# Patient Record
Sex: Female | Born: 1955 | Race: White | Hispanic: No | Smoking: Never smoker
Health system: Southern US, Community
[De-identification: ages and names within clinical notes are randomized; demographics above are authoritative.]

## PROBLEM LIST (undated history)

## (undated) ENCOUNTER — Emergency Department (HOSPITAL_COMMUNITY): Admission: EM | Source: Home / Self Care

## (undated) DIAGNOSIS — F32A Depression, unspecified: Secondary | ICD-10-CM

## (undated) DIAGNOSIS — K5790 Diverticulosis of intestine, part unspecified, without perforation or abscess without bleeding: Secondary | ICD-10-CM

## (undated) DIAGNOSIS — R32 Unspecified urinary incontinence: Secondary | ICD-10-CM

## (undated) DIAGNOSIS — D509 Iron deficiency anemia, unspecified: Secondary | ICD-10-CM

## (undated) DIAGNOSIS — F329 Major depressive disorder, single episode, unspecified: Secondary | ICD-10-CM

## (undated) DIAGNOSIS — R011 Cardiac murmur, unspecified: Secondary | ICD-10-CM

## (undated) DIAGNOSIS — K219 Gastro-esophageal reflux disease without esophagitis: Secondary | ICD-10-CM

## (undated) DIAGNOSIS — M1990 Unspecified osteoarthritis, unspecified site: Secondary | ICD-10-CM

## (undated) DIAGNOSIS — L4050 Arthropathic psoriasis, unspecified: Secondary | ICD-10-CM

## (undated) DIAGNOSIS — Z8489 Family history of other specified conditions: Secondary | ICD-10-CM

## (undated) DIAGNOSIS — R51 Headache: Secondary | ICD-10-CM

## (undated) DIAGNOSIS — F988 Other specified behavioral and emotional disorders with onset usually occurring in childhood and adolescence: Secondary | ICD-10-CM

## (undated) DIAGNOSIS — G4700 Insomnia, unspecified: Secondary | ICD-10-CM

## (undated) DIAGNOSIS — R519 Headache, unspecified: Secondary | ICD-10-CM

## (undated) DIAGNOSIS — F1011 Alcohol abuse, in remission: Secondary | ICD-10-CM

## (undated) DIAGNOSIS — F419 Anxiety disorder, unspecified: Secondary | ICD-10-CM

## (undated) HISTORY — DX: Iron deficiency anemia, unspecified: D50.9

## (undated) HISTORY — PX: TONSILLECTOMY: SUR1361

## (undated) HISTORY — DX: Arthropathic psoriasis, unspecified: L40.50

## (undated) HISTORY — DX: Diverticulosis of intestine, part unspecified, without perforation or abscess without bleeding: K57.90

## (undated) HISTORY — DX: Other specified behavioral and emotional disorders with onset usually occurring in childhood and adolescence: F98.8

## (undated) HISTORY — DX: Insomnia, unspecified: G47.00

## (undated) HISTORY — DX: Unspecified osteoarthritis, unspecified site: M19.90

---
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
## 1982-11-27 HISTORY — PX: CHOLECYSTECTOMY: SHX55

## 1999-10-25 ENCOUNTER — Other Ambulatory Visit: Admission: RE | Admit: 1999-10-25 | Discharge: 1999-10-25 | Payer: Self-pay | Admitting: Psychiatry

## 2000-01-04 ENCOUNTER — Other Ambulatory Visit: Admission: RE | Admit: 2000-01-04 | Discharge: 2000-01-04 | Payer: Self-pay | Admitting: *Deleted

## 2000-03-31 ENCOUNTER — Emergency Department (HOSPITAL_COMMUNITY): Admission: EM | Admit: 2000-03-31 | Discharge: 2000-03-31 | Payer: Self-pay | Admitting: Emergency Medicine

## 2000-11-27 HISTORY — PX: GASTRIC BYPASS: SHX52

## 2001-04-18 ENCOUNTER — Ambulatory Visit: Admission: RE | Admit: 2001-04-18 | Discharge: 2001-04-18 | Payer: Self-pay | Admitting: *Deleted

## 2001-04-18 ENCOUNTER — Other Ambulatory Visit: Admission: RE | Admit: 2001-04-18 | Discharge: 2001-04-18 | Payer: Self-pay | Admitting: Obstetrics and Gynecology

## 2002-05-07 ENCOUNTER — Other Ambulatory Visit: Admission: RE | Admit: 2002-05-07 | Discharge: 2002-05-07 | Payer: Self-pay | Admitting: Obstetrics and Gynecology

## 2003-05-29 ENCOUNTER — Other Ambulatory Visit: Admission: RE | Admit: 2003-05-29 | Discharge: 2003-05-29 | Payer: Self-pay | Admitting: Obstetrics and Gynecology

## 2003-11-28 HISTORY — PX: COLONOSCOPY: SHX174

## 2004-07-26 ENCOUNTER — Other Ambulatory Visit: Admission: RE | Admit: 2004-07-26 | Discharge: 2004-07-26 | Payer: Self-pay | Admitting: Obstetrics and Gynecology

## 2005-09-27 ENCOUNTER — Other Ambulatory Visit: Admission: RE | Admit: 2005-09-27 | Discharge: 2005-09-27 | Payer: Self-pay | Admitting: Obstetrics and Gynecology

## 2005-10-06 ENCOUNTER — Ambulatory Visit: Payer: Self-pay | Admitting: Internal Medicine

## 2005-11-27 HISTORY — PX: HYSTEROSCOPY W/D&C: SHX1775

## 2005-12-09 ENCOUNTER — Emergency Department (HOSPITAL_COMMUNITY): Admission: EM | Admit: 2005-12-09 | Discharge: 2005-12-10 | Payer: Self-pay | Admitting: Emergency Medicine

## 2005-12-12 ENCOUNTER — Encounter (HOSPITAL_COMMUNITY): Admission: RE | Admit: 2005-12-12 | Discharge: 2006-03-12 | Payer: Self-pay | Admitting: Emergency Medicine

## 2005-12-16 ENCOUNTER — Emergency Department (HOSPITAL_COMMUNITY): Admission: EM | Admit: 2005-12-16 | Discharge: 2005-12-16 | Payer: Self-pay | Admitting: *Deleted

## 2005-12-29 ENCOUNTER — Ambulatory Visit (HOSPITAL_COMMUNITY): Admission: RE | Admit: 2005-12-29 | Discharge: 2005-12-29 | Payer: Self-pay | Admitting: Obstetrics and Gynecology

## 2005-12-29 ENCOUNTER — Encounter (INDEPENDENT_AMBULATORY_CARE_PROVIDER_SITE_OTHER): Payer: Self-pay | Admitting: *Deleted

## 2006-03-09 ENCOUNTER — Ambulatory Visit: Payer: Self-pay | Admitting: Internal Medicine

## 2006-03-23 ENCOUNTER — Ambulatory Visit: Payer: Self-pay | Admitting: Internal Medicine

## 2006-03-23 ENCOUNTER — Encounter (INDEPENDENT_AMBULATORY_CARE_PROVIDER_SITE_OTHER): Payer: Self-pay | Admitting: *Deleted

## 2006-12-05 ENCOUNTER — Other Ambulatory Visit: Admission: RE | Admit: 2006-12-05 | Discharge: 2006-12-05 | Payer: Self-pay | Admitting: Obstetrics & Gynecology

## 2007-12-18 ENCOUNTER — Other Ambulatory Visit: Admission: RE | Admit: 2007-12-18 | Discharge: 2007-12-18 | Payer: Self-pay | Admitting: Family Medicine

## 2011-04-14 NOTE — H&P (Signed)
Jasmine Richard, Jasmine Richard              ACCOUNT NO.:  [id number]   MEDICAL RECORD NO.:  [id number]           PATIENT TYPE:   LOCATION:                               FACILITY:  [facility]   PHYSICIAN:  Guy Sandifer. Henderson Cloud, [name] DATE OF BIRTH:  October 09, 1956   DATE OF ADMISSION:  12/29/2005  DATE OF DISCHARGE:                                HISTORY & PHYSICAL   CHIEF COMPLAINT:  Irregular menses.   HISTORY OF PRESENT ILLNESS:  This patient is a 55 year old married, white  female, G3, P3, who has had irregular menses. Ultrasound on October 25, 2005 revealed uterus measuring 11.1 x 5.6 x 6.2 cm. Right ovary had a 2.1 x  2 cm simple cyst. Sonohysterogram revealed an 8 mm polypoid type mass in the  superior aspect of the endometrial cavity. After discussing the options she  is being admitted for hysteroscopy, D&C. Potential risks and complications  have been reviewed preoperatively.   PAST MEDICAL HISTORY:  1.  Menstrual migraines.  2.  Osteopenia.   PAST SURGICAL HISTORY:  1.  Bariatric surgery.  2.  Cholecystectomy in 1984.  3.  Tonsillectomy in 1960.   OBSTETRIC HISTORY:  Vaginal delivery x1. Cesarean section x2.   FAMILY HISTORY:  Crohn's disease in mother. Cervical cancer in mother.  Tongue cancer in father. Chronic hypertension in father.   MEDICATIONS:  Mobick, Glucosamine, Prilosec, vitamin B12 injections.   ALLERGIES:  No known drug allergies.   SOCIAL HISTORY:  Denies tobacco, alcohol or drug abuse.   REVIEW OF SYSTEMS:  CARDIAC:  Denies chest pain. NEURO:  Denies headache  except for migraine as above. PULMONARY:  Denies chest pain. GI:  Denies  recent change in bowel habits.   PHYSICAL EXAMINATION:  VITAL SIGNS:  Height 5 feet, 5 inches, weight 195  pounds. Blood pressure 100/60.  HEENT:  Without thyromegaly.  LUNGS:  Clear to auscultation.  HEART:  Regular rate and rhythm.  BACK:  Without CVA tenderness.  BREASTS:  Without mass, retraction, discharge.  ABDOMEN:  Soft,  nontender without masses.  PELVIC EXAM:  Vagina, cervix without lesions. Uterus anteverted, upper  normal size, mobile, nontender. Adnexa nontender without masses.  NEUROLOGIC EXAM:  Grossly within normal limits.  EXTREMITIES:  Grossly within normal limits.   ASSESSMENT:  Irregular menses.   PLAN:  Hysteroscopy, D&C.      Guy Sandifer Henderson Cloud, [name]  Electronically Signed     JET/MEDQ  D:  12/27/2005  T:  12/27/2005  Job:  [id number]

## 2011-04-14 NOTE — Op Note (Signed)
Jasmine Richard, Jasmine Richard              ACCOUNT NO.:  [id number]   MEDICAL RECORD NO.:  [id number]          PATIENT TYPE:  AMB   LOCATION:  SDC                           FACILITY:  [facility]   PHYSICIAN:  Guy Sandifer. Henderson Cloud, [name] DATE OF BIRTH:  1956-03-28   DATE OF PROCEDURE:  12/29/2005  DATE OF DISCHARGE:                                 OPERATIVE REPORT   PREOPERATIVE DIAGNOSIS:  Irregular menses.   POSTOPERATIVE DIAGNOSIS:  Irregular menses.  Same.   PROCEDURE:  1  Hysteroscopy with dilatation curettage.  2  1% Xylocaine paracervical block.   SURGEON:  Harold Hedge, MD   ANESTHESIA:  General with LMA.   SPECIMENS:  Endometrial curettings   ESTIMATED BLOOD LOSS:  Minimal.   INDICATIONS AND CONSENT:  The patient is a 55 year old married white female  G3, P3 with irregular menses.  Details dictated in the history and physical.  Hysteroscopy, dilatation curettage has been discussed preoperatively.  Potential risks and complications are reviewed preoperatively including but  limited to infection, uterine perforation and organ damage, bleeding  requiring transfusion of blood products and possible transfusion reaction,  HIV and  hepatitis acquisition, DVT, PE, pneumonia, hysterectomy, recurrent  abnormal bleeding.  All consents are signed and on the chart.   FINDINGS:  Uterine cavity is normal in contour.  No abnormal structures are  noted.  Fallopian tube ostia are identified bilaterally.   PROCEDURE:  The patient is taken to the operating room where she is  identified, placed in dorsosupine position, and general anesthesia via LMA  is induced.  She is then placed in the dorsal lithotomy position where she  is prepped, bladder straight catheterized, and draped in a sterile fashion.  Bivalve speculum is placed in the vagina.  The anterior cervical lip is  injected with 1% Xylocaine and grasped with single-tooth tenaculum.  Paracervical block is placed to in the 2, 4, 5, 7, 8 and 10  o'clock  positions with approximately 20 mL total of 1% Xylocaine.  The cervix is  then gently progressively dilated to 29 dilator.  Diagnostic hysteroscope is  placed in the endocervical canal and advanced under direct visualization  using sorbitol distending media.  The above findings are noted.  Hysteroscope is  withdrawn.  Sharp curettage was carried out.  Reinspection with the  hysteroscope again reveals the cavity to be clean and no abnormal  structures. I&O of sorbitol distending media are 160 mL deficit with part of  that being on the floor; all counts correct.  The patient is awakened, taken  to recovery room in stable condition.      Guy Sandifer Henderson Cloud, [name]  Electronically Signed     JET/MEDQ  D:  12/29/2005  T:  12/29/2005  Job:  [id number]

## 2012-11-25 ENCOUNTER — Encounter (HOSPITAL_BASED_OUTPATIENT_CLINIC_OR_DEPARTMENT_OTHER): Payer: Self-pay | Admitting: *Deleted

## 2012-11-25 NOTE — Progress Notes (Signed)
Pt was widowed 8/13 Daughter to come with her No cardiac or resp problems

## 2012-11-28 ENCOUNTER — Encounter (HOSPITAL_BASED_OUTPATIENT_CLINIC_OR_DEPARTMENT_OTHER): Payer: Self-pay

## 2012-11-28 ENCOUNTER — Ambulatory Visit (HOSPITAL_BASED_OUTPATIENT_CLINIC_OR_DEPARTMENT_OTHER): Admitting: Anesthesiology

## 2012-11-28 ENCOUNTER — Encounter (HOSPITAL_BASED_OUTPATIENT_CLINIC_OR_DEPARTMENT_OTHER)
Admission: RE | Disposition: A | Discharge status: Home / Self Care | Payer: Self-pay | Source: Ambulatory Visit | Attending: Orthopedic Surgery

## 2012-11-28 ENCOUNTER — Ambulatory Visit (HOSPITAL_BASED_OUTPATIENT_CLINIC_OR_DEPARTMENT_OTHER)
Admission: RE | Admit: 2012-11-28 | Discharge: 2012-11-28 | Disposition: A | Discharge status: Home / Self Care | Source: Ambulatory Visit | Attending: Orthopedic Surgery | Admitting: Orthopedic Surgery

## 2012-11-28 ENCOUNTER — Encounter (HOSPITAL_BASED_OUTPATIENT_CLINIC_OR_DEPARTMENT_OTHER): Payer: Self-pay | Admitting: Anesthesiology

## 2012-11-28 DIAGNOSIS — M19071 Primary osteoarthritis, right ankle and foot: Secondary | ICD-10-CM

## 2012-11-28 DIAGNOSIS — K219 Gastro-esophageal reflux disease without esophagitis: Secondary | ICD-10-CM | POA: Insufficient documentation

## 2012-11-28 DIAGNOSIS — M19079 Primary osteoarthritis, unspecified ankle and foot: Secondary | ICD-10-CM | POA: Insufficient documentation

## 2012-11-28 HISTORY — DX: Gastro-esophageal reflux disease without esophagitis: K21.9

## 2012-11-28 HISTORY — PX: TARSAL METATARSAL ARTHRODESIS: SHX2481

## 2012-11-28 LAB — POCT HEMOGLOBIN-HEMACUE: Hemoglobin: 15.1 g/dL — ABNORMAL HIGH (ref 12.0–15.0)

## 2012-11-28 SURGERY — FUSION, TARSOMETATARSAL JOINT
Anesthesia: Regional | Site: Foot | Laterality: Right | Wound class: Clean

## 2012-11-28 MED ORDER — FENTANYL CITRATE 0.05 MG/ML IJ SOLN
50.0000 ug | Freq: Once | INTRAMUSCULAR | Status: AC
  Administered 2012-11-28: 100 ug via INTRAVENOUS

## 2012-11-28 MED ORDER — METAXALONE 800 MG PO TABS: 800.0000 mg | ORAL_TABLET | Freq: Three times a day (TID) | ORAL | Status: DC

## 2012-11-28 MED ORDER — CEFAZOLIN SODIUM-DEXTROSE 2-3 GM-% IV SOLR
2.0000 g | INTRAVENOUS | Status: AC
  Administered 2012-11-28: 2 g via INTRAVENOUS

## 2012-11-28 MED ORDER — OXYCODONE-ACETAMINOPHEN 5-325 MG PO TABS: 1.0000 | ORAL_TABLET | ORAL | Status: DC | PRN

## 2012-11-28 MED ORDER — PROPOFOL 10 MG/ML IV BOLUS
INTRAVENOUS | Status: DC | PRN
  Administered 2012-11-28: 250 mg via INTRAVENOUS
  Administered 2012-11-28: 50 mg via INTRAVENOUS

## 2012-11-28 MED ORDER — VITAMIN C 500 MG PO TABS: 500.0000 mg | ORAL_TABLET | Freq: Every day | ORAL | Status: DC

## 2012-11-28 MED ORDER — MIDAZOLAM HCL 2 MG/2ML IJ SOLN: 1.0000 mg | INTRAMUSCULAR | Status: DC | PRN

## 2012-11-28 MED ORDER — OXYCODONE HCL 5 MG PO TABS: 5.0000 mg | ORAL_TABLET | Freq: Once | ORAL | Status: DC | PRN

## 2012-11-28 MED ORDER — ASPIRIN EC 325 MG PO TBEC: 325.0000 mg | DELAYED_RELEASE_TABLET | Freq: Two times a day (BID) | ORAL | Status: DC

## 2012-11-28 MED ORDER — MIDAZOLAM HCL 2 MG/2ML IJ SOLN
1.0000 mg | INTRAMUSCULAR | Status: DC | PRN
  Administered 2012-11-28: 2 mg via INTRAVENOUS

## 2012-11-28 MED ORDER — SODIUM CHLORIDE 0.9 % IV SOLN: INTRAVENOUS | Status: DC

## 2012-11-28 MED ORDER — BUPIVACAINE-EPINEPHRINE PF 0.5-1:200000 % IJ SOLN
INTRAMUSCULAR | Status: DC | PRN
  Administered 2012-11-28: 30 mL

## 2012-11-28 MED ORDER — FENTANYL CITRATE 0.05 MG/ML IJ SOLN
INTRAMUSCULAR | Status: DC | PRN
  Administered 2012-11-28: 50 ug via INTRAVENOUS
  Administered 2012-11-28 (×2): 25 ug via INTRAVENOUS

## 2012-11-28 MED ORDER — OXYCODONE HCL 5 MG/5ML PO SOLN: 5.0000 mg | Freq: Once | ORAL | Status: DC | PRN

## 2012-11-28 MED ORDER — PROMETHAZINE HCL 25 MG/ML IJ SOLN: 6.2500 mg | INTRAMUSCULAR | Status: DC | PRN

## 2012-11-28 MED ORDER — ONDANSETRON HCL 4 MG/2ML IJ SOLN
INTRAMUSCULAR | Status: DC | PRN
  Administered 2012-11-28: 4 mg via INTRAVENOUS

## 2012-11-28 MED ORDER — DEXAMETHASONE SODIUM PHOSPHATE 4 MG/ML IJ SOLN
INTRAMUSCULAR | Status: DC | PRN
  Administered 2012-11-28: 10 mg via INTRAVENOUS

## 2012-11-28 MED ORDER — LIDOCAINE HCL (CARDIAC) 20 MG/ML IV SOLN
INTRAVENOUS | Status: DC | PRN
  Administered 2012-11-28: 50 mg via INTRAVENOUS

## 2012-11-28 MED ORDER — HYDROMORPHONE HCL PF 1 MG/ML IJ SOLN
0.2500 mg | INTRAMUSCULAR | Status: DC | PRN
  Administered 2012-11-28 (×2): 0.5 mg via INTRAVENOUS

## 2012-11-28 MED ORDER — CHLORHEXIDINE GLUCONATE 4 % EX LIQD
60.0000 mL | Freq: Once | CUTANEOUS | Status: DC
Start: 2012-11-28 — End: 2012-11-28

## 2012-11-28 MED ORDER — ENOXAPARIN SODIUM 30 MG/0.3ML ~~LOC~~ SOLN: 30.0000 mg | Freq: Two times a day (BID) | SUBCUTANEOUS | Status: DC

## 2012-11-28 MED ORDER — FENTANYL CITRATE 0.05 MG/ML IJ SOLN: 50.0000 ug | INTRAMUSCULAR | Status: DC | PRN

## 2012-11-28 MED ORDER — LACTATED RINGERS IV SOLN
INTRAVENOUS | Status: DC
  Administered 2012-11-28 (×3): via INTRAVENOUS

## 2012-11-28 SURGICAL SUPPLY — 75 items
BANDAGE ELASTIC 4 VELCRO ST LF (GAUZE/BANDAGES/DRESSINGS) ×2 IMPLANT
BANDAGE ELASTIC 6 VELCRO ST LF (GAUZE/BANDAGES/DRESSINGS) ×2 IMPLANT
BIT DRILL CANN F/COMP 2.2 (BIT) ×2 IMPLANT
BLADE CCA MICRO SAG (BLADE) ×2 IMPLANT
BLADE OSC/SAG .038X5.5 CUT EDG (BLADE) IMPLANT
BLADE SURG 11 STRL SS (BLADE) ×2 IMPLANT
BLADE SURG 15 STRL LF DISP TIS (BLADE) ×2 IMPLANT
BLADE SURG 15 STRL SS (BLADE) ×2
BRUSH SCRUB EZ PLAIN DRY (MISCELLANEOUS) ×2 IMPLANT
BUR EGG 3PK/BX (BURR) IMPLANT
CLOTH BEACON ORANGE TIMEOUT ST (SAFETY) ×2 IMPLANT
COTTON STERILE ROLL (GAUZE/BANDAGES/DRESSINGS) ×2 IMPLANT
COVER TABLE BACK 60X90 (DRAPES) ×2 IMPLANT
CUFF TOURNIQUET SINGLE 34IN LL (TOURNIQUET CUFF) ×2 IMPLANT
DRAPE EXTREMITY T 121X128X90 (DRAPE) ×2 IMPLANT
DRAPE OEC MINIVIEW 54X84 (DRAPES) ×2 IMPLANT
DRAPE SURG 17X23 STRL (DRAPES) ×2 IMPLANT
DRSG PAD ABDOMINAL 8X10 ST (GAUZE/BANDAGES/DRESSINGS) ×2 IMPLANT
DURA STEPPER LG (CAST SUPPLIES) IMPLANT
DURA STEPPER MED (CAST SUPPLIES) IMPLANT
DURA STEPPER SML (CAST SUPPLIES) IMPLANT
ELECT REM PT RETURN 9FT ADLT (ELECTROSURGICAL) ×2
ELECTRODE REM PT RTRN 9FT ADLT (ELECTROSURGICAL) ×1 IMPLANT
GAUZE SPONGE 4X4 16PLY XRAY LF (GAUZE/BANDAGES/DRESSINGS) ×2 IMPLANT
GAUZE XEROFORM 1X8 LF (GAUZE/BANDAGES/DRESSINGS) ×2 IMPLANT
GAUZE XEROFORM 5X9 LF (GAUZE/BANDAGES/DRESSINGS) IMPLANT
GLOVE BIO SURGEON STRL SZ7.5 (GLOVE) ×2 IMPLANT
GLOVE BIO SURGEON STRL SZ8 (GLOVE) ×2 IMPLANT
GLOVE BIOGEL PI IND STRL 8 (GLOVE) ×2 IMPLANT
GLOVE BIOGEL PI INDICATOR 8 (GLOVE) ×2
GLOVE SKINSENSE NS SZ7.0 (GLOVE) ×1
GLOVE SKINSENSE STRL SZ7.0 (GLOVE) ×1 IMPLANT
GLOVE SURG SS PI 8.0 STRL IVOR (GLOVE) ×2 IMPLANT
GOWN BRE IMP PREV XXLGXLNG (GOWN DISPOSABLE) ×2 IMPLANT
GOWN PREVENTION PLUS XLARGE (GOWN DISPOSABLE) ×2 IMPLANT
GOWN STRL REIN XL XLG (GOWN DISPOSABLE) ×2 IMPLANT
IMPLANT OP-1 (Orthopedic Implant) ×2 IMPLANT
NDL SUT 6 .5 CRC .975X.05 MAYO (NEEDLE) IMPLANT
NEEDLE HYPO 22GX1.5 SAFETY (NEEDLE) IMPLANT
NEEDLE MAYO TAPER (NEEDLE)
NS IRRIG 1000ML POUR BTL (IV SOLUTION) ×2 IMPLANT
PACK BASIN DAY SURGERY FS (CUSTOM PROCEDURE TRAY) ×2 IMPLANT
PAD CAST 4YDX4 CTTN HI CHSV (CAST SUPPLIES) ×1 IMPLANT
PADDING CAST ABS 4INX4YD NS (CAST SUPPLIES) ×1
PADDING CAST ABS COTTON 4X4 ST (CAST SUPPLIES) ×1 IMPLANT
PADDING CAST COTTON 4X4 STRL (CAST SUPPLIES) ×1
PENCIL BUTTON HOLSTER BLD 10FT (ELECTRODE) ×2 IMPLANT
PLATE LP STR 3.0MM 2H (Plate) ×2 IMPLANT
PLATE LP STR 3.0MM 4H (Plate) ×2 IMPLANT
SCREW CORTICAL 3MMX16MM (Screw) ×2 IMPLANT
SCREW LOCKING 3MMX12MM (Screw) ×2 IMPLANT
SCREW LP CORT 3.0X20 (Screw) ×2 IMPLANT
SCREW LP CORT 3.0X28MM (Screw) ×2 IMPLANT
SCREW LP CORT 3.0X34MM (Screw) ×2 IMPLANT
SCREW LP CORT 3.0X36MM (Screw) ×2 IMPLANT
SCREW LP LOCK 3.0X36MM (Screw) ×2 IMPLANT
SHEET MEDIUM DRAPE 40X70 STRL (DRAPES) ×2 IMPLANT
SPLINT FAST PLASTER 5X30 (CAST SUPPLIES) ×1
SPLINT PLASTER CAST FAST 5X30 (CAST SUPPLIES) ×1 IMPLANT
SPONGE GAUZE 4X4 12PLY (GAUZE/BANDAGES/DRESSINGS) ×2 IMPLANT
STOCKINETTE 6  STRL (DRAPES) ×1
STOCKINETTE 6 STRL (DRAPES) ×1 IMPLANT
SUCTION FRAZIER TIP 10 FR DISP (SUCTIONS) ×2 IMPLANT
SUT ETHILON 4 0 PS 2 18 (SUTURE) ×2 IMPLANT
SUT VIC AB 2-0 PS2 27 (SUTURE) IMPLANT
SUT VIC AB 3-0 PS1 18 (SUTURE) ×1
SUT VIC AB 3-0 PS1 18XBRD (SUTURE) ×1 IMPLANT
SYR BULB 3OZ (MISCELLANEOUS) ×2 IMPLANT
SYR CONTROL 10ML LL (SYRINGE) IMPLANT
TOWEL OR 17X24 6PK STRL BLUE (TOWEL DISPOSABLE) ×2 IMPLANT
TOWEL OR 17X26 10 PK STRL BLUE (TOWEL DISPOSABLE) ×2 IMPLANT
TOWEL OR NON WOVEN STRL DISP B (DISPOSABLE) ×2 IMPLANT
TUBE CONNECTING 20X1/4 (TUBING) ×2 IMPLANT
UNDERPAD 30X30 INCONTINENT (UNDERPADS AND DIAPERS) ×2 IMPLANT
WATER STERILE IRR 1000ML POUR (IV SOLUTION) ×2 IMPLANT

## 2012-11-28 NOTE — Progress Notes (Signed)
Assisted Dr. Hodierne with right, ultrasound guided, femoral nerve block. Side rails up, monitors on throughout procedure. See vital signs in flow sheet. Tolerated Procedure well. 

## 2012-11-28 NOTE — Transfer of Care (Signed)
Immediate Anesthesia Transfer of Care Note  Patient: Jasmine Richard  Procedure(s) Performed: Procedure(s) (LRB) with comments: TARSAL METATARSAL FUSION (Right) - right 2nd and 3rd TMT joint fusion local bone graft stress x ray foot   Patient Location: PACU  Anesthesia Type:GA combined with regional for post-op pain  Level of Consciousness: awake, alert  and oriented  Airway & Oxygen Therapy: Patient Spontanous Breathing and Patient connected to face mask oxygen  Post-op Assessment: Report given to PACU RN and Post -op Vital signs reviewed and stable  Post vital signs: Reviewed and stable  Complications: No apparent anesthesia complications

## 2012-11-28 NOTE — Anesthesia Postprocedure Evaluation (Signed)
Anesthesia Post Note  Patient: Jasmine Richard  Procedure(s) Performed: Procedure(s) (LRB): TARSAL METATARSAL FUSION (Right)  Anesthesia type: General  Patient location: PACU  Post pain: Pain level controlled and Adequate analgesia  Post assessment: Post-op Vital signs reviewed, Patient's Cardiovascular Status Stable, Respiratory Function Stable, Patent Airway and Pain level controlled  Last Vitals:  Filed Vitals:   11/28/12 1645  BP: 122/59  Pulse: 85  Temp:   Resp: 15    Post vital signs: Reviewed and stable  Level of consciousness: awake, alert  and oriented  Complications: No apparent anesthesia complications

## 2012-11-28 NOTE — Anesthesia Preprocedure Evaluation (Signed)
Anesthesia Evaluation  Patient identified by MRN, date of birth, ID band Patient awake    Reviewed: Allergy & Precautions, H&P , NPO status , Patient's Chart, lab work & pertinent test results  Airway Mallampati: I TM Distance: >3 FB Neck ROM: Full    Dental   Pulmonary  breath sounds clear to auscultation        Cardiovascular Rhythm:Regular Rate:Normal     Neuro/Psych    GI/Hepatic GERD-  ,S/p gastric bypass   Endo/Other    Renal/GU      Musculoskeletal   Abdominal   Peds  Hematology   Anesthesia Other Findings   Reproductive/Obstetrics                           Anesthesia Physical Anesthesia Plan  ASA: II  Anesthesia Plan: General   Post-op Pain Management:    Induction: Intravenous  Airway Management Planned: LMA  Additional Equipment:   Intra-op Plan:   Post-operative Plan: Extubation in OR  Informed Consent: I have reviewed the patients History and Physical, chart, labs and discussed the procedure including the risks, benefits and alternatives for the proposed anesthesia with the patient or authorized representative who has indicated his/her understanding and acceptance.     Plan Discussed with: CRNA and Surgeon  Anesthesia Plan Comments:         Anesthesia Quick Evaluation

## 2012-11-28 NOTE — Brief Op Note (Signed)
11/28/2012  3:58 PM  PATIENT:  Jasmine Richard  57 y.o. 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  PROCEDURE:  Procedure(s) (LRB) with comments: TARSAL METATARSAL FUSION (Right) - right 2nd and 3rd TMT joint fusion local bone graft stress x ray foot   SURGEON:  Surgeon(s) and Role:    * Sherri Rad, MD - Primary  PHYSICIAN ASSISTANT: Rexene Edison, PAC   ASSISTANTS: Rexene Edison, [facility]    ANESTHESIA:   general  EBL:  Total I/O In: 1000 [I.V.:1000] Out: -   BLOOD ADMINISTERED:none  DRAINS: none   LOCAL MEDICATIONS USED:  NONE  SPECIMEN:  No Specimen  DISPOSITION OF SPECIMEN:  N/A  COUNTS:  YES  TOURNIQUET:   Total Tourniquet Time Documented: Thigh (Right) - 95 minutes  DICTATION: .Other Dictation: Dictation Number [id number]  PLAN OF CARE: Discharge to home after PACU  PATIENT DISPOSITION:  PACU - hemodynamically stable.   Delay start of Pharmacological VTE agent (>24hrs) due to surgical blood loss or risk of bleeding: no

## 2012-11-28 NOTE — Anesthesia Procedure Notes (Addendum)
Anesthesia Regional Block:  Popliteal block  Pre-Anesthetic Checklist: ,, timeout performed, Correct Patient, Correct Site, Correct Laterality, Correct Procedure, Correct Position, site marked, Risks and benefits discussed,  Surgical consent,  Pre-op evaluation,  At surgeon's request and post-op pain management  Laterality: Right  Prep: chloraprep       Needles:  Injection technique: Single-shot  Needle Type: Echogenic Stimulator Needle          Additional Needles:  Procedures: ultrasound guided (picture in chart) and nerve stimulator Popliteal block  Nerve Stimulator or Paresthesia:  Response: plantar flexion of foot, 0.45 mA,   Additional Responses:   Narrative:  Start time: 11/28/2012 12:59 PM End time: 11/28/2012 1:13 PM Injection made incrementally with aspirations every 5 mL.  Performed by: Personally  Anesthesiologist: Dr Chaney Malling  Additional Notes: Functioning IV was confirmed and monitors were applied.  A 90mm 21ga Arrow echogenic stimulator needle was used. Sterile prep and drape,hand hygiene and sterile gloves were used.  Negative aspiration and negative test dose prior to incremental administration of local anesthetic. The patient tolerated the procedure well.  Ultrasound guidance: relevent anatomy identified, needle position confirmed, local anesthetic spread visualized around nerve(s), vascular puncture avoided.  Image printed for medical record.   Popliteal block Procedure Name: LMA Insertion Date/Time: 11/28/2012 1:38 PM Performed by: Gar Gibbon Pre-anesthesia Checklist: Patient identified, Emergency Drugs available, Suction available and Patient being monitored Patient Re-evaluated:Patient Re-evaluated prior to inductionOxygen Delivery Method: Circle System Utilized Preoxygenation: Pre-oxygenation with 100% oxygen Intubation Type: IV induction Ventilation: Mask ventilation without difficulty LMA: LMA inserted LMA Size: 4.0 Number of attempts:  1 Airway Equipment and Method: bite block Placement Confirmation: positive ETCO2 Tube secured with: Tape Dental Injury: Teeth and Oropharynx as per pre-operative assessment

## 2012-11-28 NOTE — H&P (Signed)
  H&P documentation: Placed to be scanned history and physical exam in chart.  -History and Physical Reviewed  -Patient has been re-examined  -No change in the plan of care  Jasmine Richard A  

## 2012-11-29 NOTE — Addendum Note (Signed)
Addendum  created 11/29/12 0827 by Jewel Baize Lalla Laham, CRNA   Modules edited:Charges VN

## 2012-11-29 NOTE — Op Note (Signed)
Jasmine Richard, Jasmine Richard              ACCOUNT NO.:  [id number]  MEDICAL RECORD NO.:  [id number]  LOCATION:                                 FACILITY:  PHYSICIAN:  Leonides Grills, [name]     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  OPERATION: 1. Right second and third tarsometatarsal joint fusions. 2. Right local bone graft. 3. Stress x-rays of right foot.  ANESTHESIA:  General.  SURGEON:  Leonides Grills, [name]  ASSISTANT:  Richardean Canal, P.A.  ESTIMATED BLOOD LOSS:  Minimal.  TOURNIQUET:  95 minutes.  COMPLICATIONS:  None.  DISPOSITION:  Stable to PR.  INDICATIONS:  This is a 57 year old female who has had long-standing right midfoot pain, due to the above pathology that was interfering with her life to the point where she could not do what she wanted to do despite conservative management.  She was consented for the above procedure.  All risks of infection, nerve or vessel injury, nonunion, malunion, hardware irritation, hardware failure, persistent pain, worsening pain, prolonged recovery, stiffness, arthritis, wound healing problems, DVT, PE were all explained.  Questions were encouraged and answered.  OPERATION:  The patient was brought to the operating room and placed in supine position after adequate general anesthesia was administered as well as Ancef 1 g IV piggyback.  Right lower extremity was then prepped and draped in sterile manner over a proximally placed thigh tourniquet. Limb was gravity exsanguinated and tourniquet was elevated at 290 mmHg. A longitudinal incision in the middle between the second and third TMT joints was then made, dissection was carried down through the skin. Hemostasis was obtained.   Dorsalis pedis and deep peroneal nerve were carefully retracted out of harm's way throughout the case medially. Second and third TMT joints were then identified under C-arm guidance before there was a large spur dorsally.  We then removed the spur with curved corners osteotome and placed this on the back table with local bone graft.  We then entered the second and third TMT joints respectively.  These were then debrided of remaining cartilage and scar tissue with a curved corners osteotome, rongeur, and a curette.  Once this was completely denuded, multiple 2-mm drill holes were placed on either side of the joint.  OP1 synthetic graft was then applied to the joint as well as local bone graft obtained throughout the procedure.  We then used a two-point reduction clamp and reduced the second TMT joint. Under compression, we then placed a four-hole Arthrex plate on the dorsal aspect of the joint.  We then sequentially applied nonlocking and locking screws into the plate.  This was a titanium plate and screw set. We then obtained stress x-ray in AP, lateral and oblique planes.  This showed no gross of motion, fixation, proposition and excellent alignment as well.  We then applied a two-hole Arthrex plate over the dorsal aspect of the third TMT joint, and then again placed two nonlocking screws in compression.  After two-point reduction clamp was applied and compression was applied across the joint.  Again, OP1 and local bone graft was applied to the joint as well.  Once the plate and screws were applied, we then obtained stress x-rays in AP, lateral and oblique planes.  This showed no gross of motion, fixation, proposition and excellent alignment as well.  Bone graft was then applied into the first and second TMT joints.  Tourniquet was deflated.  Hemostasis was obtained.  There was no pulsatile bleeding.  Subcu was closed with 4-0 PDS, skin was closed with 4-0 nylon.  Sterile dressing was  applied. Modified Jones dressing was applied with the ankle in neutral dorsiflexion.  The patient was stable to the PR.     Leonides Grills, [name]     PB/MEDQ  D:  11/28/2012  T:  11/28/2012  Job:  [id number]

## 2012-12-03 ENCOUNTER — Encounter (HOSPITAL_BASED_OUTPATIENT_CLINIC_OR_DEPARTMENT_OTHER): Payer: Self-pay | Admitting: Orthopedic Surgery

## 2013-02-01 ENCOUNTER — Ambulatory Visit (INDEPENDENT_AMBULATORY_CARE_PROVIDER_SITE_OTHER): Admitting: Internal Medicine

## 2013-02-01 VITALS — BP 108/65 | HR 88 | Temp 98.6°F | Resp 18 | Wt 165.0 lb

## 2013-02-01 DIAGNOSIS — Z9884 Bariatric surgery status: Secondary | ICD-10-CM

## 2013-02-01 DIAGNOSIS — K219 Gastro-esophageal reflux disease without esophagitis: Secondary | ICD-10-CM | POA: Insufficient documentation

## 2013-02-01 DIAGNOSIS — L4050 Arthropathic psoriasis, unspecified: Secondary | ICD-10-CM

## 2013-02-01 DIAGNOSIS — D7589 Other specified diseases of blood and blood-forming organs: Secondary | ICD-10-CM

## 2013-02-01 DIAGNOSIS — G4700 Insomnia, unspecified: Secondary | ICD-10-CM | POA: Insufficient documentation

## 2013-02-01 DIAGNOSIS — R197 Diarrhea, unspecified: Secondary | ICD-10-CM

## 2013-02-01 LAB — POCT CBC
Lymph, poc: 2.2 (ref 0.6–3.4)
MCH, POC: 33.6 pg — AB (ref 27–31.2)
MCHC: 32.3 g/dL (ref 31.8–35.4)
MID (cbc): 0.7 (ref 0–0.9)
MPV: 8.9 fL (ref 0–99.8)
POC Granulocyte: 2.2 (ref 2–6.9)
POC LYMPH PERCENT: 43.7 %L (ref 10–50)
POC MID %: 12.8 %M — AB (ref 0–12)
Platelet Count, POC: 274 10*3/uL (ref 142–424)
RDW, POC: 13 %
WBC: 5.1 10*3/uL (ref 4.6–10.2)

## 2013-02-01 LAB — POCT SEDIMENTATION RATE: POCT SED RATE: 26 mm/hr — AB (ref 0–22)

## 2013-02-01 MED ORDER — CIPROFLOXACIN HCL 500 MG PO TABS: 500.0000 mg | ORAL_TABLET | Freq: Two times a day (BID) | ORAL | Status: DC

## 2013-02-01 NOTE — Progress Notes (Signed)
  Subjective:    Patient ID: Jasmine Richard, female    DOB: Jul 03, 1956, 57 y.o.   MRN: [id number]  HPI complaining of diarrhea preceded by cramps for the last 3-1/2 days/yesterday was hourly with episodes of incontinence requiring diapers/chills and sweats but unsure if fever No excessive abdominal pain/no nausea and vomiting/queasy with decreased appetite/fluid intake good/no blood in bowel movements Colonoscopy 8 years ago within normal limits No GU symptoms No change in meds No unusual foods No travel  Past history-foot surgery January Dr. Lestine Box Recovering well Psoriatic arthritis-on humira Status post gastric bypass 2002-B12 injections every 2 weeks Husband died in August 2013/anxiety and reactive diarrhea for 4 months but stabilized in December   Review of Systems Night sweats thought secondary to menopause Fell on Monday bruising left arm and left anterior ribs near the right upper quadrant No shortness of breath/no cough/no palpitations    Objective:   Physical Exam BP 108/65  Pulse 88  Temp(Src) 98.6 F (37 C) (Oral)  Resp 18  Wt 165 lb (74.844 kg)  BMI 26.64 kg/m2 No acute distress Lungs clear Heart regular without murmur Slightly tender over the left anterior ribs without bruising Left arm with bruising over the triceps Abdomen soft/bowel sounds quiet No organomegaly or masses Tender in the left lower quadrant/no rebound tenderness  Results for orders placed in visit on 02/01/13  POCT CBC      Result Value Range   WBC 5.1  4.6 - 10.2 K/uL   Lymph, poc 2.2  0.6 - 3.4   POC LYMPH PERCENT 43.7  10 - 50 %L   MID (cbc) 0.7  0 - 0.9   POC MID % 12.8 (*) 0 - 12 %M   POC Granulocyte 2.2  2 - 6.9   Granulocyte percent 43.5  37 - 80 %G   RBC 4.52  4.04 - 5.48 M/uL   Hemoglobin 15.2  12.2 - 16.2 g/dL   HCT, POC 52.8  41.3 - 47.9 %   MCV 104.1 (*) 80 - 97 fL   MCH, POC 33.6 (*) 27 - 31.2 pg   MCHC 32.3  31.8 - 35.4 g/dL   RDW, POC 24.4     Platelet Count,  POC 274  142 - 424 K/uL   MPV 8.9  0 - 99.8 fL       Assessment & Plan:  Problem #1 diarrhea-infectious  Stool culture Cipro 500 twice a day #6 Foci for Imodium  Problem #2 macrocytosis without anemia Has had to miss B12 shots due to surgery and weather-will check labs

## 2013-02-03 LAB — COMPREHENSIVE METABOLIC PANEL
ALT: 13 U/L (ref 0–35)
AST: 19 U/L (ref 0–37)
Alkaline Phosphatase: 67 U/L (ref 39–117)
BUN: 8 mg/dL (ref 6–23)
Calcium: 9.9 mg/dL (ref 8.4–10.5)
Chloride: 99 mEq/L (ref 96–112)
Creat: 0.65 mg/dL (ref 0.50–1.10)
Total Bilirubin: 0.4 mg/dL (ref 0.3–1.2)

## 2013-02-05 LAB — FOLATE: Folate: >20 ng/mL

## 2013-02-05 LAB — TSH: TSH: 2.15 u[IU]/mL (ref 0.350–4.500)

## 2013-04-29 ENCOUNTER — Other Ambulatory Visit: Payer: Self-pay | Admitting: *Deleted

## 2013-04-29 NOTE — Telephone Encounter (Signed)
Faxed refill request received from pharmacy for [facility] 10 Last filled by MD on 09/30/12, #30 X 5 Last AEX - 09/30/12 Next AEX - 10/02/13  Please advise refills.  Thanks.  Chart in your rack.

## 2013-04-29 NOTE — Telephone Encounter (Signed)
OK to refill Rx X 6 more months.

## 2013-04-30 MED ORDER — ZOLPIDEM TARTRATE 10 MG PO TABS: 10.0000 mg | ORAL_TABLET | Freq: Every evening | ORAL | Status: DC | PRN

## 2013-04-30 NOTE — Telephone Encounter (Signed)
RX called to [place] at [facility].

## 2013-08-06 ENCOUNTER — Other Ambulatory Visit: Payer: Self-pay | Admitting: Orthopedic Surgery

## 2013-08-06 DIAGNOSIS — M79671 Pain in right foot: Secondary | ICD-10-CM

## 2013-08-07 ENCOUNTER — Other Ambulatory Visit

## 2013-08-08 ENCOUNTER — Ambulatory Visit
Admission: RE | Admit: 2013-08-08 | Discharge: 2013-08-08 | Disposition: A | Discharge status: Home / Self Care | Source: Ambulatory Visit | Attending: Orthopedic Surgery | Admitting: Orthopedic Surgery

## 2013-08-08 DIAGNOSIS — M79671 Pain in right foot: Secondary | ICD-10-CM

## 2013-10-02 ENCOUNTER — Ambulatory Visit: Admitting: Nurse Practitioner

## 2013-10-16 ENCOUNTER — Encounter: Payer: Self-pay | Admitting: Nurse Practitioner

## 2013-10-16 ENCOUNTER — Ambulatory Visit (INDEPENDENT_AMBULATORY_CARE_PROVIDER_SITE_OTHER): Admitting: Nurse Practitioner

## 2013-10-16 VITALS — BP 112/60 | HR 74 | Resp 16 | Ht 64.25 in | Wt 165.0 lb

## 2013-10-16 DIAGNOSIS — Z01419 Encounter for gynecological examination (general) (routine) without abnormal findings: Secondary | ICD-10-CM

## 2013-10-16 DIAGNOSIS — Z0000 Encounter for general adult medical examination without abnormal findings: Secondary | ICD-10-CM

## 2013-10-16 MED ORDER — ALPRAZOLAM 0.5 MG PO TABS: 0.5000 mg | ORAL_TABLET | Freq: Every evening | ORAL | Status: DC | PRN

## 2013-10-16 MED ORDER — HYDROCODONE-ACETAMINOPHEN 5-500 MG PO TABS: 1.0000 | ORAL_TABLET | Freq: Four times a day (QID) | ORAL | Status: DC | PRN

## 2013-10-16 MED ORDER — METAXALONE 800 MG PO TABS: 800.0000 mg | ORAL_TABLET | Freq: Three times a day (TID) | ORAL | Status: DC

## 2013-10-16 NOTE — Patient Instructions (Addendum)

## 2013-10-16 NOTE — Progress Notes (Addendum)
Patient ID: Jasmine Richard, female   DOB: [birth year], 57 y.o.   MRN: [id number] 57 y.o. [id number] Widowed Caucasian Fe here for annual exam.  Still dealing with emotional issues if husbands death.  Still some migraines - better since menopause.  Still has some migraines and needs a refill on med's that was given by Dr. Hyacinth Meeker.  She is seeing PCP next week and will be having labs.  No LMP recorded. Patient is postmenopausal.          Sexually active: no  The current method of family planning is abstinence.    Exercising: yes  Gym/ health club routine includes trainer 3-4 times per week. Smoker:  no  Health Maintenance: Pap: 09/30/12, WNL, neg HR HPV MMG: 03/20/13, Bi-Rads 1: negative Colonoscopy: 2005 BMD: 03/01/10 normal Tetanus:  6/02 Labs: PCP   reports that she has never smoked. She has never used smokeless tobacco. She reports that she drinks alcohol. She reports that she does not use illicit drugs.  Past Medical History  Diagnosis Date  . GERD (gastroesophageal reflux disease)   . Psoriatic arthritis     Dr. Zenovia Jordan  . Osteoarthritis     Past Surgical History  Procedure Laterality Date  . Cesarean section      x2  . Tonsillectomy and adenoidectomy  1960  . Hysteroscopy w/d&c  07    no polyps  . Gastric bypass  2002    lost 140 lb  . Cholecystectomy  1984  . Colonoscopy  2005    recheck in 5 years  . Tarsal metatarsal arthrodesis  11/28/2012    Procedure: TARSAL METATARSAL FUSION;  Surgeon: Sherri Rad, MD;  Location: [facility];  Service: Orthopedics;  Laterality: Right;  right 2nd and 3rd TMT joint fusion local bone graft stress x ray foot     Current Outpatient Prescriptions  Medication Sig Dispense Refill  . adalimumab (HUMIRA) 40 MG/0.8ML injection Inject 40 mg into the skin every 14 (fourteen) days.      . calcium carbonate (OS-CAL - DOSED IN MG OF ELEMENTAL CALCIUM) 1250 MG tablet Take 1 tablet by mouth daily.      Marland Kitchen HYDROcodone-acetaminophen  (VICODIN) 5-500 MG per tablet Take 1 tablet by mouth every 6 (six) hours as needed.  30 tablet  0  . metaxalone (SKELAXIN) 800 MG tablet Take 1 tablet (800 mg total) by mouth 3 (three) times daily.  40 tablet  1  . omeprazole (PRILOSEC) 40 MG capsule Take 40 mg by mouth 2 (two) times daily.      . potassium chloride (KLOR-CON) 8 MEQ tablet Take 8 mEq by mouth 2 (two) times daily.      . SUMAtriptan (IMITREX) 100 MG tablet Take 100 mg by mouth every 2 (two) hours as needed for migraine or headache. May repeat in 2 hours if headache persists or recurs.      . vitamin B-12 (CYANOCOBALAMIN) 1000 MCG tablet Inject 1,000 mcg into the muscle every 14 (fourteen) days.       . vitamin C (ASCORBIC ACID) 500 MG tablet Take 1 tablet (500 mg total) by mouth daily.  90 tablet  0  . zolpidem (AMBIEN) 10 MG tablet Take 1 tablet (10 mg total) by mouth at bedtime as needed for sleep.  30 tablet  5  . ALPRAZolam (XANAX) 0.5 MG tablet Take 1 tablet (0.5 mg total) by mouth at bedtime as needed for anxiety.  30 tablet  1   No  current facility-administered medications for this visit.    Family History  Problem Relation Age of Onset  . Cervical cancer Mother   . Hypertension Father   . Heart failure Father   . Hypertension Brother     also with hip replacements  . Hyperlipidemia Brother     ROS:  Pertinent items are noted in HPI.  Otherwise, a comprehensive ROS was negative.  Exam:   BP 112/60  Pulse 74  Resp 16  Ht 5' 4.25" (1.632 m)  Wt 165 lb (74.844 kg)  BMI 28.10 kg/m2 Height: 5' 4.25" (163.2 cm)  Ht Readings from Last 3 Encounters:  10/16/13 5' 4.25" (1.632 m)  11/28/12 5\' 6"  (1.676 m)  11/28/12 5\' 6"  (1.676 m)    General appearance: alert, cooperative and appears stated age Head: Normocephalic, without obvious abnormality, atraumatic Neck: no adenopathy, supple, symmetrical, trachea midline and thyroid normal to inspection and palpation Lungs: clear to auscultation bilaterally Breasts:  normal appearance, no masses or tenderness Heart: regular rate and rhythm Abdomen: soft, non-tender; no masses,  no organomegaly Extremities: extremities normal, atraumatic, no cyanosis or edema Skin: Skin color, texture, turgor normal. No rashes or lesions Lymph nodes: Cervical, supraclavicular, and axillary nodes normal. No abnormal inguinal nodes palpated Neurologic: Grossly normal   Pelvic: External genitalia:  no lesions              Urethra:  normal appearing urethra with no masses, tenderness or lesions              Bartholin's and Skene's: normal                 Vagina: normal appearing vagina with normal color and discharge, no lesions              Cervix: anteverted              Pap taken: yes Bimanual Exam:  Uterus:  normal size, contour, position, consistency, mobility, non-tender              Adnexa: no mass, fullness, tenderness               Rectovaginal: Confirms               Anus:  normal sphincter tone, no lesions  A:  Well Woman with normal exam  Grief reaction   Postmenopausal no HRT  History of migraine headaches - treated by Dr. Hyacinth Meeker  History of Psoriatic arthritis  P:   Pap smear as per guidelines Not done  Mammogram due 4/15  Refill on her migraine med's - Skelaxin, Hydrocodone, and Xanax prn  Will send her chart to Dr. Hyacinth Meeker to review if any other refills are required.  Counseled on breast self exam, adequate intake of calcium and vitamin D, diet and exercise return annually or prn  An After Visit Summary was printed and given to the patient.

## 2013-10-17 NOTE — Progress Notes (Signed)
Reviewed personally.  M. Suzanne Hephzibah Strehle, MD.  

## 2013-10-20 LAB — IPS PAP TEST WITH REFLEX TO HPV

## 2013-11-06 ENCOUNTER — Ambulatory Visit (HOSPITAL_COMMUNITY): Attending: Geriatric Medicine | Admitting: Radiology

## 2013-11-06 ENCOUNTER — Other Ambulatory Visit (HOSPITAL_COMMUNITY): Payer: Self-pay | Admitting: Geriatric Medicine

## 2013-11-06 DIAGNOSIS — R011 Cardiac murmur, unspecified: Secondary | ICD-10-CM

## 2013-11-06 DIAGNOSIS — I359 Nonrheumatic aortic valve disorder, unspecified: Secondary | ICD-10-CM | POA: Insufficient documentation

## 2013-11-06 NOTE — Progress Notes (Signed)
Echocardiogram performed.  

## 2013-11-10 ENCOUNTER — Other Ambulatory Visit (HOSPITAL_COMMUNITY)

## 2013-12-18 ENCOUNTER — Other Ambulatory Visit: Payer: Self-pay | Admitting: *Deleted

## 2013-12-18 MED ORDER — ZOLPIDEM TARTRATE 10 MG PO TABS
10.0000 mg | ORAL_TABLET | Freq: Every evening | ORAL | Status: DC | PRN
Start: 2013-12-18 — End: 2014-07-14

## 2013-12-18 NOTE — Telephone Encounter (Signed)
Incoming fax from [facility] pharmacy requesting Zolpidem 10 mg.  Last AEX 10/16/2013 Last refill 04/30/2013 #30/5 refills. Next appt. 10/26/2014.  Please advise.

## 2013-12-18 NOTE — Telephone Encounter (Addendum)
Rx approved by Ms Alexia Freestone. - Called in to [facility].

## 2014-02-04 ENCOUNTER — Other Ambulatory Visit: Payer: Self-pay | Admitting: *Deleted

## 2014-02-04 NOTE — Telephone Encounter (Signed)
Dr Hyacinth Meeker I did not feel comfortable with a refill on these med's I gave her at [facility] - must have been while you were out and did not check with you. Now she is calling about Xanax again - can you look at this and decide if you want to continue?

## 2014-02-04 NOTE — Telephone Encounter (Signed)
Incoming fax from [facility] requesting Xanax 0.5 mg  Last AEX and refill 10/16/13 #30/1 refill Next appt 10/26/14  Please approve or deny rx.

## 2014-02-05 MED ORDER — ALPRAZOLAM 0.5 MG PO TABS
0.5000 mg | ORAL_TABLET | Freq: Every evening | ORAL | Status: DC | PRN
Start: ? — End: 2014-08-29

## 2014-07-14 ENCOUNTER — Other Ambulatory Visit: Payer: Self-pay | Admitting: *Deleted

## 2014-07-14 MED ORDER — ZOLPIDEM TARTRATE 10 MG PO TABS
10.0000 mg | ORAL_TABLET | Freq: Every evening | ORAL | Status: DC | PRN
Start: 2014-07-14 — End: 2015-01-27

## 2014-07-14 NOTE — Telephone Encounter (Signed)
Incoming fax requesting Ambien  Last AEX 10/16/13 Last refill 12/18/13 #30/5 R Next appt 10/26/14  Please advise.

## 2014-07-15 NOTE — Telephone Encounter (Signed)
Rx called in 

## 2014-08-29 ENCOUNTER — Other Ambulatory Visit: Payer: Self-pay | Admitting: Obstetrics & Gynecology

## 2014-08-31 NOTE — Telephone Encounter (Signed)
Last refilled: 02/05/14 #30/5 refills  Last AEX: 10/16/13 with Ms. Patty AEX Scheduled: 11/03/14 with Ms. Patty   Please Advise.

## 2014-09-02 NOTE — Telephone Encounter (Signed)
[facility] calling to check on prescription.

## 2014-09-03 NOTE — Telephone Encounter (Signed)
rx faxed Encounter closed

## 2014-09-03 NOTE — Telephone Encounter (Signed)
Please send this request to Dr. Hyacinth Meeker as she first gave RX and I told her that I would not give refills on this but would have to be done with Dr. Dionicia Abler.

## 2014-09-03 NOTE — Telephone Encounter (Signed)
Routed to Provider

## 2014-09-27 HISTORY — PX: ULNAR TUNNEL RELEASE: SHX820

## 2014-09-27 HISTORY — PX: CARPAL TUNNEL RELEASE: SHX101

## 2014-09-28 ENCOUNTER — Encounter: Payer: Self-pay | Admitting: Nurse Practitioner

## 2014-10-02 ENCOUNTER — Other Ambulatory Visit: Payer: Self-pay

## 2014-10-02 NOTE — Telephone Encounter (Signed)
Need to discuss with Patty , I don't prescribe this

## 2014-10-02 NOTE — Telephone Encounter (Signed)
Incoming Refill Request from [facility]:[id number] 800mg   Last AEX:10/16/13 Last Refill:10/16/13 #40 X 1 Next AEX:11/03/14  Please Advise

## 2014-10-05 ENCOUNTER — Other Ambulatory Visit: Payer: Self-pay | Admitting: Nurse Practitioner

## 2014-10-05 MED ORDER — METAXALONE 800 MG PO TABS: 800.0000 mg | ORAL_TABLET | Freq: Three times a day (TID) | ORAL | Status: DC

## 2014-10-06 NOTE — Telephone Encounter (Signed)
Last AEX 10/16/13 Last refill 10/05/14 #40/ 1Refill Next appt 11/03/14  Please advise

## 2014-10-26 ENCOUNTER — Ambulatory Visit: Admitting: Nurse Practitioner

## 2014-10-30 ENCOUNTER — Ambulatory Visit (HOSPITAL_COMMUNITY): Attending: Geriatric Medicine

## 2014-10-30 ENCOUNTER — Other Ambulatory Visit (HOSPITAL_COMMUNITY)

## 2014-10-30 ENCOUNTER — Other Ambulatory Visit (HOSPITAL_COMMUNITY): Payer: Self-pay | Admitting: Geriatric Medicine

## 2014-10-30 DIAGNOSIS — I350 Nonrheumatic aortic (valve) stenosis: Secondary | ICD-10-CM | POA: Diagnosis not present

## 2014-10-30 DIAGNOSIS — I359 Nonrheumatic aortic valve disorder, unspecified: Secondary | ICD-10-CM

## 2014-10-30 NOTE — Progress Notes (Signed)
2D Echo completed. 10/30/2014 

## 2014-11-03 ENCOUNTER — Encounter: Payer: Self-pay | Admitting: Nurse Practitioner

## 2014-11-03 ENCOUNTER — Ambulatory Visit (INDEPENDENT_AMBULATORY_CARE_PROVIDER_SITE_OTHER): Admitting: Nurse Practitioner

## 2014-11-03 VITALS — BP 110/76 | HR 68 | Ht 64.25 in | Wt 165.0 lb

## 2014-11-03 DIAGNOSIS — E2839 Other primary ovarian failure: Secondary | ICD-10-CM

## 2014-11-03 DIAGNOSIS — Z0000 Encounter for general adult medical examination without abnormal findings: Secondary | ICD-10-CM

## 2014-11-03 DIAGNOSIS — E538 Deficiency of other specified B group vitamins: Secondary | ICD-10-CM

## 2014-11-03 DIAGNOSIS — Z1211 Encounter for screening for malignant neoplasm of colon: Secondary | ICD-10-CM

## 2014-11-03 DIAGNOSIS — Z01419 Encounter for gynecological examination (general) (routine) without abnormal findings: Secondary | ICD-10-CM

## 2014-11-03 LAB — POCT URINALYSIS DIPSTICK
Bilirubin, UA: NEGATIVE
Glucose, UA: NEGATIVE
Ketones, UA: NEGATIVE
Leukocytes, UA: NEGATIVE
Nitrite, UA: NEGATIVE
PH UA: 5
Protein, UA: NEGATIVE
RBC UA: NEGATIVE
UROBILINOGEN UA: NEGATIVE

## 2014-11-03 LAB — VITAMIN B12: Vitamin B-12: 400 pg/mL (ref 211–911)

## 2014-11-03 MED ORDER — SUMATRIPTAN SUCCINATE 100 MG PO TABS: ORAL_TABLET | ORAL | Status: AC

## 2014-11-03 MED ORDER — METAXALONE 800 MG PO TABS: 800.0000 mg | ORAL_TABLET | Freq: Three times a day (TID) | ORAL | Status: DC

## 2014-11-03 MED ORDER — CYANOCOBALAMIN 1000 MCG/ML IJ SOLN: 1000.0000 ug | INTRAMUSCULAR | Status: AC

## 2014-11-03 NOTE — Progress Notes (Signed)
Patient ID: Jasmine Richard, female   DOB: 01-18-1956, 58 y.o.   MRN: [id number] 58 y.o. [id number] Widowed Caucasian Fe here for annual exam.  She is postmenopausal and no vaginal bleed since hysteroscopy and D&C in 2007.  She is also doing some better since the loss of her husband in 2013.  She is not dating or SA.  She just had right ulnar release and carpal tunnel done about 3 weeks ago.  She is also helping her father who is in a nursing home and now on skilled care secondary to falling.  He can be quite demanding at times.  Patient's last menstrual period was 04/19/2011.          Sexually active: no  The current method of family planning is abstinence.  Exercising: yes Gym/ health club routine includes trainer 3-4 times per week.  Walking 5 times per week at home.  Has not been active last 3 weeks due to surgery. Smoker: no  Health Maintenance: Pap: 10/16/13, WNL, neg HR HPV; yearly pap due to cervical cancer in mother MMG: 08/13/14, Bi-Rads 1: negative Colonoscopy: 2005, due in 2016 per patient BMD: 03/01/10, -0.9/-0.9/-0.8 TDaP:  2014 with PCP Labs:  HB:  Recent surgery labs  Urine:  Negative    reports that she has never smoked. She has never used smokeless tobacco. She reports that she drinks alcohol. She reports that she does not use illicit drugs.  Past Medical History  Diagnosis Date  . GERD (gastroesophageal reflux disease)   . Psoriatic arthritis     Dr. Zenovia Jordan  . Osteoarthritis     Past Surgical History  Procedure Laterality Date  . Cesarean section      x2  . Tonsillectomy and adenoidectomy  1960  . Hysteroscopy w/d&c  07    no polyps  . Gastric bypass  2002    lost 140 lb  . Cholecystectomy  1984  . Colonoscopy  2005    recheck in 5 years  . Tarsal metatarsal arthrodesis  11/28/2012    Procedure: TARSAL METATARSAL FUSION;  Surgeon: Sherri Rad, MD;  Location: [facility];  Service: Orthopedics;  Laterality: Right;  right 2nd and 3rd TMT  joint fusion local bone graft stress x ray foot   . Ulnar tunnel release  09/2014  . Carpal tunnel release  09/2014    Current Outpatient Prescriptions  Medication Sig Dispense Refill  . adalimumab (HUMIRA) 40 MG/0.8ML injection Inject 40 mg into the skin every 14 (fourteen) days.    . ALPRAZolam (XANAX) 0.5 MG tablet TAKE 1 TABLET AT BEDTIME AS NEEDED FOR ANXIETY. 30 tablet 2  . calcium carbonate (OS-CAL - DOSED IN MG OF ELEMENTAL CALCIUM) 1250 MG tablet Take 1 tablet by mouth daily.    . cyanocobalamin (,VITAMIN B-12,) 1000 MCG/ML injection Inject 1 mL (1,000 mcg total) into the muscle every 30 (thirty) days. 10 mL 1  . HYDROcodone-acetaminophen (VICODIN) 5-500 MG per tablet Take 1 tablet by mouth every 6 (six) hours as needed. 30 tablet 0  . metaxalone (SKELAXIN) 800 MG tablet Take 1 tablet (800 mg total) by mouth 3 (three) times daily. 40 tablet 1  . omeprazole (PRILOSEC) 40 MG capsule Take 40 mg by mouth 2 (two) times daily.    . potassium chloride (KLOR-CON) 8 MEQ tablet Take 8 mEq by mouth 2 (two) times daily.    . SUMAtriptan (IMITREX) 100 MG tablet May repeat in 2 hours if headache persists or recurs. 10  tablet 5  . vitamin C (ASCORBIC ACID) 500 MG tablet Take 1 tablet (500 mg total) by mouth daily. 90 tablet 0  . zolpidem (AMBIEN) 10 MG tablet Take 1 tablet (10 mg total) by mouth at bedtime as needed for sleep. 30 tablet 5   No current facility-administered medications for this visit.    Family History  Problem Relation Age of Onset  . Cervical cancer Mother   . Hypertension Father   . Heart failure Father   . Hypertension Brother     also with hip replacements  . Hyperlipidemia Brother     ROS:  Pertinent items are noted in HPI.  Otherwise, a comprehensive ROS was negative.  Exam:   BP 110/76 mmHg  Pulse 68  Ht 5' 4.25" (1.632 m)  Wt 165 lb (74.844 kg)  BMI 28.10 kg/m2  LMP 04/19/2011 Height: 5' 4.25" (163.2 cm)  Ht Readings from Last 3 Encounters:  11/03/14 5'  4.25" (1.632 m)  10/16/13 5' 4.25" (1.632 m)  11/28/12 5\' 6"  (1.676 m)    General appearance: alert, cooperative and appears stated age Head: Normocephalic, without obvious abnormality, atraumatic Neck: no adenopathy, supple, symmetrical, trachea midline and thyroid normal to inspection and palpation Lungs: clear to auscultation bilaterally Breasts: normal appearance, no masses or tenderness Heart: regular rate and rhythm Abdomen: soft, non-tender; no masses,  no organomegaly Extremities: extremities normal, atraumatic, no cyanosis or edema, she has a long arm half splint on the right arm to the elbow. Skin: Skin color, texture, turgor normal. No rashes or lesions Lymph nodes: Cervical, supraclavicular, and axillary nodes normal. No abnormal inguinal nodes palpated Neurologic: Grossly normal   Pelvic: External genitalia:  no lesions              Urethra:  normal appearing urethra with no masses, tenderness or lesions              Bartholin's and Skene's: normal                 Vagina: normal appearing vagina with normal color and discharge, no lesions              Cervix: anteverted              Pap taken: Yes.   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 2003 History of Psoriatic arthritis  S/P right ulnar release and carpal tunnel 3 weeks ago  P:   Reviewed health and wellness pertinent to exam  Pap smear taken today  Mammogram is due 9/16  Order placed for BMD  IFOB is given  Refill on Vit B 12 injections   Refill on Imitrex along with Skelaxin as  treatment for migraine headaches.  No refill given on Xanax or Ambien as that is given at the discretion of Dr. 10/16.  Counseled on breast self exam, mammography screening, adequate intake of calcium and vitamin D, diet and exercise, Kegel's  exercises return annually or prn  An After Visit Summary was printed and given to the patient.

## 2014-11-03 NOTE — Patient Instructions (Signed)

## 2014-11-05 LAB — IPS PAP TEST WITH HPV

## 2014-11-08 NOTE — Progress Notes (Signed)
Encounter reviewed by Dr. Brook Silva.  

## 2014-11-09 ENCOUNTER — Telehealth: Payer: Self-pay | Admitting: Nurse Practitioner

## 2014-11-09 NOTE — Telephone Encounter (Signed)
Pt states Jasmine Richard called her with lab results and left message to call back with questions.  Best: [phone]  bf

## 2014-11-09 NOTE — Telephone Encounter (Signed)
RC to patient.  Pt states she is taking B12 due to non-absorbtion after gastric bypass surgery.  Pt had injection week prior to having labs drawn and states that her "level will never be normal."  Advised pt I would let Patty know she had just had injection.  Pt needs new RX sent to pharmacy for B12 injection.

## 2014-11-10 NOTE — Telephone Encounter (Signed)
On 11/03/14 at [facility] a new RX was already sent.  Her Vit B 12 was in normal limits - that did not mean that I wanted her to stop the injections.  She has to continue for her malabsorption issues related to surgery.

## 2014-11-11 ENCOUNTER — Telehealth: Payer: Self-pay | Admitting: Cardiology

## 2014-11-11 NOTE — Telephone Encounter (Signed)
Incoming records received on 12.16.15 from [facility] for an appointment with Dr. Delton See on 1.15.16 at 9:30 a.m: Given to the chart prep team on 12.16.15:djc TEE request received as a separate fax on 12.16.15 taken to triage on 12.16.15:djc

## 2014-12-01 ENCOUNTER — Telehealth: Payer: Self-pay

## 2014-12-01 NOTE — Telephone Encounter (Signed)
Patient notified of BMD results. Copy will be scanned in epic.//kn 

## 2014-12-01 NOTE — Telephone Encounter (Signed)
lmtcb to discuss BMD results.//kn 

## 2014-12-11 ENCOUNTER — Ambulatory Visit: Admitting: Cardiology

## 2014-12-22 ENCOUNTER — Other Ambulatory Visit: Payer: Self-pay | Admitting: Obstetrics & Gynecology

## 2014-12-23 NOTE — Telephone Encounter (Signed)
Medication refill request: Xanax 0.5 mg Last AEX:  11/03/14 Next AEX: 11/08/15 Last MMG (if hormonal medication request): 07/2014 [id number]:Neg Refill authorized: 09/03/14 #30/2R. Today?

## 2014-12-24 NOTE — Telephone Encounter (Signed)
This note needs to go to Dr. Hyacinth Meeker as I do not give her this med. Thanks.

## 2014-12-29 ENCOUNTER — Encounter: Payer: Self-pay | Admitting: *Deleted

## 2014-12-29 ENCOUNTER — Encounter: Payer: Self-pay | Admitting: Cardiology

## 2014-12-29 ENCOUNTER — Ambulatory Visit (INDEPENDENT_AMBULATORY_CARE_PROVIDER_SITE_OTHER): Admitting: Cardiology

## 2014-12-29 VITALS — BP 104/64 | HR 70 | Ht 64.0 in | Wt 167.0 lb

## 2014-12-29 DIAGNOSIS — R011 Cardiac murmur, unspecified: Secondary | ICD-10-CM

## 2014-12-29 DIAGNOSIS — I350 Nonrheumatic aortic (valve) stenosis: Secondary | ICD-10-CM

## 2014-12-29 DIAGNOSIS — K9289 Other specified diseases of the digestive system: Secondary | ICD-10-CM | POA: Insufficient documentation

## 2014-12-29 DIAGNOSIS — K219 Gastro-esophageal reflux disease without esophagitis: Secondary | ICD-10-CM | POA: Insufficient documentation

## 2014-12-29 DIAGNOSIS — F900 Attention-deficit hyperactivity disorder, predominantly inattentive type: Secondary | ICD-10-CM | POA: Insufficient documentation

## 2014-12-29 DIAGNOSIS — G43009 Migraine without aura, not intractable, without status migrainosus: Secondary | ICD-10-CM | POA: Insufficient documentation

## 2014-12-29 NOTE — Patient Instructions (Signed)
Your physician recommends that you continue on your current medications as directed. Please refer to the Current Medication list given to you today.   Your physician has requested that you have a TEE. During a TEE, sound waves are used to create images of your heart. It provides your doctor with information about the size and shape of your heart and how well your heart's chambers and valves are working. In this test, a transducer is attached to the end of a flexible tube that's guided down your throat and into your esophagus (the tube leading from you mouth to your stomach) to get a more detailed image of your heart. You are not awake for the procedure. Please see the instruction sheet given to you today. For further information please visit [email or web address]  YOU ARE SCHEDULED FOR A TEE ON NEXT Thursday 01/07/15 AT 10 AM---YOU MUST ARRIVE AT [facility] SHORT STAY AT 8:30 AM ON THIS DAY.     Your physician wants you to follow-up in: ONE YEAR WITH DR Johnell Comings will receive a reminder letter in the mail two months in advance. If you don't receive a letter, please call our office to schedule the follow-up appointment.

## 2014-12-29 NOTE — Telephone Encounter (Signed)
Called and left message for patient in regards to prescription and how we have sent a refill request to a provider and that we're just waiting on a response from the physician.

## 2014-12-29 NOTE — Telephone Encounter (Signed)
Dr. Hyacinth Meeker please advise refills.

## 2014-12-29 NOTE — Progress Notes (Signed)
Patient ID: Jasmine Richard, female   DOB: 03/28/1956, 59 y.o.   MRN: [id number]    Patient Name: Jasmine Richard Date of Encounter: 12/29/2014  Primary Care Provider:  Ginette Otto, MD Primary Cardiologist: Jasmine Richard H/*   Problem List   Past Medical History  Diagnosis Date  . GERD (gastroesophageal reflux disease)   . Psoriatic arthritis     Dr. Zenovia Richard  . Osteoarthritis    Past Surgical History  Procedure Laterality Date  . Cesarean section      x2  . Tonsillectomy and adenoidectomy  1960  . Hysteroscopy w/d&c  07    no polyps  . Gastric bypass  2002    lost 140 lb  . Cholecystectomy  1984  . Colonoscopy  2005    recheck in 5 years  . Tarsal metatarsal arthrodesis  11/28/2012    Procedure: TARSAL METATARSAL FUSION;  Surgeon: Jasmine Rad, MD;  Location: [facility];  Service: Orthopedics;  Laterality: Right;  right 2nd and 3rd TMT joint fusion local bone graft stress x ray foot   . Ulnar tunnel release  09/2014  . Carpal tunnel release  09/2014    Allergies  No Known Allergies  HPI  A pleasant 59 year old female with prior medical history of psoriatic arthritis and often arthritis, obesity, status post gastric bypass surgery with weight loss of 140 pounds, the patient has been referred to [place] for evaluation of aortic stenosis versus subaortic membrane. The patient was found to have significant systolic murmur on her annual physical and underwent echocardiography. Echocardiogram showed elevated transaortic gradient with mean gradient of 50 mmHg. Aortic valve in fact looks mildly sick and however is trileaflet and is opening well and therefore transaortic gradient is suspicious for possible subaortic membrane. The patient is very active she goes to the gym 6 times a week and exercises for 1 hour without any shortness of breath or chest pain. She denies any palpitations dizziness or syncope. There is no significant family history of  premature coronary artery disease or aortic stenosis. She denies any orthopnea, paroxysmal nocturnal dyspnea or lower extremity edema. She states that her lipids were checked by her primary care physician recently and they're all at goal.  Home Medications  Prior to Admission medications   Medication Sig Start Date End Date Taking? Authorizing Provider  adalimumab (HUMIRA) 40 MG/0.8ML injection Inject 40 mg into the skin every 14 (fourteen) days.   Yes Historical Provider, MD  ALPRAZolam (XANAX) 0.5 MG tablet TAKE 1 TABLET AT BEDTIME AS NEEDED FOR ANXIETY. 09/03/14  Yes Jasmine Boots, MD  Ascorbic Acid (VITAMIN C) 1000 MG tablet Take 1,000 mg by mouth daily.   Yes Historical Provider, MD  BIOTIN PO Take 1 tablet by mouth daily.   Yes Historical Provider, MD  Calcium Carbonate-Vit D-Min (CALCIUM 1200 PO) Take 1 tablet by mouth 2 (two) times daily.   Yes Historical Provider, MD  cetirizine (ZYRTEC) 10 MG tablet Take 10 mg by mouth daily.   Yes Historical Provider, MD  Cholecalciferol (VITAMIN D3) 2000 UNITS TABS Take 1 tablet by mouth 2 (two) times daily.   Yes Historical Provider, MD  cyanocobalamin (,VITAMIN B-12,) 1000 MCG/ML injection Inject 1 mL (1,000 mcg total) into the muscle every 30 (thirty) days. 11/03/14  Yes Jasmine Rolen-Grubb, FNP  folic acid (FOLVITE) 1 MG tablet Take 2 mg by mouth 2 (two) times daily.   Yes Historical Provider, MD  GLUCOSAMINE-CHONDROITIN PO Take by mouth 2 (  two) times daily.   Yes Historical Provider, MD  HYDROcodone-acetaminophen (VICODIN) 5-500 MG per tablet Take 1 tablet by mouth every 6 (six) hours as needed. 10/16/13  Yes Jasmine Rolen-Grubb, FNP  MAGNESIUM PO Take 250 mg by mouth 2 (two) times daily.   Yes Historical Provider, MD  meloxicam (MOBIC) 15 MG tablet Take 15 mg by mouth daily.   Yes Historical Provider, MD  metaxalone (SKELAXIN) 800 MG tablet Take 1 tablet (800 mg total) by mouth 3 (three) times daily. 11/03/14  Yes Jasmine Rolen-Grubb, FNP    methotrexate (RHEUMATREX) 2.5 MG tablet Take 12 mg by mouth once a week.   Yes Historical Provider, MD  MULTIPLE VITAMIN PO Take 0.5 tablets by mouth 2 (two) times daily.   Yes Historical Provider, MD  omeprazole (PRILOSEC) 40 MG capsule Take 40 mg by mouth 2 (two) times daily.   Yes Historical Provider, MD  POTASSIUM PO Take 250 mg by mouth 2 (two) times daily.   Yes Historical Provider, MD  Probiotic Product (PROBIOTIC PO) Take 1 tablet by mouth daily.   Yes Historical Provider, MD  Pyridoxine HCl (VITAMIN B6 PO) Take 400 mg by mouth 2 (two) times daily as needed.   Yes Historical Provider, MD  SUMAtriptan (IMITREX) 100 MG tablet May repeat in 2 hours if headache persists or recurs. 11/03/14  Yes Jasmine Rolen-Grubb, FNP  zolpidem (AMBIEN) 10 MG tablet Take 1 tablet (10 mg total) by mouth at bedtime as needed for sleep. 07/14/14  Yes Jasmine Franklin, FNP    Family History  Family History  Problem Relation Age of Onset  . Cervical cancer Mother   . Hypertension Father   . Heart failure Father   . Hypertension Brother     also with hip replacements  . Hyperlipidemia Brother     Social History  History   Social History  . Marital Status: Widowed    Spouse Name: N/A    Number of Children: 3  . Years of Education: N/A   Occupational History  . Not on file.   Social History Main Topics  . Smoking status: Never Smoker   . Smokeless tobacco: Never Used  . Alcohol Use: Yes     Comment: occ  . Drug Use: No  . Sexual Activity: No     Comment: husband passed 06/2012   Other Topics Concern  . Not on file   Social History Narrative     Review of Systems, as per HPI, otherwise negative General:  No chills, fever, night sweats or weight changes.  Cardiovascular:  No chest pain, dyspnea on exertion, edema, orthopnea, palpitations, paroxysmal nocturnal dyspnea. Dermatological: No rash, lesions/masses Respiratory: No cough, dyspnea Urologic: No hematuria,  dysuria Abdominal:   No nausea, vomiting, diarrhea, bright red blood per rectum, melena, or hematemesis Neurologic:  No visual changes, wkns, changes in mental status. All other systems reviewed and are otherwise negative except as noted above.  Physical Exam  Blood pressure 104/64, pulse 70, height 5\' 4"  (1.626 m), weight 167 lb (75.751 kg), last menstrual period 04/19/2011.  General: Pleasant, NAD Psych: Normal affect. Neuro: Alert and oriented X 3. Moves all extremities spontaneously. HEENT: Normal  Neck: Supple without bruits or JVD. Lungs:  Resp regular and unlabored, CTA. Heart: RRR no s3, s4, 4/6 holosystolic murmur. Abdomen: Soft, non-tender, non-distended, BS + x 4.  Extremities: No clubbing, cyanosis or edema. DP/PT/Radials 2+ and equal bilaterally.  Labs:  No results for input(s): CKTOTAL, CKMB, TROPONINI in the last  72 hours. Lab Results  Component Value Date   WBC 5.1 02/01/2013   HGB 15.2 02/01/2013   HCT 47.1 02/01/2013   MCV 104.1* 02/01/2013    No results found for: DDIMER Invalid input(s): POCBNP    Component Value Date/Time   NA 139 02/01/2013 1046   K 3.6 02/01/2013 1046   CL 99 02/01/2013 1046   CO2 17* 02/01/2013 1046   GLUCOSE 89 02/01/2013 1046   BUN 8 02/01/2013 1046   CREATININE 0.65 02/01/2013 1046   CALCIUM 9.9 02/01/2013 1046   PROT 6.9 02/01/2013 1046   ALBUMIN 4.3 02/01/2013 1046   AST 19 02/01/2013 1046   ALT 13 02/01/2013 1046   ALKPHOS 67 02/01/2013 1046   BILITOT 0.4 02/01/2013 1046   No results found for: CHOL, HDL, LDLCALC, TRIG  Accessory Clinical Findings  Echocardiogram 10/31/2015 Left ventricle: The cavity size was normal. Wall thickness was normal. Systolic function was vigorous. The estimated ejection fraction was in the range of 65% to 70%. Wall motion was normal; there were no regional wall motion abnormalities. Doppler parameters are consistent with abnormal left ventricular relaxation (grade 1 diastolic  dysfunction). The E/e&' ratio is <8, suggesting normal LV filling pressure. - Aortic valve: Trileaflet. Sclerosis without stenosis. Transvalvular velocity was minimally increased. Cannot r/o subvalvular membrane. There was mild regurgitation. Mean gradient (S): 15 mm Hg. - Mitral valve: Mildly thickened leaflets . There was trivial regurgitation. - Left atrium: The atrium was mildly dilated at 36 ml/m2. - Right atrium: The atrium was mildly dilated. - Atrial septum: No defect or patent foramen ovale was identified. - Pulmonary arteries: PA peak pressure: 37 mm Hg (S). - Systemic veins: The IVC measures <2.1 cm, but does not collapse more than 50%, suggesting elevated RA pressure of 8 mmHg.  Impressions:  - Compared to the prior echo in 2014, the mean gradient is somewhat less. The aortic valve leaflets are visualized and DO NOT appear stenotic. The aortic valve gradient is elevated - possibilities include a subvalvular membrane or perhaps high flow given the hyperdynamic EF. Consider TEE for additional evaluation of the aortic valve.  ECG - SR, normal ECG    Assessment & Plan  A pleasant 59 year old female  1. Systolic murmur and elevated friends aortic gradient, we will schedule a TEE to distinguish between mild aortic stenosis versus subaortic membrane. Patient agrees, we will discuss further plan after TEE.  2. Blood pressure - well controlled  3. Lipids - at goal, if in fact confirmed aortic stenosis we might consider starting statins.  Follow-up in one year unless abnormality on TEE.    Lars Masson, MD, [facility] 12/29/2014, 11:59 AM

## 2014-12-29 NOTE — Telephone Encounter (Signed)
Patient is calling to see if she can get a refill for ALPRAZolam (XANAX) 0.5 MG tablet. She states her pharmacy called last week and have not heard back from [facility] yet. She is using [employer] [phone].

## 2015-01-01 NOTE — Telephone Encounter (Signed)
Spoke with patient. Advised of message as seen below from Dr.Miller. Patient is agreeable. Routing to Dr.Miller for review and advise.

## 2015-01-04 NOTE — Telephone Encounter (Signed)
01/01/15 RX was signed by Dr. Hyacinth Meeker and faxed to [facility]. [facility], patient picked up rx Friday.

## 2015-01-07 ENCOUNTER — Encounter (HOSPITAL_COMMUNITY): Payer: Self-pay | Admitting: *Deleted

## 2015-01-07 ENCOUNTER — Encounter (HOSPITAL_COMMUNITY)
Admission: RE | Disposition: A | Discharge status: Home / Self Care | Payer: Self-pay | Source: Ambulatory Visit | Attending: Cardiology

## 2015-01-07 ENCOUNTER — Ambulatory Visit (HOSPITAL_COMMUNITY)
Admission: RE | Admit: 2015-01-07 | Discharge: 2015-01-07 | Disposition: A | Discharge status: Home / Self Care | Source: Ambulatory Visit | Attending: Cardiology | Admitting: Cardiology

## 2015-01-07 DIAGNOSIS — R011 Cardiac murmur, unspecified: Secondary | ICD-10-CM

## 2015-01-07 DIAGNOSIS — L4050 Arthropathic psoriasis, unspecified: Secondary | ICD-10-CM | POA: Diagnosis not present

## 2015-01-07 DIAGNOSIS — E669 Obesity, unspecified: Secondary | ICD-10-CM | POA: Diagnosis not present

## 2015-01-07 DIAGNOSIS — I350 Nonrheumatic aortic (valve) stenosis: Secondary | ICD-10-CM | POA: Insufficient documentation

## 2015-01-07 DIAGNOSIS — R7989 Other specified abnormal findings of blood chemistry: Secondary | ICD-10-CM

## 2015-01-07 DIAGNOSIS — K219 Gastro-esophageal reflux disease without esophagitis: Secondary | ICD-10-CM | POA: Insufficient documentation

## 2015-01-07 HISTORY — PX: TEE WITHOUT CARDIOVERSION: SHX5443

## 2015-01-07 SURGERY — ECHOCARDIOGRAM, TRANSESOPHAGEAL
Anesthesia: Moderate Sedation

## 2015-01-07 MED ORDER — DIPHENHYDRAMINE HCL 50 MG/ML IJ SOLN
INTRAMUSCULAR | Status: AC
  Filled 2015-01-07: qty 1

## 2015-01-07 MED ORDER — DIPHENHYDRAMINE HCL 50 MG/ML IJ SOLN
INTRAMUSCULAR | Status: DC | PRN
  Administered 2015-01-07 (×2): 25 mg via INTRAVENOUS

## 2015-01-07 MED ORDER — MIDAZOLAM HCL 5 MG/ML IJ SOLN
INTRAMUSCULAR | Status: AC
  Filled 2015-01-07: qty 2

## 2015-01-07 MED ORDER — MIDAZOLAM HCL 10 MG/2ML IJ SOLN
INTRAMUSCULAR | Status: DC | PRN
Start: 2015-01-07 — End: 2015-01-07
  Administered 2015-01-07 (×3): 2 mg via INTRAVENOUS
  Administered 2015-01-07: 1 mg via INTRAVENOUS
  Administered 2015-01-07: 2 mg via INTRAVENOUS

## 2015-01-07 MED ORDER — BUTAMBEN-TETRACAINE-BENZOCAINE 2-2-14 % EX AERO
INHALATION_SPRAY | CUTANEOUS | Status: DC | PRN
  Administered 2015-01-07: 2 via TOPICAL

## 2015-01-07 MED ORDER — FENTANYL CITRATE 0.05 MG/ML IJ SOLN
INTRAMUSCULAR | Status: AC
  Filled 2015-01-07: qty 2

## 2015-01-07 MED ORDER — SODIUM CHLORIDE 0.9 % IV SOLN
INTRAVENOUS | Status: DC
  Administered 2015-01-07: 09:00:00 via INTRAVENOUS

## 2015-01-07 MED ORDER — FENTANYL CITRATE 0.05 MG/ML IJ SOLN
INTRAMUSCULAR | Status: DC | PRN
  Administered 2015-01-07 (×2): 25 ug via INTRAVENOUS
  Administered 2015-01-07: 50 ug via INTRAVENOUS

## 2015-01-07 NOTE — Interval H&P Note (Signed)
History and Physical Interval Note:  01/07/2015 8:47 AM  Jasmine Richard  has presented today for surgery, with the diagnosis of aortic stenosis  The various methods of treatment have been discussed with the patient and family. After consideration of risks, benefits and other options for treatment, the patient has consented to  Procedure(s): TRANSESOPHAGEAL ECHOCARDIOGRAM (TEE) (N/A) as a surgical intervention .  The patient's history has been reviewed, patient examined, no change in status, stable for surgery.  I have reviewed the patient's chart and labs.  Questions were answered to the patient's satisfaction.     Lars Masson

## 2015-01-07 NOTE — Discharge Instructions (Signed)
Conscious Sedation, Adult, Care After °Refer to this sheet in the next few weeks. These instructions provide you with information on caring for yourself after your procedure. Your health care provider may also give you more specific instructions. Your treatment has been planned according to current medical practices, but problems sometimes occur. Call your health care provider if you have any problems or questions after your procedure. °WHAT TO EXPECT AFTER THE PROCEDURE  °After your procedure: °· You may feel sleepy, clumsy, and have poor balance for several hours. °· Vomiting may occur if you eat too soon after the procedure. °HOME CARE INSTRUCTIONS °· Do not participate in any activities where you could become injured for at least 24 hours. Do not: °¨ Drive. °¨ Swim. °¨ Ride a bicycle. °¨ Operate heavy machinery. °¨ Cook. °¨ Use power tools. °¨ Climb ladders. °¨ Work from a high place. °· Do not make important decisions or sign legal documents until you are improved. °· If you vomit, drink water, juice, or soup when you can drink without vomiting. Make sure you have little or no nausea before eating solid foods. °· Only take over-the-counter or prescription medicines for pain, discomfort, or fever as directed by your health care provider. °· Make sure you and your family fully understand everything about the medicines given to you, including what side effects may occur. °· You should not drink alcohol, take sleeping pills, or take medicines that cause drowsiness for at least 24 hours. °· If you smoke, do not smoke without supervision. °· If you are feeling better, you may resume normal activities 24 hours after you were sedated. °· Keep all appointments with your health care provider. °SEEK MEDICAL CARE IF: °· Your skin is pale or bluish in color. °· You continue to feel nauseous or vomit. °· Your pain is getting worse and is not helped by medicine. °· You have bleeding or swelling. °· You are still sleepy or  feeling clumsy after 24 hours. °SEEK IMMEDIATE MEDICAL CARE IF: °· You develop a rash. °· You have difficulty breathing. °· You develop any type of allergic problem. °· You have a fever. °MAKE SURE YOU: °· Understand these instructions. °· Will watch your condition. °· Will get help right away if you are not doing well or get worse. °Document Released: 09/03/2013 Document Reviewed: 09/03/2013 °ExitCare® Patient Information ©2015 [employer]. This information is not intended to replace advice given to you by your health care provider. Make sure you discuss any questions you have with your health care provider. °  °

## 2015-01-07 NOTE — CV Procedure (Signed)
     Transesophageal Echocardiogram Note  SUELLA COGAR [id number] 1956/01/05  Procedure: Transesophageal Echocardiogram Indications: Elevated transaortic gradient  Procedure Details Consent: Obtained Time Out: Verified patient identification, verified procedure, site/side was marked, verified correct patient position, special equipment/implants available, Radiology Safety Procedures followed,  medications/allergies/relevent history reviewed, required imaging and test results available.  Performed  Medications: Fentanyl: 100 mcg Versed: 9 mg Benadryl 50 mg  No evidence of subvalvular membrane, for full report please see TEE read.   Complications: No apparent complications Patient did tolerate procedure well.  Lars Masson, MD, [facility] 01/07/2015, 8:48 AM

## 2015-01-07 NOTE — Progress Notes (Signed)
  Echocardiogram Echocardiogram Transesophageal has been performed.  Jasmine Richard 01/07/2015, 11:22 AM

## 2015-01-07 NOTE — H&P (View-Only) (Signed)
Patient ID: Jasmine Richard, female   DOB: 03/28/1956, 59 y.o.   MRN: [id number]    Patient Name: Jasmine Richard Date of Encounter: 12/29/2014  Primary Care Provider:  Ginette Otto, MD Primary Cardiologist: Tobias Alexander H/*   Problem List   Past Medical History  Diagnosis Date  . GERD (gastroesophageal reflux disease)   . Psoriatic arthritis     Dr. Zenovia Jordan  . Osteoarthritis    Past Surgical History  Procedure Laterality Date  . Cesarean section      x2  . Tonsillectomy and adenoidectomy  1960  . Hysteroscopy w/d&c  07    no polyps  . Gastric bypass  2002    lost 140 lb  . Cholecystectomy  1984  . Colonoscopy  2005    recheck in 5 years  . Tarsal metatarsal arthrodesis  11/28/2012    Procedure: TARSAL METATARSAL FUSION;  Surgeon: Sherri Rad, MD;  Location: [facility];  Service: Orthopedics;  Laterality: Right;  right 2nd and 3rd TMT joint fusion local bone graft stress x ray foot   . Ulnar tunnel release  09/2014  . Carpal tunnel release  09/2014    Allergies  No Known Allergies  HPI  A pleasant 59 year old female with prior medical history of psoriatic arthritis and often arthritis, obesity, status post gastric bypass surgery with weight loss of 140 pounds, the patient has been referred to [place] for evaluation of aortic stenosis versus subaortic membrane. The patient was found to have significant systolic murmur on her annual physical and underwent echocardiography. Echocardiogram showed elevated transaortic gradient with mean gradient of 50 mmHg. Aortic valve in fact looks mildly sick and however is trileaflet and is opening well and therefore transaortic gradient is suspicious for possible subaortic membrane. The patient is very active she goes to the gym 6 times a week and exercises for 1 hour without any shortness of breath or chest pain. She denies any palpitations dizziness or syncope. There is no significant family history of  premature coronary artery disease or aortic stenosis. She denies any orthopnea, paroxysmal nocturnal dyspnea or lower extremity edema. She states that her lipids were checked by her primary care physician recently and they're all at goal.  Home Medications  Prior to Admission medications   Medication Sig Start Date End Date Taking? Authorizing Provider  adalimumab (HUMIRA) 40 MG/0.8ML injection Inject 40 mg into the skin every 14 (fourteen) days.   Yes Historical Provider, MD  ALPRAZolam (XANAX) 0.5 MG tablet TAKE 1 TABLET AT BEDTIME AS NEEDED FOR ANXIETY. 09/03/14  Yes Annamaria Boots, MD  Ascorbic Acid (VITAMIN C) 1000 MG tablet Take 1,000 mg by mouth daily.   Yes Historical Provider, MD  BIOTIN PO Take 1 tablet by mouth daily.   Yes Historical Provider, MD  Calcium Carbonate-Vit D-Min (CALCIUM 1200 PO) Take 1 tablet by mouth 2 (two) times daily.   Yes Historical Provider, MD  cetirizine (ZYRTEC) 10 MG tablet Take 10 mg by mouth daily.   Yes Historical Provider, MD  Cholecalciferol (VITAMIN D3) 2000 UNITS TABS Take 1 tablet by mouth 2 (two) times daily.   Yes Historical Provider, MD  cyanocobalamin (,VITAMIN B-12,) 1000 MCG/ML injection Inject 1 mL (1,000 mcg total) into the muscle every 30 (thirty) days. 11/03/14  Yes Patricia Rolen-Grubb, FNP  folic acid (FOLVITE) 1 MG tablet Take 2 mg by mouth 2 (two) times daily.   Yes Historical Provider, MD  GLUCOSAMINE-CHONDROITIN PO Take by mouth 2 (  two) times daily.   Yes Historical Provider, MD  HYDROcodone-acetaminophen (VICODIN) 5-500 MG per tablet Take 1 tablet by mouth every 6 (six) hours as needed. 10/16/13  Yes Patricia Rolen-Grubb, FNP  MAGNESIUM PO Take 250 mg by mouth 2 (two) times daily.   Yes Historical Provider, MD  meloxicam (MOBIC) 15 MG tablet Take 15 mg by mouth daily.   Yes Historical Provider, MD  metaxalone (SKELAXIN) 800 MG tablet Take 1 tablet (800 mg total) by mouth 3 (three) times daily. 11/03/14  Yes Patricia Rolen-Grubb, FNP    methotrexate (RHEUMATREX) 2.5 MG tablet Take 12 mg by mouth once a week.   Yes Historical Provider, MD  MULTIPLE VITAMIN PO Take 0.5 tablets by mouth 2 (two) times daily.   Yes Historical Provider, MD  omeprazole (PRILOSEC) 40 MG capsule Take 40 mg by mouth 2 (two) times daily.   Yes Historical Provider, MD  POTASSIUM PO Take 250 mg by mouth 2 (two) times daily.   Yes Historical Provider, MD  Probiotic Product (PROBIOTIC PO) Take 1 tablet by mouth daily.   Yes Historical Provider, MD  Pyridoxine HCl (VITAMIN B6 PO) Take 400 mg by mouth 2 (two) times daily as needed.   Yes Historical Provider, MD  SUMAtriptan (IMITREX) 100 MG tablet May repeat in 2 hours if headache persists or recurs. 11/03/14  Yes Patricia Rolen-Grubb, FNP  zolpidem (AMBIEN) 10 MG tablet Take 1 tablet (10 mg total) by mouth at bedtime as needed for sleep. 07/14/14  Yes Patricia Rolen-Grubb, FNP    Family History  Family History  Problem Relation Age of Onset  . Cervical cancer Mother   . Hypertension Father   . Heart failure Father   . Hypertension Brother     also with hip replacements  . Hyperlipidemia Brother     Social History  History   Social History  . Marital Status: Widowed    Spouse Name: N/A    Number of Children: 3  . Years of Education: N/A   Occupational History  . Not on file.   Social History Main Topics  . Smoking status: Never Smoker   . Smokeless tobacco: Never Used  . Alcohol Use: Yes     Comment: occ  . Drug Use: No  . Sexual Activity: No     Comment: husband passed 06/2012   Other Topics Concern  . Not on file   Social History Narrative     Review of Systems, as per HPI, otherwise negative General:  No chills, fever, night sweats or weight changes.  Cardiovascular:  No chest pain, dyspnea on exertion, edema, orthopnea, palpitations, paroxysmal nocturnal dyspnea. Dermatological: No rash, lesions/masses Respiratory: No cough, dyspnea Urologic: No hematuria,  dysuria Abdominal:   No nausea, vomiting, diarrhea, bright red blood per rectum, melena, or hematemesis Neurologic:  No visual changes, wkns, changes in mental status. All other systems reviewed and are otherwise negative except as noted above.  Physical Exam  Blood pressure 104/64, pulse 70, height 5' 4" (1.626 m), weight 167 lb (75.751 kg), last menstrual period 04/19/2011.  General: Pleasant, NAD Psych: Normal affect. Neuro: Alert and oriented X 3. Moves all extremities spontaneously. HEENT: Normal  Neck: Supple without bruits or JVD. Lungs:  Resp regular and unlabored, CTA. Heart: RRR no s3, s4, 4/6 holosystolic murmur. Abdomen: Soft, non-tender, non-distended, BS + x 4.  Extremities: No clubbing, cyanosis or edema. DP/PT/Radials 2+ and equal bilaterally.  Labs:  No results for input(s): CKTOTAL, CKMB, TROPONINI in the last   72 hours. Lab Results  Component Value Date   WBC 5.1 02/01/2013   HGB 15.2 02/01/2013   HCT 47.1 02/01/2013   MCV 104.1* 02/01/2013    No results found for: DDIMER Invalid input(s): POCBNP    Component Value Date/Time   NA 139 02/01/2013 1046   K 3.6 02/01/2013 1046   CL 99 02/01/2013 1046   CO2 17* 02/01/2013 1046   GLUCOSE 89 02/01/2013 1046   BUN 8 02/01/2013 1046   CREATININE 0.65 02/01/2013 1046   CALCIUM 9.9 02/01/2013 1046   PROT 6.9 02/01/2013 1046   ALBUMIN 4.3 02/01/2013 1046   AST 19 02/01/2013 1046   ALT 13 02/01/2013 1046   ALKPHOS 67 02/01/2013 1046   BILITOT 0.4 02/01/2013 1046   No results found for: CHOL, HDL, LDLCALC, TRIG  Accessory Clinical Findings  Echocardiogram 10/31/2015 Left ventricle: The cavity size was normal. Wall thickness was normal. Systolic function was vigorous. The estimated ejection fraction was in the range of 65% to 70%. Wall motion was normal; there were no regional wall motion abnormalities. Doppler parameters are consistent with abnormal left ventricular relaxation (grade 1 diastolic  dysfunction). The E/e&' ratio is <8, suggesting normal LV filling pressure. - Aortic valve: Trileaflet. Sclerosis without stenosis. Transvalvular velocity was minimally increased. Cannot r/o subvalvular membrane. There was mild regurgitation. Mean gradient (S): 15 mm Hg. - Mitral valve: Mildly thickened leaflets . There was trivial regurgitation. - Left atrium: The atrium was mildly dilated at 36 ml/m2. - Right atrium: The atrium was mildly dilated. - Atrial septum: No defect or patent foramen ovale was identified. - Pulmonary arteries: PA peak pressure: 37 mm Hg (S). - Systemic veins: The IVC measures <2.1 cm, but does not collapse more than 50%, suggesting elevated RA pressure of 8 mmHg.  Impressions:  - Compared to the prior echo in 2014, the mean gradient is somewhat less. The aortic valve leaflets are visualized and DO NOT appear stenotic. The aortic valve gradient is elevated - possibilities include a subvalvular membrane or perhaps high flow given the hyperdynamic EF. Consider TEE for additional evaluation of the aortic valve.  ECG - SR, normal ECG    Assessment & Plan  A pleasant 59 year old female  1. Systolic murmur and elevated friends aortic gradient, we will schedule a TEE to distinguish between mild aortic stenosis versus subaortic membrane. Patient agrees, we will discuss further plan after TEE.  2. Blood pressure - well controlled  3. Lipids - at goal, if in fact confirmed aortic stenosis we might consider starting statins.  Follow-up in one year unless abnormality on TEE.    Lars Masson, MD, [facility] 12/29/2014, 11:59 AM

## 2015-01-08 ENCOUNTER — Encounter (HOSPITAL_COMMUNITY): Payer: Self-pay | Admitting: Cardiology

## 2015-01-27 ENCOUNTER — Other Ambulatory Visit: Payer: Self-pay | Admitting: Nurse Practitioner

## 2015-01-27 NOTE — Telephone Encounter (Signed)
Medication refill request: Ambien 10 mg Last AEX:  11/03/14 PG Next AEX: 11/08/15 PG Last MMG (if hormonal medication request): 08/13/14 [id number]:neg Refill authorized: 07/14/14 #30/5R. Today please advise

## 2015-01-28 NOTE — Telephone Encounter (Signed)
RX printed, signed by Ms. Patty and faxed to [facility].

## 2015-04-14 ENCOUNTER — Other Ambulatory Visit: Payer: Self-pay | Admitting: Obstetrics & Gynecology

## 2015-04-14 NOTE — Telephone Encounter (Signed)
Medication refill request: Xanax  Last AEX:  11/03/14 PG Next AEX: 11/08/15 PG Last MMG (if hormonal medication request): 08/13/14 BIRADS1:Neg Refill authorized: 01/01/15 #30 w/ 1R. Today please advise.

## 2015-04-22 ENCOUNTER — Other Ambulatory Visit: Payer: Self-pay | Admitting: Nurse Practitioner

## 2015-04-22 NOTE — Telephone Encounter (Signed)
Medication refill request: Skelaxin 800 mg  Last AEX:  11/03/14 with PG  Next AEX:  11/08/15 with PG  Last MMG (if hormonal medication request): n/a Refill authorized: Please advise.

## 2015-05-25 ENCOUNTER — Encounter (HOSPITAL_COMMUNITY): Payer: Self-pay | Admitting: Psychology

## 2015-05-26 ENCOUNTER — Other Ambulatory Visit (HOSPITAL_COMMUNITY): Attending: Psychiatry | Admitting: Psychology

## 2015-05-26 DIAGNOSIS — F1020 Alcohol dependence, uncomplicated: Secondary | ICD-10-CM

## 2015-05-27 NOTE — Progress Notes (Signed)
    Daily Group Progress Note  Program: CD-IOP   Group Time: 1-2:30 pm  Participation Level: Active  Behavioral Response: Sharing  Type of Therapy: Process Group  Topic: Process: the first part of group was spent in process. Members shared about what they had done to support their recovery, including attending meetings, working with their sponsor and doing step-work. also requested in these disclosures are things that promote good self-care, including going to the gym, eating well, and learning to live in a clean and sober way. Drug tests were collected from a few of the members today. Also present was a new group member who introduced herself to the group.   Group Time: 2:45- 4pm  Participation Level: Active  Behavioral Response: Appropriate and Sharing  Type of Therapy: Psycho-education Group  Topic: Serenity Prayer: What you can and cannot change/Graduation. The second half of group was spent in a psycho-ed. Handouts were provided that included the Serenity Prayer and then asked that the patient list 5 things they can change and 5 things they cannot change. The rest of the session included members sharing what they had written on their handouts. At the end of the session, a graduation ceremony was held honoring a group member who was completing the program and moving onto the next phase of this new lifestyle of sobriety. Kind words were spoken and it was a very touching heartfelt ending to this group session.    Summary: The patient was new to the group and introduced herself during process. She had already met one of the group members at the 10 am AA meeting, but received a warm welcome from her other new fellow group members. She recounted how after her husband's sudden and unexpected death, she went into a tailspin and begin self-medicating to ease her pain. She had noticed she was drinking more than before, but her intake continued to increase. Her children finally confronted her  about her drinking and she entered a facility in [place] and completed an alcohol detox. The patient reported she had already been to 2 AA meetings and picked up a starter chip. When asked during the psycho-ed about what she can or cannot change, the patient reported, "I can't change Tim's death", referring to her husband's death almost 3 years ago. She was very open about herself and life in this first group session and seemed very comfortable here. She had kind words of hope for the graduating member and she responded very well to this first group session. The patient's sobriety date remains 6/23.   Family Program: Family present? No   Name of family member(s):   UDS collected: Yes Results: pending  AA/NA attended?: YesTuesday and Wednesday  Sponsor?: No, but she is very new to recovery and will be given some time to secure one.   Kaladin Noseworthy, LCAS

## 2015-05-27 NOTE — Progress Notes (Signed)
Jasmine Richard is a 59 y.o. female patient. Orientation to CD-IOP: The patient is a 5 you widowed, Caucasian, woman seeking entry into the CD-IOP. She lives alone here in [place]. The patient was discharged this past Sunday after successfully completing an alcohol detox in [place], [place]. Upon returning to [facility], she contacted her PCP, Merlene Laughter, MD and he instructed her to seek outpatient treatment here at [facility]. The patient reported she and her husband had not been drinkers when their children were young, but they enjoyed a cocktail or glass of wine in later years.  At best, she was a social drinker, but in 2013, her husband collapsed after suffering a cerebral aneurysm while on a business trip to [place]. The family gathered in [place] and he died, 5 days later, on 08-25-24after the family instructed the life support to be disconnected. The patient reported she was completely devastated with his sudden death and as time passed, she began 'self-medicating' to ease the pain. Last week, the patient was having the king-size bed moved out of the master bedroom and 2 friends were with her. At one point, she had trouble speaking clearly and when 1 of the friends smelled the ginger ale bottle the patient was drinking out of, she discovered it was wine.  They phoned the patient's daughter and when she came to the house, her mother lied to her about the wine. The patient was, subsequently, confronted by her children and agreed to a detox. The patient admitted that she wanted to go somewhere away from town so she wouldn't see anyone she knew, but today she admitted that this was foolish thinking and didn't help the continuum of care. She presented today as understanding and accepting of her status as alcohol dependent and determined to do whatever it takes to address this problem.  In addition to her alcohol dependence, the patient is also diagnosed with depression and GAD. In 2002, the patient  underwent a gastric by-pass. She has been very dedicated to keeping the weight off and goes to the gym almost every day, works with a trainer and is very conscious of her diet. When asked about any genetic predisposition, the patient reported that her maternal grandfather's brother was an alcoholic. She knew of no other CD in the family, but reported that her brother is obese (400 lbs.) and a paternal uncle weighed almost 600 lbs. at the time of his death. The patient has strong support from her 3 adult children and their spouses. Her daughter and son-in-law live in [place] while her 2 sons live in [place] and [place] with their families.  She has a strong faith and attends the same church she and her husband had been members of for over 30 years. The documentation was reviewed, signed and the orientation completed accordingly. The patient will return tomorrow and begin the CD-IOP.         Laasya Peyton, LCAS

## 2015-05-28 ENCOUNTER — Encounter (HOSPITAL_COMMUNITY): Payer: Self-pay | Admitting: Medical

## 2015-05-28 ENCOUNTER — Other Ambulatory Visit (HOSPITAL_COMMUNITY): Attending: Psychiatry | Admitting: Psychology

## 2015-05-28 DIAGNOSIS — F329 Major depressive disorder, single episode, unspecified: Secondary | ICD-10-CM | POA: Diagnosis not present

## 2015-05-28 DIAGNOSIS — Z8249 Family history of ischemic heart disease and other diseases of the circulatory system: Secondary | ICD-10-CM | POA: Insufficient documentation

## 2015-05-28 DIAGNOSIS — Z9884 Bariatric surgery status: Secondary | ICD-10-CM | POA: Diagnosis not present

## 2015-05-28 DIAGNOSIS — T7432XA Child psychological abuse, confirmed, initial encounter: Secondary | ICD-10-CM | POA: Insufficient documentation

## 2015-05-28 DIAGNOSIS — F411 Generalized anxiety disorder: Secondary | ICD-10-CM | POA: Diagnosis not present

## 2015-05-28 DIAGNOSIS — F900 Attention-deficit hyperactivity disorder, predominantly inattentive type: Secondary | ICD-10-CM | POA: Insufficient documentation

## 2015-05-28 DIAGNOSIS — M069 Rheumatoid arthritis, unspecified: Secondary | ICD-10-CM | POA: Insufficient documentation

## 2015-05-28 DIAGNOSIS — G43101 Migraine with aura, not intractable, with status migrainosus: Secondary | ICD-10-CM | POA: Insufficient documentation

## 2015-05-28 DIAGNOSIS — F1020 Alcohol dependence, uncomplicated: Secondary | ICD-10-CM | POA: Diagnosis not present

## 2015-05-28 DIAGNOSIS — K219 Gastro-esophageal reflux disease without esophagitis: Secondary | ICD-10-CM | POA: Diagnosis not present

## 2015-05-28 DIAGNOSIS — G894 Chronic pain syndrome: Secondary | ICD-10-CM | POA: Insufficient documentation

## 2015-05-28 DIAGNOSIS — G4700 Insomnia, unspecified: Secondary | ICD-10-CM | POA: Insufficient documentation

## 2015-05-28 DIAGNOSIS — F4310 Post-traumatic stress disorder, unspecified: Secondary | ICD-10-CM | POA: Insufficient documentation

## 2015-05-28 MED ORDER — BUSPIRONE HCL 10 MG PO TABS
10.0000 mg | ORAL_TABLET | Freq: Three times a day (TID) | ORAL | Status: DC
Start: 2015-05-28 — End: 2015-06-09

## 2015-05-28 MED ORDER — ISOMETHEPTENE-DICHLORAL-APAP 65-100-325 MG PO CAPS: 1.0000 | ORAL_CAPSULE | ORAL | Status: DC | PRN

## 2015-05-28 NOTE — Progress Notes (Signed)
Substance Abuse History in the last 12 months: Substance Age of 1st Use Last Use Amount Specific Type   Psychiatric Initial Adult Assessment   Patient Identification: Jasmine Richard MRN:  [id number] Date of Evaluation:  05/28/2015 Referral Source: PCP Dr Felipa Eth Chief Complaint:   Chief Complaint    Alcohol Problem     Visit Diagnosis: No diagnosis found. Diagnosis:   Patient Active Problem List   Diagnosis Date Noted  . Alcohol use disorder, severe, dependence [F10.20] 05/27/2015  . 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 Chronic migraine without aura without status migrainosus, not intractable [G43.709] 12/29/2014  . Aortic stenosis [I35.0] 12/29/2014  . Duodenogastric reflux [K21.9] 12/29/2014  . Systolic murmur [J19.1] 47/82/9562  . GERD (gastroesophageal reflux disease) [K21.9] 02/01/2013  . Insomnia [G47.00] 02/01/2013  . Psoriatic arthritis [L40.50] 02/01/2013  . S/P gastric bypass [Z98.84] 02/01/2013   History of Present Illness:           : The patient is a 59 yo widowed, Caucasian, woman seeking entry into the CD-IOP. She lives alone here in [place]. The patient was discharged this past Sunday after successfully completing an alcohol detox in [place], [place]. Upon returning to [facility], she contacted her PCP, Lajean Manes, MD and he instructed her to seek outpatient treatment here at [facility]. The patient reported she and her husband had not been drinkers when their children were young, but they enjoyed a cocktail or glass of wine in later years.  At best, she was a social drinker, but in 2013, her husband collapsed after suffering a cerebral aneurysm while on a business trip to [place]. The family gathered in [place] and he died, 5 days later, on 2023-07-15 after the family instructed the life support to be disconnected. The patient reported she was completely devastated with his sudden death and as time passed, she began  'self-medicating' to ease the pain. Last week, the patient was having the king-size bed moved out of the master bedroom and 2 friends were with her. At one point, she had trouble speaking clearly and when 1 of the friends smelled the ginger ale bottle the patient was drinking out of, she discovered it was wine.  They phoned the patient's daughter and when she came to the house, her mother lied to her about the wine. The patient was, subsequently, confronted by her children and agreed to a detox. The patient admitted that she wanted to go somewhere away from town so she wouldn't see anyone she knew, but today she admitted that this was foolish thinking and didn't help the continuum of care. She presented today as understanding and accepting of her status as alcohol dependent and determined to do whatever it takes to address this problem.  In addition to her alcohol dependence, the patient is also diagnosed with depression and GAD. In 2002, the patient underwent a gastric by-pass. She has been very dedicated to keeping the weight off and goes to the gym almost every day, works with a trainer and is very conscious of her diet. When asked about any genetic predisposition, the patient reported that her maternal grandfather's brother was an alcoholic. She knew of no other CD in the family, but reported that her brother is obese (400 lbs.) and a paternal uncle weighed almost 600 lbs. at the time of his death. The patient has strong support from her 3 adult children and their spouses. Her daughter and son-in-law live in [place] while her 2  sons live in [place] and [place] with their families.  She has a strong faith and attends the same church she and her husband had been members of for over 30 years. The documentation was reviewed, signed and the orientation completed accordingly. The patient will return tomorrow and begin the CD-IOP.  Pt reports she has never had a sense of self esteem relying on her husband who  she met at 80 yrs of age.She has no knowledge of chemical dependency or codependency in her family.After her husband's death she began to rely on xanax to attend social functions but preferred to remain home isolated and drinking. She gives a hx of RA and Migraines for which she had a prescription for Hydrocodone that she finds necessary to relieve her pain . Her Imitrex only relieves the aura (?)Her pharmacist son "threw out" this prescription.She is no longer taking xanax.She also had rx for Ambien but has been put on Trazodone for sleep now. She was started on Lexapro at Detox center.Due to her gastric bypass she is only taking 5 mg to start and is planning to increase to 10 mg next week.She would like to treplace her Hydrocodone and xanax with nonaddictive alternatives.     Associated Signs/Symptoms: Depression Symptoms:  depressed mood,low self esteem, sleep disturbance, appetite decreased(improving now) (Hypo) Manic Symptoms:  none Anxiety Symptoms:  Panic Symptoms, Obsessive Compulsive Symptoms:   None,, Social Anxiety, Specific Phobias,Claustrophobia Psychotic Symptoms:Alcohol use related only  Delusions, Hallucinations: None Ideas of Reference, Paranoia, none PTSD Symptoms: Had a traumatic exposure:  Husband had CVA August 2013-5 days later she and family took hinm off life support Had a traumatic exposure in the last month:  Children put her in car and took her to [facility] for detox unsuspecting. On return home she found children had removed husbands clothing she had kept in closet Re-experiencing:  Intrusive Thoughts Hypervigilance:  Negative Hyperarousal:  Irritability/Anger Sleep Avoidance:  Decreased Interest/Participation  Past Medical History:  Past Medical History  Diagnosis Date  . GERD (gastroesophageal reflux disease)   . Psoriatic arthritis     Dr. Gavin Pound  . Osteoarthritis     Past Surgical History  Procedure Laterality Date  . Cesarean section      x2   . Tonsillectomy and adenoidectomy  1960  . Hysteroscopy w/d&c  07    no polyps  . Gastric bypass  2002    lost 140 lb  . Cholecystectomy  1984  . Colonoscopy  2005    recheck in 5 years  . Tarsal metatarsal arthrodesis  11/28/2012    Procedure: TARSAL METATARSAL FUSION;  Surgeon: Colin Rhein, MD;  Location: [place];  Service: Orthopedics;  Laterality: Right;  right 2nd and 3rd TMT joint fusion local bone graft stress x ray foot   . Ulnar tunnel release  09/2014  . Carpal tunnel release  09/2014  . Tee without cardioversion N/A 01/07/2015    Procedure: TRANSESOPHAGEAL ECHOCARDIOGRAM (TEE);  Surgeon: Dorothy Spark, MD;  Location: [facility];  Service: Cardiovascular;  Laterality: N/A;    Substance Abuse History in the last 12 months: Substance Age of 1st Use Last Use Amount Specific Type  Nicotine  never 0 0 0  Alcohol 16 05/19/15 72 oz wine  Cannabis 0 0 0 0  Opiates ? 04/2015 RX/denies abuse Hydrocodone  Cocaine 0 0 0 0  Methamphetamines 0 0 0 0  LSD 0 0 0 0  Ecstasy 0 0 0 0  Benzodiazepines 56  05/22/15 0.25 mg Xanax  Caffeine      Inhalants      Others:                          Family History:  Family History  Problem Relation Age of Onset  . Cervical cancer Mother   . Hypertension Father   . Heart failure Father   . Hypertension Brother     also with hip replacements  . Hyperlipidemia Brother    Social History:   History   Social History  . Marital Status: Widowed    Spouse Name: N/A  . Number of Children: 3  . Years of Education: N/A   Social History Main Topics  . Smoking status: Never Smoker   . Smokeless tobacco: Never Used  . Alcohol Use: Yes     Comment: occ  . Drug Use: No  . Sexual Activity: No     Comment: husband passed 06/2012   Other Topics Concern  . None   Social History Narrative   Additional Social History:   Musculoskeletal: Strength & Muscle Tone: within normal limits Gait & Station: normal Patient  leans: N/A  Psychiatric Specialty Exam: HPI see above  ROS General:  No chills, fever, night sweats or weight changes.  Cardiovascular:  No chest pain, dyspnea on exertion, edema, orthopnea, palpitations, paroxysmal nocturnal dyspnea. Dermatological: No rash, lesions/masses Respiratory: No cough, dyspnea Urologic: No hematuria, dysuria Abdominal:   No nausea, vomiting, diarrhea, bright red blood per rectum, melena, or hematemesis Neurologic:  No visual changes, wkns,  Psychiatric:+anxiety;depression;alcohol dependence;insomnia;PTSD/                               - SI;HI, Hallucinations;psychosis  Last menstrual period 04/19/2011.There is no weight on file to calculate BMI.  General Appearance: Fairly Groomed  Eye Contact:  Good  Speech:  Clear and Coherent  Volume:  Normal  Mood:  Anxious and Dysphoric PHQ9-14  Affect:  Congruent  Thought Process:  Coherent and emotional  Orientation:  Full (Time, Place, and Person)  Thought Content:  WDL and Rumination  Suicidal Thoughts:  No  Homicidal Thoughts:  No  Memory:  Affected by drinking temporarily at timedes esp with blackouts  Judgement:  Impaired  Insight:  Lacking  Psychomotor Activity:  Normal  Concentration:  Good  Recall:  Good  Fund of Knowledge:Good  Language: Good  Akathisia:  Negative  Handed:  Right  AIMS (if indicated):  na  Assets:  Desire for Improvement Financial Resources/Insurance Housing Resilience Social Support  ADL's:  Intact  Cognition: WNL  Sleep:  Requires sleep aid-rx trazodone  ambien stopped   Is the patient at risk to self?  No. Has the patient been a risk to self in the past 6 months?  No. Has the patient been a risk to self within the distant past?  No. Is the patient a risk to others?  No. Has the patient been a risk to others in the past 6 months?  No. Has the patient been a risk to others within the distant past?  No.  Allergies:  No Known Allergies Current Medications: Current  Outpatient Prescriptions  Medication Sig Dispense Refill  . adalimumab (HUMIRA) 40 MG/0.8ML injection Inject 40 mg into the skin every 14 (fourteen) days.    . ALPRAZolam (XANAX) 0.5 MG tablet TAKE 1 TABLET AT BEDTIME AS NEEDED FOR ANXIETY. (Patient not  taking: Reported on 05/27/2015) 30 tablet 1  . Ascorbic Acid (VITAMIN C) 1000 MG tablet Take 1,000 mg by mouth daily.    Marland Kitchen BIOTIN PO Take 1 tablet by mouth daily.    . busPIRone (BUSPAR) 10 MG tablet Take 1 tablet (10 mg total) by mouth 3 (three) times daily. 90 tablet 0  . Calcium Carbonate-Vit D-Min (CALCIUM 1200 PO) Take 1 tablet by mouth 2 (two) times daily.    . cetirizine (ZYRTEC) 10 MG tablet Take 10 mg by mouth daily.    . Cholecalciferol (VITAMIN D3) 2000 UNITS TABS Take 1 tablet by mouth 2 (two) times daily.    . cyanocobalamin (,VITAMIN B-12,) 1000 MCG/ML injection Inject 1 mL (1,000 mcg total) into the muscle every 30 (thirty) days. 10 mL 1  . folic acid (FOLVITE) 1 MG tablet Take 2 mg by mouth 2 (two) times daily.    Marland Kitchen GLUCOSAMINE-CHONDROITIN PO Take by mouth 2 (two) times daily.    Marland Kitchen HYDROcodone-acetaminophen (VICODIN) 5-500 MG per tablet Take 1 tablet by mouth every 6 (six) hours as needed. (Patient not taking: Reported on 05/27/2015) 30 tablet 0  . isometheptene-acetaminophen-dichloralphenazone (MIDRIN) 65-100-325 MG capsule Take 1 capsule by mouth every 4 (four) hours as needed for migraine. Maximum 5 capsules in 12 hours for migraine headaches, 8 capsules in 24 hours for tension headaches. 30 capsule 2  . MAGNESIUM PO Take 250 mg by mouth 2 (two) times daily.    . meloxicam (MOBIC) 15 MG tablet Take 15 mg by mouth daily.    . metaxalone (SKELAXIN) 800 MG tablet TAKE 1 TABLET 3 TIMES A DAY. 40 tablet 1  . methotrexate (RHEUMATREX) 2.5 MG tablet Take 12 mg by mouth once a week.    . MULTIPLE VITAMIN PO Take 0.5 tablets by mouth 2 (two) times daily.    Marland Kitchen omeprazole (PRILOSEC) 40 MG capsule Take 40 mg by mouth 2 (two) times daily.     Marland Kitchen POTASSIUM PO Take 250 mg by mouth 2 (two) times daily.    . Probiotic Product (PROBIOTIC PO) Take 1 tablet by mouth daily.    . Pyridoxine HCl (VITAMIN B6 PO) Take 400 mg by mouth 2 (two) times daily as needed.    . SUMAtriptan (IMITREX) 100 MG tablet May repeat in 2 hours if headache persists or recurs. 10 tablet 5  . zolpidem (AMBIEN) 10 MG tablet TAKE 1 TABLET AT BEDTIME AS NEEDED FOR SLEEP. (Patient not taking: Reported on 05/27/2015) 30 tablet 5   No current facility-administered medications for this visit.    Previous Psychotropic Medications: Yes see list  Substance Abuse History in the last 12 months:  Yes.    See above Consequences of Substance Abuse: Medical Consequences:  None known Legal Consequences:  None Family Consequences:  3 children 33/31/27 upset and frightened Blackouts:   DT's: Withdrawal Symptoms:   Tremors  Medical Decision Making:  Review and summation of old records (2), New Problem, with no additional work-up planned (3), Review of Medication Regimen & Side Effects (2) and Review of New Medication or Change in Dosage (2)  Treatment Plan Summary: Admit [place] CD IOP Labs: UDS per protocol;Referrals: Other: none Rx Buspar trial for anxiety;Midrin for migraine pain FU prn    Darlyne Russian 7/1/20163:57 PM

## 2015-06-01 ENCOUNTER — Encounter (HOSPITAL_COMMUNITY): Payer: Self-pay | Admitting: Psychology

## 2015-06-01 NOTE — Progress Notes (Signed)
    Daily Group Progress Note  Program: CD-IOP   Group Time: 1-2:30 pm  Participation Level: Active  Behavioral Response: Appropriate  Type of Therapy: Activity Group  Topic: Activity: the first part of group was spent in a chair yoga class. Members walked down to the gym where a certified yoga teacher awaited them. Chairs were placed in a circle and members sat and followed her directions. The importance of learning to reduce stress and tension throughout one's day was emphasized. These positions and movements reduce tension and stress throughout one's body. At the conclusion of the session, members went back up to the group room and a 15 minute break followed.   Group Time: 2:45- 4pm  Participation Level: Active  Behavioral Response: Sharing  Type of Therapy: Process Group  Topic: Process/Graduation: the second half of group was spent ion process. Members shared about current issues and challenges to their recovery and identified what they had done to support and strengthen their daily program of sobriety. During the part of group, the program director met with group members, including one new group member and two members who are discharging today or next week. At the conclusion of group today, a graduation ceremony was held honoring a successfully graduating group member. Her mother joined the group and was witness to the kind words of respect and encouragement were shared by her daughter's fellow group members. The session was emotional and very touching with the graduating member sharing her own words of hope and support for those she leaves behind.   Summary: The patient was attentive and engaged enthusiastically in the yoga class. She met with the program director as the second half off group began. She had attended an [place] meeting since the last group session and had enjoyed the group very much. Another member encouraged her to attend as many meetings as possible and experience  different formats and many different types of people and stories about recovery. The patient had kind words for the graduating member and encouraged her to remain strong and vigilant as she moves into this new phase of the rest of her life. The patient responded well to this intervention and remains very open to suggestion and direction in just her second group session. She made some good comments and her sobriety date is 6/23.    Family Program: Family present? No   Name of family member(s):   UDS collected: No Results:   AA/NA attended?: [place]  Sponsor?: No, but she is new to the program   Xylon Croom, LCAS

## 2015-06-02 ENCOUNTER — Other Ambulatory Visit (HOSPITAL_COMMUNITY): Admitting: Psychology

## 2015-06-02 DIAGNOSIS — F4310 Post-traumatic stress disorder, unspecified: Secondary | ICD-10-CM

## 2015-06-02 DIAGNOSIS — F1020 Alcohol dependence, uncomplicated: Secondary | ICD-10-CM | POA: Diagnosis not present

## 2015-06-02 DIAGNOSIS — T7432XS Child psychological abuse, confirmed, sequela: Secondary | ICD-10-CM

## 2015-06-02 DIAGNOSIS — Z9884 Bariatric surgery status: Secondary | ICD-10-CM

## 2015-06-04 ENCOUNTER — Other Ambulatory Visit (HOSPITAL_COMMUNITY): Admitting: Psychology

## 2015-06-04 DIAGNOSIS — F1020 Alcohol dependence, uncomplicated: Secondary | ICD-10-CM

## 2015-06-04 DIAGNOSIS — T7432XS Child psychological abuse, confirmed, sequela: Secondary | ICD-10-CM

## 2015-06-04 DIAGNOSIS — F4310 Post-traumatic stress disorder, unspecified: Secondary | ICD-10-CM

## 2015-06-04 DIAGNOSIS — Z9884 Bariatric surgery status: Secondary | ICD-10-CM

## 2015-06-06 ENCOUNTER — Encounter (HOSPITAL_COMMUNITY): Payer: Self-pay | Admitting: Psychology

## 2015-06-06 NOTE — Progress Notes (Signed)
    Daily Group Progress Note  Program: CD-IOP   Group Time: 1-2:30 pm  Participation Level: Active  Behavioral Response: Appropriate and Sharing  Type of Therapy: Psycho-education Group  Topic: Psycho-Ed: Visit with Chaplain. The first part of group was spent in a psycho-ed with a visiting chaplain. After introductions, a mindfulness exercise with a "raisin" was led by the visitor. A discussion ensued at the conclusion of this exercise with group members providing feedback about their own experience during the exercise. This followed with a more in-depth conversation with the chaplain and specific members about their feelings and 'practicing" sharing them with others as opposed to stuffing or hiding them. The session was intense with all in the room very present and learning the value and comfort that comes from being honest and open about one's feelings.   Group Time: 2:45- 4pm  Participation Level: Active  Behavioral Response: Appropriate  Type of Therapy: Process Group  Topic: Process: the second half of group consisted of members disclosing what they have been doing in their recovery and the specific things they do every day to reinforced and strengthen this new lifestyle. During the session today, the medical director met with the 2 newest group members.  Summary: The patient shared openly about her own life with the chaplain and the terrible loss of her husband almost 3 years ago. She admitted that although her husband had had a career in [employer], he had learned to be more open and expressive of his own feelings through the years. In process, the patient reported she had attended 2 AA meetings since our last session. Both topics had been about "God's Will" and she had tried to address this in her own life. She was able to 'Face-Time" with her grandson, who lives in [place] and this brought her great joy. He is her only grandchild at this time. The patient made some good  comments and continues to make good progress in her very early recovery. She responded well to this intervention and her sobriety date remains 6/23.    Family Program: Family present? No   Name of family member(s):   UDS collected: No Results:  AA/NA attended?: YesThursday and Friday  Sponsor?: No, but she is looking for one   Daymon Hora, LCAS

## 2015-06-07 ENCOUNTER — Encounter (HOSPITAL_COMMUNITY): Payer: Self-pay | Admitting: Psychology

## 2015-06-07 ENCOUNTER — Other Ambulatory Visit (HOSPITAL_COMMUNITY): Admitting: Psychology

## 2015-06-07 DIAGNOSIS — F1020 Alcohol dependence, uncomplicated: Secondary | ICD-10-CM | POA: Diagnosis not present

## 2015-06-07 DIAGNOSIS — T7432XS Child psychological abuse, confirmed, sequela: Secondary | ICD-10-CM

## 2015-06-07 DIAGNOSIS — F4310 Post-traumatic stress disorder, unspecified: Secondary | ICD-10-CM

## 2015-06-07 DIAGNOSIS — Z9884 Bariatric surgery status: Secondary | ICD-10-CM

## 2015-06-08 NOTE — Progress Notes (Signed)
Jasmine Richard is a 59 y.o. female patient. CD-IOP: Individual Session - Treatment Planning Session. I met with the patient this afternoon as scheduled for her first individual counseling session. She reported a busy weekend and that she attended 2 AA meetings over the weekend. She continues to seek a sponsor, but has a lady in mind. She is new to recovery and just getting to know other women in recovery. Her home group is the 10 am meeting at the [facility]. I explained the importance of setting goals for treatment and the patient was very agreeable to this. She reported that staying alcohol-free was her primary goal and she recognizes and accepts that she cannot do it alone. The patient was willing to attend AA meetings and had actually gone to 2 meetings before we first met for her orientation. When asked about what else she might want to work on while she is here in the program, the patient identified addressing her low self-esteem. She has struggled with feelings about her sense of self and admitted in a previous session, that her sense of self-worth was largely derived by being with her husband. She had also been overweight for much of her life and in 2002 she decided to have a gastric bypass. The patient has worked hard to keep her weight down and done very well to maintain the weight loss. She goes to a physical trainer 3 times per week and is very careful about her weight. We agreed that we would work on improving her sense of self and I explained that an intrinsic sense of worth is essential. Many people derive their worth from extrinsic factors, or things like accomplishments or people-pleasing, but that makes for a very shaky self-worth dependent on outer circumstances. The patient agreed and we will talk about things she can do to improve her self-esteem in future sessions. The documentation was reviewed, signed and the treatment plan completed accordingly. We will meet every Tuesday at 1:30  going forward. The patient has displayed good commitment and motivation for sobriety and is an active and engaged group member. Her sobriety date is 6/23.        Edvin Albus, LCAS

## 2015-06-09 ENCOUNTER — Encounter (HOSPITAL_COMMUNITY): Payer: Self-pay | Admitting: Psychology

## 2015-06-09 ENCOUNTER — Other Ambulatory Visit (HOSPITAL_COMMUNITY): Admitting: Psychology

## 2015-06-09 ENCOUNTER — Other Ambulatory Visit (HOSPITAL_COMMUNITY): Payer: Self-pay | Admitting: Medical

## 2015-06-09 DIAGNOSIS — Z9884 Bariatric surgery status: Secondary | ICD-10-CM

## 2015-06-09 DIAGNOSIS — F1020 Alcohol dependence, uncomplicated: Secondary | ICD-10-CM | POA: Diagnosis not present

## 2015-06-09 DIAGNOSIS — T7432XS Child psychological abuse, confirmed, sequela: Secondary | ICD-10-CM

## 2015-06-09 DIAGNOSIS — G4700 Insomnia, unspecified: Secondary | ICD-10-CM

## 2015-06-09 MED ORDER — TRAZODONE HCL 50 MG PO TABS: 50.0000 mg | ORAL_TABLET | Freq: Every evening | ORAL | Status: DC | PRN

## 2015-06-09 MED ORDER — ESCITALOPRAM OXALATE 20 MG PO TABS: 20.0000 mg | ORAL_TABLET | Freq: Every day | ORAL | Status: AC

## 2015-06-11 ENCOUNTER — Other Ambulatory Visit (HOSPITAL_COMMUNITY): Admitting: Licensed Clinical Social Worker

## 2015-06-11 DIAGNOSIS — F1020 Alcohol dependence, uncomplicated: Secondary | ICD-10-CM

## 2015-06-14 ENCOUNTER — Encounter (HOSPITAL_COMMUNITY): Payer: Self-pay | Admitting: Psychology

## 2015-06-14 ENCOUNTER — Other Ambulatory Visit (HOSPITAL_COMMUNITY): Admitting: Psychology

## 2015-06-14 ENCOUNTER — Encounter (HOSPITAL_COMMUNITY): Payer: Self-pay | Admitting: Licensed Clinical Social Worker

## 2015-06-14 DIAGNOSIS — F1020 Alcohol dependence, uncomplicated: Secondary | ICD-10-CM | POA: Diagnosis not present

## 2015-06-14 DIAGNOSIS — F4310 Post-traumatic stress disorder, unspecified: Secondary | ICD-10-CM

## 2015-06-14 NOTE — Progress Notes (Signed)
    Daily Group Progress Note  Program: CD-IOP   Group Time: 1-2:30 pm  Participation Level: Active  Behavioral Response: Appropriate and Sharing  Type of Therapy: Process Group  Topic: Process: the first half of group was spent in process. Members shared about current issues and concerns in early recovery. They were asked to disclose what they had done since Friday to address and support their recovery. These includes:  12-Step meetings, talking to others in recovery, meeting with sponsor, Step Work, exercise and having fun in clean and sober ways.   Group Time: 2:45- 4pm  Participation Level: Active  Behavioral Response: Appropriate and Sharing  Type of Therapy: Psycho-education Group  Topic: Psycho-Ed: Cognitive Distortions: Wrap Up. The second half of group was spent reviewing the cognitive distortions that have been taught by our visiting facilitator. A handout was provided and then a "Jeopardy Game" was held with members breaking up into 2 teams and competing against each other. This proved to be an excellent way to test and underscore the 14 different cognitive distortions the training has consisted of. There was good feedback and discussion among group members. Drug tests were collected today.   Summary: The patient reported a good weekend. She had helped out at church, attended the 4 pm AA meeting at [facility] and worked out with her trainer this morning. The patient explained she has a lot of support from family and friends and is held accountable by many. She reminded the group that living alone makes it important for her to build accountability since no one really knows what she is doing. She was engaged in the psycho-ed, but had not been to any of the previous trainings with the guest speaker. Despite this, she displayed good insight and was able to assist her "team" in the contest. The patient continues to make good progress in her early recovery. She responded well to  this intervention and her sobriety date remains 6/23.   Family Program: Family present? No   Name of family member(s):   UDS collected: No Results:  AA/NA attended?: [place]  Sponsor?: No, but she is looking for a sponsor   Lamarcus Spira, LCAS

## 2015-06-14 NOTE — Progress Notes (Addendum)
    Daily Group Progress Note  Program: CD-IOP   Group Time: 1-2:30  Participation Level: Active  Behavioral Response: Appropriate and Sharing  Type of Therapy: Process Group  Topic: Process: the first part of group was spent in process. Members shared about the past weekend and the activities that supported their early recovery. Present today was a new group member and she introduced herself and shared a little about her history with her new fellow group members. She received a warm welcome and 2 of the female group members gave her a list of AA meetings they attend.  Group Time: 2:45-4  Participation Level: Active  Behavioral Response: Appropriate and Sharing  Type of Therapy: Psycho-education Group  Topic: Psycho-Ed: visit with the Pharmacist. The second half of group was spent in a psycho-ed with Peggye Fothergill, a psychiatrist from upstairs in [facility]. She discussed various types of drugs and explained the effects on one's mind and body. The visitor also discussed different types of medications, including anti-depressants, anxiety medications, pain meds as well as those for sleep. Members asked good questions and provided good feedback when appropriate. At the conclusion of the session, group members agreed that the session had proven very informative.   Summary:  The patient reported a busy weekend. She had worked hard to "purge" her house and continues to prepare for putting it up for sale and moving into a smaller more manageable house. She admitted that her efforts had been "stressful and emotional". I encouraged her to consider having family or friends with her while she does this and she pointed out that her daughter and son were with her on Thursday and Friday. A friend came over this weekend and helped her rearrange things. The patient reported she had attended 3 AA meetings over the weekend. She had seen another group member at one of the meetings. The patient was inviting and  welcoming to the new group member. During the psycho-ed, the patient was attentive and made some good observations. She continues to make excellent progress in her recovery and responded well to this intervention. Her sobriety date remains 6/23.  Family Program: Family present? No   Name of family member(s):   UDS collected: No Results:   AA/NA attended?: Yes, 3 - Friday, Saturday and Sunday  Sponsor?: No   Fransheska Willingham S, Licensed Cli

## 2015-06-14 NOTE — Progress Notes (Signed)
    Daily Group Progress Note  Program: CD-IOP   Group Time: 1-2:30 pm  Participation Level: Active  Behavioral Response: Appropriate and Sharing  Type of Therapy: Process Group  Topic: Process: the first part of group was spent in process. Members shared about current issues and concerns in  early recovery. Each member was asked to identify what they had done to support their new sober lifestyle since the last group session. Present in the session today were 2 new group members. They introduced themselves and shared about what had brought them here. There was good discussion and feedback among new and old group members during this half of group.   Group Time: 2:45- 4pm  Participation Level: Active  Behavioral Response: Appropriate and Sharing  Type of Therapy: Psycho-education Group  Topic: Psycho-Ed: Recovery; Repeating New Behaviors in Early Recovery/Graduation. The second half of group was spent in a psycho-ed focusing on the importance of changing thinking, attitudes and behaviors in early recovery. Special emphasis was given on repeating these new ways of living. A graduation ceremony was held in the last part of this session. A member was leaving the group and program and moving onto this next phase of her new life. Kind words of hope and appreciation were shared during the graduation ceremony.   Summary: The patient reported she had had a good holiday weekend. She had gone to a cookout, went to an outdoor concert and spent time with her children. She reported she had also attended 6 AA meetings since last Friday. I pointed out to other group members the motivation displayed by this member who is newly in recovery and that she had attended more 12-step meetings than anyone else. The patient made some good comments in the psycho-ed and pointed out that embracing a new lifestyle is something she has done in the past and she knows exactly what must occur for that to happen. She shared  about her commitment to working out at the gym and her accountability with her trainer. The patient shared kind words of hope and gratitude with the graduating member. She had taken her out to lunch after the 10 am AA meeting to celebrate her graduation. The patient continues to make good progress in early recovery and her feedback displays a good understanding of her daily recovery needs. She responded well to this intervention and her sobriety date remains 6/23.   Family Program: Family present? No   Name of family member(s):   UDS collected: No Results:   AA/NA attended?: YesMonday, Tuesday, Wednesday, Friday, Saturday and Sunday  Sponsor?: No   Yona Kosek, LCAS

## 2015-06-15 ENCOUNTER — Encounter (HOSPITAL_COMMUNITY): Payer: Self-pay | Admitting: Psychology

## 2015-06-15 ENCOUNTER — Encounter (HOSPITAL_COMMUNITY): Payer: Self-pay | Admitting: Licensed Clinical Social Worker

## 2015-06-15 NOTE — Progress Notes (Signed)
    Daily Group Progress Note  Program: CD-IOP   Group Time: 1-2:30  Participation Level: Active  Behavioral Response: Appropriate and Sharing  Type of Therapy: Process Group  Topic: After checking in, group members shared recovery-related activities and challenges they faced since the last group on Wednesday. They also reported any urges or cravings they may have experienced. Drug tests were returned.    Group Time: 2:45-4  Participation Level: Active  Behavioral Response: Appropriate and Sharing  Type of Therapy: Psycho-education Group  Topic: he second part of group focused on relapse in early recovery. Patients learned strategies to address the top four common reasons for relapse in early recovery:  physical cravings, psychological longing, physical pain, emotional pain. The group exercise was called "write it on the wall." Each patient was given sticky notes to write on and then they placed their sticky notes on 4 sheets of flip-chart paper entitled: People or situations that trigger urges to use, Thoughts of feelings that trigger urges to use, How do you overcome cravings, and What do you like most about yourself when you don't use. All patients participated and reported they enjoyed the activity.        Summary: Patient reported she went to her normal meeting Thursday morning. She did not go this morning because her 2 sons were in town helping her do yard work. She went to a dinner at church Wednesday night. Patient participated in the relapse prevention activity and identified her trigger to use alcohol was to cover emotional pain, "My husband died from an aneurysm at age 33." She has been to grief counseling but is still working through her emotional pain. Patient is regularly going to meetings and shares openly in group. Her sobriety date is 6/23.   Family Program: Family present? No   Name of family member(s):   UDS collected: No Results:  AA/NA attended?:  [place] and Saturday  Sponsor?: Yes   MACKENZIE,LISBETH S, Licensed Cli

## 2015-06-15 NOTE — Progress Notes (Signed)
Jasmine Richard is a 59 y.o. female patient. CD-IOP: Medication management. Per the patient's request, I asked the program director, CK, to increase her Lexapro from 10 to 20 mg. He agreed and the order was placed with her pharmacy. We will continue to follow closely in the days ahead. The patient remains compliant in all respects.         Marieclaire Bettenhausen, LCAS

## 2015-06-15 NOTE — Progress Notes (Signed)
Jasmine Richard is a 59 y.o. female patient.  CD-IOP: Individual Counseling Session. I met with the patient this afternoon as scheduled. We have agreed to meet on Tuesday afternoons at 1:30. This allows the patient to have lunch with other women in recovery after her 10 am AA meeting or gives her the time to go home, get lunch and relax a little before she comes here. She reported the meeting had been good this morninng. She had befriended a new lady in the meeting and they had had lunch afterwards. The patient reported she is feeling a little overwhelmed by all of the AA stuff and feels as if she needs to get a sponsor asap. Her kids are doing well and check-in with her daily. She reported she will continue to go through the house and 'purge' the things to give away or get rid of. She had phoned her realtor about a house she had seen was listed, but by the time he got back with her about it, there was already a contract on it. I asked the patient about her childhood. She reminded me that her mother had suffered from severe stomach problems for the first 12 years of her life. She remembered how skinny her mother was and her little stick legs. I wondered whether she encouraged her children to eat more since she couldn't? On the contrary, the patient responded, "she denied [place] food". She said they frequently went over the their neighbor's house, [place], and she would feed them and give them treats. The patient reported her mother frequently made little comments about her weight and it "made me feel as if I was overweight". She noted that her brother was the favorite child of her mother's. I questioned what had gone on in the household that both children had become obese? The patient had no answer for it except to say that the household was not nurturing, at least not like her own home with her children, had been. She also shared that they had lived in [place] for 3 years. She described the time as one where, "I moved  there a skinny woman and returned 3 years later fat and pregnant". She had the gastric bypass in 2002 and has kept the weight off and been very careful about her diet ever since. The patient reported she does have some things she cannot eat and she also takes a number of supplements for nutritional purposes, but she is very happy she had the surgery and wouldn't change it for anything. We agreed to keep discussing the issues that contributed to her low sense of self in sessions going forward. In the meantime, the patient is making excellent progress in early recovery and attending more meetings than anyone else in the group. She is also reporting a reduction in depressive symptoms. Her sobriety date remains 6/23.         Shalimar Mcclain, LCAS

## 2015-06-15 NOTE — Progress Notes (Signed)
Jasmine Richard is a 59 y.o. female patient. CD-IOP: Individual Counseling Session. I met with the patient as scheduled for her weekly session. She had attended the 10 am AA meeting this morning and it had been a good one. She reported she had not slept well last night despite taking 150 mg of Trazadone. She noted that at the hospital in [place], she had been given 150 mg of Trazadone and not the 50 mg prescribed by our Investment banker, operational. Upon further questioning, the patient admitted she has not slept well since her husband died. She admitted that it was not fear of being alone in that big house by herself, but rather the anxiety of his absence/loss. She continues to work on 'purging' her house, but noted that the idea of moving from where she is very comfortable is a little disconcerting. She admitted he kids are the primary force behind her selling the house and moving to something smaller and more manageable. Especially since she has arthritis and the master bedroom is on the second floor. The patient reported she has 4 devotional books she reads daily in addition to her [facility] and 12 & 12. I gave her a brochure that touches on one's daily thoughts and behaviors and provides a good check-list to insure that one is doing what they are supposed to be doing. I applauded her efforts in early recovery and pointed out that she is attending more [place] meetings than anyone else in the group. She is doing everything we have asked of her and the patient reported that, "I have felt more serene than I have in a long time". She is making good progress in her goals of treatment: she remains alcohol-free, is engaged in the Fellowship of AA and is addressing the symptoms of her depression. I requested that she complete another PHQ-9 and upon further evaluation, I noted that her point total had gone down from 14 on June 28th to 6 on July 19. Her score has been cut in more than half. She had gone up on her Lexapro last week  from 10-20 mg and may be feeling the effects of the anti-depressant. The patient continues to make excellent progress in early recovery and her sobriety date remains 6/23.         Syncere Eble, LCAS

## 2015-06-15 NOTE — Progress Notes (Signed)
    Daily Group Progress Note  Program: CD-IOP   Group Time: 1-2:30 pm  Participation Level: Active  Behavioral Response: Appropriate and Sharing  Type of Therapy: Process Group  Topic: Process: the first half of group was spent in process. Members shared about current issue and concerns in early recovery. They are also asked to disclose what they had done to support or strengthen their recovery, including attending meetings, step work, meeting with sponsor and other activities that promote a healthy drug-free lifestyle.   Group Time: 2:45- 4pm  Participation Level: Active  Behavioral Response: Sharing  Type of Therapy: Psycho-education Group  Topic: Psycho-Ed: the second half of group was spent in a psycho-ed on "The Deactivation of Cravings". A handout was provided describing a scenario with a young man in early recovery who relapses. The group read the handout out loud and then identified just what the fellow had done wrong from the moment he got out of bed that morning. They were very astute and able to identify a number of things he had done that almost 'invited' relapse. The 4-step process of relapse: Trigger-Thought-Craving- Use, was also explained at length. The importance of addressing and challenging thoughts was emphasized. There was good feedback and comments from group members.   Summary: The patient reported she had attended 3 AA meetings since the last group session. She had provided good support to a woman who had [place]. She had gone to the gym for her usual 3 times per week workout with a trainer. She could relate to another member's frustration with some of the older members repeating themselves in every meeting. She had come to the conclusion that they were trying to share their many years of sobriety and wisdom with others. The patient reported she had skyped with some old friends who were gathered in [place]. She had disclosed what she  was going through with them and they seemed very supportive of her efforts. She noted a friend in the picture who ducked out for a moment and she figured that the woman had taken a drink of wine. Everyone laughed because they could relate to this discomfort. The patient provided good feedback to her fellow group members and responded well to this intervention. She is making good progress and doing more than we have even asked of her. The patient's sobriety date remains 6/23.   Family Program: Family present? No   Name of family member(s):   UDS collected: No Results:   AA/NA attended?: YesMonday, Tuesday and Wednesday  Sponsor?: No   Keairra Bardon, LCAS

## 2015-06-16 ENCOUNTER — Other Ambulatory Visit (HOSPITAL_COMMUNITY): Admitting: Psychology

## 2015-06-16 DIAGNOSIS — Z9884 Bariatric surgery status: Secondary | ICD-10-CM

## 2015-06-16 DIAGNOSIS — F1020 Alcohol dependence, uncomplicated: Secondary | ICD-10-CM | POA: Diagnosis not present

## 2015-06-16 DIAGNOSIS — F4310 Post-traumatic stress disorder, unspecified: Secondary | ICD-10-CM

## 2015-06-16 DIAGNOSIS — T7432XS Child psychological abuse, confirmed, sequela: Secondary | ICD-10-CM

## 2015-06-17 ENCOUNTER — Encounter (HOSPITAL_COMMUNITY): Payer: Self-pay | Admitting: Psychology

## 2015-06-17 NOTE — Progress Notes (Signed)
    Daily Group Progress Note  Program: CD-IOP   Group Time: 1-2:30 pm  Participation Level: Active  Behavioral Response: Appropriate and Sharing  Type of Therapy: Process Group  Topic: Process: the first part of group was spent in process. Members shared about current issues and concerns in early recovery. The newest member discussed the challenge she is facing with her S/O who continues to drink. 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 Other group member provided encouragement and support.  Random drug tests were collected.   Group Time: 2:45- 4pm  Participation Level: Active  Behavioral Response: Appropriate and Sharing  Type of Therapy: Psycho-education Group  Topic: Feelings/Graduation: the second half of group was spent in a psycho-ed on the importance of Feelings. Handouts were provided and group members took turns reading from the first page. It was entitled, "Why Become Aware of Feelings", and proceeded to list answers to that same question. The next handout identified specific feelings and the reasons they are felt. Members shared some of their inner feelings and a discussion ensued. As the session neared the end a Graduation Ceremony was held. There were kind words of respect and hope for the graduating member and he responded with gratitude and tears.   Summary: The patient reported she had attended 2 AA meetings since the last group session. This morning she went to the gym and worked out with her trainer, attended the 10 am AA meeting and came here. She continues to spend her free time going through possessions at her home. She intends to put the house on the market later this year. She is all alone there and at 5000 square feet, it is far more than she needs. She continues to do her daily devotionals as she has every morning. The patient was active in the psycho-ed on feelings. She had kind words for the graduating  member and wished him well. This patient continues to make good progress in her recovery. She has immersed herself in AA and is getting to know a number of women in recovery. Accountability is something she is building into her daily life since she knows that living alone, as opposed to most of her fellow group members, gives her some freedom that isn't necessarily good for her recovery. She made some good comments and responded well to this intervention. A drug test was collected from this patient today. Her sobriety date is 6/22.    Family Program: Family present? No   Name of family member(s):   UDS collected: No Results:   AA/NA attended?: YesTuesday and Wednesday  Sponsor?: No   Urian Martenson, LCAS

## 2015-06-18 ENCOUNTER — Other Ambulatory Visit (HOSPITAL_COMMUNITY): Admitting: Licensed Clinical Social Worker

## 2015-06-18 DIAGNOSIS — F4310 Post-traumatic stress disorder, unspecified: Secondary | ICD-10-CM

## 2015-06-18 DIAGNOSIS — F1020 Alcohol dependence, uncomplicated: Secondary | ICD-10-CM | POA: Diagnosis not present

## 2015-06-21 ENCOUNTER — Other Ambulatory Visit (HOSPITAL_COMMUNITY): Admitting: Licensed Clinical Social Worker

## 2015-06-21 ENCOUNTER — Encounter (HOSPITAL_COMMUNITY): Payer: Self-pay | Admitting: Licensed Clinical Social Worker

## 2015-06-21 DIAGNOSIS — F1020 Alcohol dependence, uncomplicated: Secondary | ICD-10-CM

## 2015-06-21 NOTE — Progress Notes (Signed)
    Daily Group Progress Note  Program: CD-IOP   Group Time: 1-2:30  Participation Level: Active  Behavioral Response: Appropriate and Sharing  Type of Therapy: Psycho-education Group/PROCESS GROUP  Topic: After checking in with sobriety dates, group members took turns sharing about recovery-related activities and challenges they faced since the last group on Wednesday. They also shared any urges or cravings they may have experienced.  Group members were openly engaged in suggestions and feedback. A new patient joined the group and introduced himself. One group member graduated today from the program. The patient graduating received accolades from his fellow group members. His wife and son came to celebrate the graduation. Two group members saw program director, Maryjean Morn.   Group Time: 2:45-4  Participation Level: Active  Behavioral Response: Appropriate and Sharing  Type of Therapy: Psycho-education Group  Topic: The second half of group focused on styles of communication. Each patient was able to demonstrate through hands-on experience the relative effectiveness of communicating assertively, aggressively, passive-aggressively, and passively. Patients participated in a listening activity which is a major component of communication. All patients were engaged in all activities.       Summary: Patient reported she went to a meeting Thursday and saw a new group member. She reports she did identify her father as a stressor. Her father has dementia and lives in assisted living. When she visits he treats her like an Geophysicist/field seismologist. She has lots of feelings when she leaves and wants to drink. Other group members gave her good suggestions on dealing with cravings and also dementia.  Patient was open to suggestions and thanked group members for the feedback. Patient participated eagerly in the communication activities. She reported she did especially well in the listening activity. Patient  reports she now has identified that she uses all the styles of communication in different situations. Patient is growing in her recovery and building her platform of recovery. Her sobriety date remains 6/22.   Family Program: Family present? No   Name of family member(s):   UDS collected: No Results:   AA/NA attended?: Yes   Sponsor?: No   MACKENZIE,LISBETH S, Licensed Cli

## 2015-06-22 ENCOUNTER — Encounter (HOSPITAL_COMMUNITY): Payer: Self-pay | Admitting: Licensed Clinical Social Worker

## 2015-06-22 NOTE — Progress Notes (Signed)
    Daily Group Progress Note  Program: CD-IOP   Group Time: 1-2:30  Participation Level: Active  Behavioral Response: Appropriate and Sharing  Type of Therapy: Process Group  Topic: After checking in with sobriety dates, group members took turns sharing about recovery-related activities and challenges they faced since the last group. They also shared any urges or cravings they may have experienced.  Group members were openly engaged in suggestions and feedback. A new group member met with program director, Darlyne Russian, [place].    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 Group members were able to acknowledge how their own addiction affected their family. Group members discovered emotions family members may have felt while they were in active addiction. Group members reported how the use of chemicals had dominated their thoughts, their time and their attention; and they didn't even realize it. There was good group discussion surrounding these emotions.       Summary: Patient reported she went to 2 meetings at the [employer] over the weekend. She picked up her 30 day chip but didn't tell anyone because she "doesn't like to be the center of attention." The group congratulated her on her 30 days of sobriety. She reported she had no urges or cravings over the weekend. She was quiet during the discussion on family and addiction. She reported she only drank alone. She is working on a foundation for recovery. Her sobriety date is 6/22.   Family Program: Family present? No   Name of family member(Richard):   UDS collected: No Results:   AA/NA attended?: YesFriday and Saturday  Sponsor?: Yes   Jasmine Richard,Jasmine Richard, Licensed Cli

## 2015-06-23 ENCOUNTER — Other Ambulatory Visit (HOSPITAL_COMMUNITY): Admitting: Licensed Clinical Social Worker

## 2015-06-23 DIAGNOSIS — F1020 Alcohol dependence, uncomplicated: Secondary | ICD-10-CM | POA: Diagnosis not present

## 2015-06-24 ENCOUNTER — Encounter (HOSPITAL_COMMUNITY): Payer: Self-pay | Admitting: Licensed Clinical Social Worker

## 2015-06-24 NOTE — Progress Notes (Signed)
    Daily Group Progress Note  Program: CD-IOP   Group Time: 1-2:30  Participation Level: Active  Behavioral Response: Appropriate and Sharing  Type of Therapy: Process Group  Topic: After checking in with sobriety dates, group members took turns sharing about recovery-related activities and challenges they faced since the last group. They also shared any urges or cravings they may have experienced.  Group members were openly engaged in suggestions and feedback. A new patient joined the group and introduced herself.   Group Time: 2:45-4  Participation Level: Active  Behavioral Response: Appropriate and Sharing  Type of Therapy: Psycho-education Group  Topic: The second part of group focused on trauma and substance abuse. A guest speaker, Dr. Fleeta Emmer, from [facility] presented through a power point how addiction predisposes people to higher rates of traumas. Inversely, people with trauma also have higher rates of addiction. Dr. Luiz Blare also discussed her foundation and what services are provided:  counseling, case management, skill-building groups, and co-occurring disorder counseling. She gave patients informational material on trauma. Patients actively engaged in the discussion on trauma and asked appropriate questions.     Summary: Patient reported she went to meetings Tuesday and Wednesday. She also went to the gym this morning. She has found a temporary sponsor but is still looking for a permanent sponsor. Patient was quiet during the trauma presentation. Patient is doing what is asked of her and is working her recovery program. Her sobriety date is 6/22.   Family Program: Family present? No   Name of family member(s):   UDS collected: No Results:   AA/NA attended?: [place] and Wednesday  Sponsor?: Yes   Kayden Amend S, Licensed Cli

## 2015-06-25 ENCOUNTER — Other Ambulatory Visit (HOSPITAL_COMMUNITY)

## 2015-06-28 ENCOUNTER — Encounter: Payer: Self-pay | Admitting: Psychiatry

## 2015-06-28 ENCOUNTER — Other Ambulatory Visit (HOSPITAL_COMMUNITY): Attending: Psychiatry | Admitting: Psychology

## 2015-06-28 ENCOUNTER — Other Ambulatory Visit (HOSPITAL_COMMUNITY): Payer: Self-pay | Admitting: Medical

## 2015-06-28 DIAGNOSIS — F900 Attention-deficit hyperactivity disorder, predominantly inattentive type: Secondary | ICD-10-CM | POA: Diagnosis not present

## 2015-06-28 DIAGNOSIS — Z9884 Bariatric surgery status: Secondary | ICD-10-CM | POA: Insufficient documentation

## 2015-06-28 DIAGNOSIS — F1020 Alcohol dependence, uncomplicated: Secondary | ICD-10-CM | POA: Insufficient documentation

## 2015-06-28 MED ORDER — MIRTAZAPINE 15 MG PO TBDP: 15.0000 mg | ORAL_TABLET | Freq: Every day | ORAL | Status: DC

## 2015-06-29 ENCOUNTER — Encounter (HOSPITAL_COMMUNITY): Payer: Self-pay | Admitting: Psychology

## 2015-06-29 NOTE — Progress Notes (Signed)
Jasmine Richard is a 59 y.o. female patient. CD-IOP: Treatment Plan Update. I met with the patient this afternoon for our weekly session and to review her treatment plan. She has now attended 14 group sessions and is slightly over halfway through our program. The patient reported she had attended the 10 am meeting today. She enjoys the [employer] and has met a lot of people in recovery. She noted that, "I must have a welcoming face because people just walk up and introduce themselves to me". I told her they can tell she is a very kind and compassionate person. I explained the importance of reviewing her goals of treatment. I noted that the patient is doing very well in early recovery. She has been very open and transparent about her recovery to her family and friends and has fully accepted her alcoholism and need for sobriety. Relative to her 2nd goal of treatment, the patient is making good progress in building a support network. She is attending 6-9 meetings per week and meeting a lot of women in recovery. When asked about a sponsor, the patient reported she had asked a woman to be her sponsor, but the woman already had 6 sponsee's and admitted that she couldn't handle any more. The patient had found a lot of commonality with this woman, whose own husband had died at an early age. She agreed that she would continue to seek out a temporary sponsor and would probably ask that same woman if she had any recommendations. Regarding the patient's 3rd goal of treatment, she reported she has experienced a significant reduction in her depressive symptoms and is more like a 2-3 versus up near 10. She agreed to complete another PHQ-9 to document her depression. Regarding her self-esteem, the patient reported that she is feeling more at peace and calmer within herself. She attributed that to attending AA and the program and the things she is learning. We both agreed that the Serenity Prayer is a good one. The patient noted  that the first year after her husband's death, she w as in total denial and just ignored the fact that he was gone. In the second year, she was just in a fog. Now there seems to be a growing acceptance with his absence and the recognition that she must carve out a life now without him. The patient is making progress in many ways in her life and displays a growing serenity and peace about her. The treatment plan was reviewed, signed and completed accordingly. We will continue to follow this patient closely in the days ahead. Her sobriety date remains 6/22.         Micole Delehanty, LCAS

## 2015-06-30 ENCOUNTER — Encounter (HOSPITAL_COMMUNITY): Payer: Self-pay | Admitting: Psychology

## 2015-06-30 ENCOUNTER — Other Ambulatory Visit (HOSPITAL_COMMUNITY): Admitting: Psychology

## 2015-06-30 DIAGNOSIS — F1020 Alcohol dependence, uncomplicated: Secondary | ICD-10-CM | POA: Diagnosis not present

## 2015-06-30 NOTE — Progress Notes (Signed)
    Daily Group Progress Note  Program: CD-IOP   Group Time: 1-2:30 pm  Participation Level: Active  Behavioral Response: Appropriate and Sharing  Type of Therapy: Process Group  Topic: Process; the first part of group was spent in process. Members shared bout any issues or concerns that had presented themselves in early recovery. During the check-in, members identified the things they had done to support their recovery over the weekend. The medical director met with patients today during this session.  Group Time: 2:45- 4pm  Participation Level: Active  Behavioral Response: Appropriate  Type of Therapy: Psycho-education Group  Topic: Family Sculpture/Graduation: the second half of group was spent in a psycho-ed. This session was a continuation of the [facility] that was begun last week. The 3 remaining members that had yet to complete these were asked to 'sculpt' their families to reflect the nature of their family at some time during their childhoods. For one member, it was a very painful sculpture of her childhood while for another; a very happy family was depicted with lots of love, support and validation. This session ended with a graduation ceremony honoring a member who was leaving the program successfully. Kind words were shared with the graduating member and he expressed his appreciation for what he had learned from his fellow group members and he encouraged them to continue on their journeys as he intends to.    Summary: The patient reported she had attended 4 AA meetings since the last group session. When I asked about the wrist brace she was wearing on her left hand/wrist, the patient reported that on Friday morning she had fallen on her front steps taking her son's dogs out to the front yard. The bigger dog had pulled her down as she prepared to step down to the last step and she had landed on her hip shoulder and wrist. The patient admitted she had been in her  nightgown and it had been pretty upsetting. Her neighbor had been leaving for work, saw her sitting there and ran over to help. The patient noted that she is caring for the 2 dogs while her son and daughter-in-law are at the beach. Despite this slip, the patient reported she had a busy weekend, including traveling to her nephew's wedding in [place] with her in-laws. She had enjoyed the reception, but when asked about taking them to the ceremony, she reported they were very close and she had been glad to have been able to take them. The patient served as a family member in another member's family sculpture and provided good feedback during the discussion of the sculpture. She shared kind words with the graduating member and wished him well. The patient is doing everything we have asked of her and more. She responded well in this intervention and continues to make excellent progress in her recovery. The patient's sobriety date remains 6/22.   Family Program: Family present? No   Name of family member(s):   UDS collected: No Results:   AA/NA attended?: YesFriday, Saturday and Sunday  Sponsor?: No, but she is seeking a sponsor   Lindley Stachnik, LCAS

## 2015-07-01 ENCOUNTER — Encounter (HOSPITAL_COMMUNITY): Payer: Self-pay | Admitting: Psychology

## 2015-07-01 ENCOUNTER — Other Ambulatory Visit (HOSPITAL_COMMUNITY): Admitting: Psychology

## 2015-07-01 DIAGNOSIS — F1020 Alcohol dependence, uncomplicated: Secondary | ICD-10-CM | POA: Diagnosis not present

## 2015-07-01 NOTE — Progress Notes (Signed)
    Daily Group Progress Note  Program: CD-IOP   Group Time: 1-2:30 PM  Participation Level: Active  Behavioral Response: Appropriate and Sharing  Type of Therapy: Process Group  Topic: Process: the first part of group was spent in process. Members shared about the issues, concerns and challenges they are facing in early recovery. Included in their disclosures were the things they did since the last group session to support their recovery and contribute to a more balanced and healthy life style.   Group Time: 2:45- 4PM  Participation Level: Active  Behavioral Response: Sharing  Type of Therapy: Psycho-education Group  Topic: Body/Mind/Spirit: what do you do to feed your body, mind and spirit/Graduation. The second half of group was spent in a psycho-ed. A handout was provided and members were asked to rate how well they address and take care of their bodies, minds and spirits. The group discussed their rating and where they were weak or poor and what they might do to begin to improve these areas. At the conclusion of group today, a graduation ceremony was held to honor a member who was completing the program toady. After brownies, kind words were shared as the medallion was passed around and members shared their hopes with the member. The patient appeared very touched and grateful to her fellow group members for their support and kindness.   Summary: The patient reported she had attended 1 AA meeting since the last group session. She sighed and admitted she was looking forward to her son and daughter-in-law returning home tomorrow. She has been taking care of their 2 dogs and she was tired and ready for them to go home. On Friday morning around 6:30 she had walked them out the front door only to be pulled down as she walked down the steps. She had landed on her hip, shoulder and wrist, but only her left wrist had been injured. The patient provided good feedback to another group member  about addressing her emotional pain. She reminded the group that, "it gets better, but it will never be the same". In the psycho-ed, the patient reported she scored well in the body part of the handout, but expressed exasperation about her score in the mind section. I reminded the group that the characteristics listed were really challenging and it would be very difficult to meet them. The patient had words of support and validation for the graduating member and they agreed that they would continue to meet each other in AA meetings. The patient continues to make good progress in her early recovery and continues to meet all of the requirements of this program. Her sobriety date remains 6/22.   Family Program: Family present? No   Name of family member(s):   UDS collected: No Results:   AA/NA attended?: [place]  Sponsor?: No, but she is seeking one   Mattia Liford, LCAS

## 2015-07-02 ENCOUNTER — Other Ambulatory Visit (HOSPITAL_COMMUNITY)

## 2015-07-04 ENCOUNTER — Encounter (HOSPITAL_COMMUNITY): Payer: Self-pay | Admitting: Psychology

## 2015-07-04 NOTE — Progress Notes (Signed)
    Daily Group Progress Note  Program: CD-IOP   Group Time: 1-2:30 pm  Participation Level: Active  Behavioral Response: Sharing  Type of Therapy: Process Group  Topic: Process: the first half of group was spent in process. Members shared about any challenges or insights gleaned since our last group session. Included in this conversation were things that they had done to support ongoing sobriety.   Group Time: 2:45- 4pm  Participation Level: Active  Behavioral Response: Appropriate and Sharing  Type of Therapy: Psycho-education Group  Topic: Psycho-Ed: Guided Progressive Relaxation Exercise/ "Relapse Prevention; Saying No". The second half of group was presented in 2 parts. Members were led in a 15 minute guided progressive relaxation exercise. I read to them and led them through this exercise. Everyone stated they had found it very relaxing. They were encouraged to practice this exercise, in some capacity, on a daily basis. The second half of this session was spent in a psycho-ed on Relapse Prevention. A handout was provided and members read from it. A discussion ensued on the suggestions with members recalling scenarios where they were faced with these challenges and how they might respond today.   Summary: The patient reported she had gone to the AA meeting this morning. It had been a very good one and another female member had also attended and agreed with her. When asked about the new sleep medication she had been prescribed, the patient reported she had taken it for the first time last night and, "I slept well". During the guided meditation, the patient reported she had found herself feeling light-headed and she had put her head down near her knees until she felt better. She reminded me that she has low blood pressure. During the psycho-ed, the patient recounted a moment from last weekend when she drove her elderly in-laws to a wedding reception in [place]. She had been seated  at a table with family and had been asked whether she wanted wine with dinner. Kurt admitted they acted as if it was strange that I wasn't drinking. She had reminded them she was the designated driver for her in-laws, but people still seemed a little weird. She thought it was a bit odd as well. The patient is doing everything we have asked of her and she is making good progress in her recovery. She displays excellent insight into her daily recovery needs and remains abstinent with a sobriety date of 6/22.   Family Program: Family present? No   Name of family member(s):   UDS collected: No Results:   AA/NA attended?: YesThursday  Sponsor?: No   Norinne Jeane, LCAS

## 2015-07-05 ENCOUNTER — Other Ambulatory Visit (HOSPITAL_COMMUNITY): Admitting: Psychology

## 2015-07-05 DIAGNOSIS — F1020 Alcohol dependence, uncomplicated: Secondary | ICD-10-CM

## 2015-07-05 DIAGNOSIS — G4700 Insomnia, unspecified: Secondary | ICD-10-CM

## 2015-07-05 MED ORDER — MIRTAZAPINE 45 MG PO TBDP: 45.0000 mg | ORAL_TABLET | Freq: Every day | ORAL | Status: DC

## 2015-07-07 ENCOUNTER — Other Ambulatory Visit (HOSPITAL_COMMUNITY): Admitting: Psychology

## 2015-07-07 DIAGNOSIS — F4310 Post-traumatic stress disorder, unspecified: Secondary | ICD-10-CM

## 2015-07-07 DIAGNOSIS — F1020 Alcohol dependence, uncomplicated: Secondary | ICD-10-CM

## 2015-07-07 DIAGNOSIS — Z9884 Bariatric surgery status: Secondary | ICD-10-CM

## 2015-07-08 ENCOUNTER — Other Ambulatory Visit (HOSPITAL_COMMUNITY): Payer: Self-pay | Admitting: Medical

## 2015-07-08 ENCOUNTER — Encounter (HOSPITAL_COMMUNITY): Payer: Self-pay | Admitting: Psychology

## 2015-07-08 ENCOUNTER — Other Ambulatory Visit (HOSPITAL_COMMUNITY): Admitting: Licensed Clinical Social Worker

## 2015-07-08 DIAGNOSIS — F1020 Alcohol dependence, uncomplicated: Secondary | ICD-10-CM

## 2015-07-08 NOTE — Progress Notes (Signed)
    Daily Group Progress Note  Program: CD-IOP   Group Time: 1-2:30 pm  Participation Level: Active  Behavioral Response: Appropriate and Sharing  Type of Therapy: Process Group  Topic:Process: the first part of group was spent in process. Members shared about the challenges of early recovery over this past weekend. Members are asked to identify what they did to support their recovery. While one member attended 4 12-step meetings, other members attended none. A difficult housing situation was discussed with her fellow group members providing support, good feedback and, most importantly, patience. During group today, the Market researcher met with the newest group member. She had left in the middle of last Monday's session because of a migraine and was unable to meet with him a scheduled.    Group Time: 2:45-4pm  Participation Level: Active  Behavioral Response: Sharing  Type of Therapy: Psycho-education Group  Topic: Bio-Psycho-Social-Spiritual: the second half of group was spent in a psycho-ed on the 4 elements believed to contribute and lead to addiction. While everyone present in the group has the biological predisposition, the other 3 elements were quite different for each group member. Members discussed how each of these 4 elements had manifested in their lives. Of the 7 group members present today, only 1 member had not begun drinking/drugging in her early to mid-teens.   Summary: The patient reported a busy weekend spent in [place] with the basketball camp managed by her son-in-law. She admitted she had not attended any meetings. She reported, "We had fun". 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 She agreed with another member who commented about her engagement in her children's lives. The patient reported, "I have a need to be needed". In  the psycho-ed, the patient was the only group member who had not begun using in her teens. She reported she had not begun drinking until she was in her 40's. And she only began abusing it after her husband died. The patient reported she and her brother have racked their brains trying to identify where the addiction resides in their family history. They have not been able to identify where it had come from. The patient had some good comments and continues to make significant progress in early recovery. She responded well to this intervention and her sobriety date remains 6/22.    Family Program: Family present? No   Name of family member(s):   UDS collected: No Results:   AA/NA attended?: No  Sponsor?: No, but she is actively seeking a sponsor   Brystal Kildow, LCAS

## 2015-07-09 ENCOUNTER — Other Ambulatory Visit (HOSPITAL_COMMUNITY)

## 2015-07-09 ENCOUNTER — Encounter (HOSPITAL_COMMUNITY): Payer: Self-pay | Admitting: Psychology

## 2015-07-09 ENCOUNTER — Encounter (HOSPITAL_COMMUNITY): Payer: Self-pay | Admitting: Licensed Clinical Social Worker

## 2015-07-09 NOTE — Progress Notes (Signed)
    Daily Group Progress Note  Program: CD-IOP   Group Time: 1-2:30  Participation Level: Active  Behavioral Response: Appropriate and Sharing  Type of Therapy: Process Group  Topic: After checking in with sobriety dates, group members took turns sharing about recovery-related activities and challenges they faced since the last group. They also shared any urges or cravings they may have experienced.  Group members were openly engaged in suggestions and feedback. Drug tests were handed out to group members. The second half of group focused on the neurobiology of addiction. A dvd was presented on this topic which explained in detail how deficits in frontal cortex and low dopamine levels may define how addiction occurs and why relapse may be occurring in early recovery. All group members participated in the discussion throughout the dvd and asked appropriate questions.     Group Time: 2:45-4  Participation Level: Active  Behavioral Response: Appropriate and Sharing  Type of Therapy: Psycho-education Group  Topic: he second half of group focused on the neurobiology of addiction. A dvd was presented on this topic which explained in detail how deficits in frontal cortex and low dopamine levels may define how addiction occurs and why relapse may be occurring in early recovery. All group members participated in the discussion throughout the dvd and asked appropriate questions.      Summary: :  Patient reported she slept in this morning and did not make her normal morning meeting. Patient tearfully shared how difficult this week has been for her and her children. Her husband had a brain aneurysm 3 years ago. This week is the anniversary of the decision to take him off of life support. Her children are all dealing with the loss differently, as she is. She received lots of positive feedback from other group members. Patient was attentive during the intervention. She shared her interpretation  about the effects of alcoholism on the brain. She said she got a lot out of the dvd. Patient shared her weekend plans with the group which will be full of fond memories of her husband. Her sobriety date is 6/22.   Family Program: Family present? Yes   Name of family member(s):   UDS collected: Yes Results: Pending  AA/NA attended?: No  Sponsor?: No   Harden Bramer S, Licensed Cli

## 2015-07-09 NOTE — Progress Notes (Signed)
    Daily Group Progress Note  Program: CD-IOP   Group Time: 1-2:30 pm  Participation Level: Active  Behavioral Response: Appropriate and Sharing  Type of Therapy: Psycho-education Group  Topic: Psycho-Ed: the first part of group was spent with a guest from the chaplaincy department. He was introduced and group members introduced themselves to our guest. He shared about having come here to speak with groups in the past and how, as a recovering addict, he enjoyed being with this group. The guest went on to explain about feelings and how "everything you feel is going to come out. One will either act it out or tell it". The problem is that we often 'act them out' and acting them out is usually confusing. He suggested that by verbalizing our feelings we avoid the 'explosions' that prove so painful and damaging in our lives and relationships. The exercise he led included passing around a small polished rock and saying to the person next to you, "Sometimes I pretend to.". It proved to be an extremely powerful session.  Group Time: 2:45- 4pm  Participation Level: Active  Behavioral Response: Appropriate  Type of Therapy: Process Group  Topic: Process: the second half of group was spent in process. Members shared about their experience with the chaplain and what they discovered and felt during the session. One member had actually been too frightened to share anything. The importance of disclosure, of opening up what one has kept in, is the therapeutic process that begins to 'free' one from the chains of addiction. It will be essential to open up to allow the recovery process to begin.    Summary: The patient was engaged in the session with the chaplain. When her turn to hold the stone came, she looked at the group member next to her and stated, "I am angry that my husband is gone and that we cannot live out the days of our lives that we had so hoped to. I am very angry about that". This  disclosure was made with tears in her eyes. In process, the patient noted that this week represents the 3rd anniversary of her husband's sudden illness and subsequent death from a brain aneurysm. It is a week filled with emotion. She reported she had attended 3 AA meetings, including the one with another member. It had been a good group, but she had never attended that type of meditation group. The patient responded well to this intervention and proved herself willing to be vulnerable and open with her fellow group members. Her sobriety date remains 6/22.    Family Program: Family present? No   Name of family member(s):   UDS collected: No Results:   AA/NA attended?: [place]  Sponsor?: No   Mayda Shippee, LCAS

## 2015-07-12 ENCOUNTER — Other Ambulatory Visit (HOSPITAL_COMMUNITY): Admitting: Psychology

## 2015-07-12 DIAGNOSIS — F4310 Post-traumatic stress disorder, unspecified: Secondary | ICD-10-CM

## 2015-07-12 DIAGNOSIS — F1020 Alcohol dependence, uncomplicated: Secondary | ICD-10-CM

## 2015-07-12 DIAGNOSIS — Z9884 Bariatric surgery status: Secondary | ICD-10-CM

## 2015-07-13 LAB — TOXASSURE SELECT 13 (MW), URINE: PDF: 0

## 2015-07-14 ENCOUNTER — Other Ambulatory Visit (HOSPITAL_COMMUNITY): Admitting: Psychology

## 2015-07-14 ENCOUNTER — Encounter (HOSPITAL_COMMUNITY): Payer: Self-pay | Admitting: Psychology

## 2015-07-14 ENCOUNTER — Other Ambulatory Visit (HOSPITAL_COMMUNITY): Payer: Self-pay | Admitting: Medical

## 2015-07-14 DIAGNOSIS — F4310 Post-traumatic stress disorder, unspecified: Secondary | ICD-10-CM

## 2015-07-14 DIAGNOSIS — G4700 Insomnia, unspecified: Secondary | ICD-10-CM

## 2015-07-14 DIAGNOSIS — F1020 Alcohol dependence, uncomplicated: Secondary | ICD-10-CM

## 2015-07-14 DIAGNOSIS — Z9884 Bariatric surgery status: Secondary | ICD-10-CM

## 2015-07-14 MED ORDER — RAMELTEON 8 MG PO TABS: 8.0000 mg | ORAL_TABLET | Freq: Every day | ORAL | Status: DC

## 2015-07-14 NOTE — Progress Notes (Signed)
    Daily Group Progress Note  Program: CD-IOP   Group Time: 1-2:30 pm  Participation Level: Active  Behavioral Response: Appropriate and Sharing  Type of Therapy: Psycho-education Group  Topic: Psycho-Ed:  "Six Simple Words". A presentation was made by a guest speaker. The premise of this session was using just six words to 'tell your story'. There were 3 parts to this psycho-ed. they included: the individual writing 6 words to describe themselves, writing six words to describe our future and, finally, what words would someone that knows you use to describe you.  There was good disclosure among group members and a lively discussion ensued. Also present today was a new group member who introduced herself. Two students enrolled at [facility] in the [place] program were also present and they participated in the discussion as well.   Group Time: 2:45- 4pm  Participation Level: Active  Behavioral Response: Appropriate  Type of Therapy: Process Group  Topic: Process: the second half of group was spent in a follow up to the earlier presentation and then process. Members shared about what they experienced about using these words to describe themselves, or "their stories". They were invited to translate these descriptions into values. The group also completed their check-ins, describing what they had done since we last met to remain drug-free and strengthen their recoveries. During group today, the Investment banker, operational met with the new group member and another member who will be completing the program later this week.   Summary: The patient shared openly about her "life story:. She identified herself as: daughter, sister, wife, mother, widow, grandmother. She noted that this was the order in which she became each of these identities. In the next part, she identified the words that someone else might use and they included: faithful, loving, broken, and human. Her disclosures were very poignant and they  all seemed framed by her husband's sudden death 3 years ago this past 02-26-2023. In process, the patient reported she had attended 3 AA meetings since the last session. Her daughter had organized a dinner on 02/26/2023 evening at [employer] and the patient reported that it was really very nice. They had met to honor the 3rd anniversary of his passing from a brain aneurysm. The patient reported it had been different and more peaceful. She explained that she feels as if she has moved into a different place now. One of acceptance and, with that, peace. The patient provided good feedback to her fellow group members and had attended an evening AA meeting because she knew another group member was going to pick up her 30 day chip. The patiet continues to make good Progress and this intervention proved very therapeutic for her. Her sobriety date remains  6/22.  Family Program: Family present? No   Name of family member(s):   UDS collected: No Results:   AA/NA attended?: YesThursday, Friday and Sunday  Sponsor?: No, but she is pursuing some good leads   Aviel Davalos, LCAS

## 2015-07-15 ENCOUNTER — Other Ambulatory Visit (HOSPITAL_COMMUNITY): Admitting: Licensed Clinical Social Worker

## 2015-07-15 DIAGNOSIS — F1020 Alcohol dependence, uncomplicated: Secondary | ICD-10-CM

## 2015-07-16 ENCOUNTER — Other Ambulatory Visit (HOSPITAL_COMMUNITY)

## 2015-07-16 ENCOUNTER — Telehealth (HOSPITAL_COMMUNITY): Payer: Self-pay | Admitting: Psychology

## 2015-07-16 ENCOUNTER — Encounter (HOSPITAL_COMMUNITY): Payer: Self-pay | Admitting: Psychology

## 2015-07-16 NOTE — Progress Notes (Signed)
    Daily Group Progress Note  Program: CD-IOP   Group Time: 1-2:30 pm  Participation Level: Active  Behavioral Response: Sharing  Type of Therapy: Process Group  Topic: Process: The first part of group was spent in process. Members shared about current challenges or struggles they are experiencing in early recovery. Any cravings or obsessive thoughts about using are shared as well as successes in remaining sober and not falling for old triggers or cues. Drug test results were returned and all 4 of the tests were negative on all counts.   Group Time: 2:45- 4pm  Participation Level: Active  Behavioral Response: Appropriate and Sharing  Type of Therapy: Psycho-education Group  Topic: Psycho-Ed: "The Spiritual Principles behind the 12-Steps". A handout on the 12-Steps was provided to group members. It included identifying at length, the spiritual principle behind each of the 12-Steps. The spiritual principle behind Step 1 is honesty. A discussion ensued about the importance of honesty in recovery. Members were asked to identify when they learned to lie. Almost everyone admitted that they learned to lie in their childhoods when they lied to get what they want or avoid negative consequences. As they grew older, the lies were to avoid 'hurting others'. Members shared about their lies in active addiction and the impact these lies have had on their loved ones as well as themselves.    Summary: The patient reported she had attended 1 meeting since the last group session. She had gone to the meeting where another member had picked up her 30 day chip. She reported that she is having "Daddy Drama" and explained that she manages her father's financial situation and it can get very frustrating because he suffers from dementia and gets very confused and frustrated with her. "He is like a 61 year old and I just can't reason with him", she said. Another member encouraged her not to argue with him because  there is no winning that argument. The patient agreed with this observation. When asked about a sponsor, the patient reported she had spoken with a woman about a recommendation she might have for her, but the woman has now gone in for hip replacement and won't be back in the rooms for awhile. When asked how she had slept, the patient reported she slept a little better last night, but she has been struggling with difficulty getting to sleep for a while now. In the psycho-ed, the patient reported that her biggest problem had been the "lie to myself". She explained that as her drinking increased, she was aware, in the back of her mind, that this might be a concern, but she discounted it and refused to acknowledge any problems. The alcohol worked to numb her pain and that was what really mattered. The patient is very open about her alcohol use and the problems that developed prior to her family confronting her about her drinking and sending her to detox. She has excellent insight and appears fully committed to an abstinence-based lifestyle going forward. Her sobriety date remains 6/22.    Family Program: Family present? No   Name of family member(s):   UDS collected: No Results:   AA/NA attended?: [place]  Sponsor?: No, but she has sought one out, but the woman had too many sponsees. She continues to seek a temporary sponsor.   Illeana Edick, LCAS

## 2015-07-19 ENCOUNTER — Other Ambulatory Visit (HOSPITAL_COMMUNITY): Admitting: Psychology

## 2015-07-19 ENCOUNTER — Encounter (HOSPITAL_COMMUNITY): Payer: Self-pay | Admitting: Licensed Clinical Social Worker

## 2015-07-19 ENCOUNTER — Encounter (HOSPITAL_COMMUNITY): Payer: Self-pay | Admitting: Medical

## 2015-07-19 DIAGNOSIS — F1020 Alcohol dependence, uncomplicated: Secondary | ICD-10-CM

## 2015-07-19 DIAGNOSIS — F4310 Post-traumatic stress disorder, unspecified: Secondary | ICD-10-CM

## 2015-07-19 DIAGNOSIS — T7432XS Child psychological abuse, confirmed, sequela: Secondary | ICD-10-CM

## 2015-07-19 DIAGNOSIS — G4700 Insomnia, unspecified: Secondary | ICD-10-CM

## 2015-07-19 DIAGNOSIS — G894 Chronic pain syndrome: Secondary | ICD-10-CM

## 2015-07-19 MED ORDER — QUETIAPINE FUMARATE 25 MG PO TABS: ORAL_TABLET | ORAL | Status: DC

## 2015-07-19 NOTE — Progress Notes (Signed)
  [facility] Chemical Dependency Intensive Outpatient Discharge Summary   Jasmine Richard [id number]  Date of Admission: 05/26/2015 Date of Discharge: 07/19/2015  Course of Treatment: Pt has completed CD IOP successfully as of today. She has bee able to maintain abstinence thru treatment and Alcoholics Anonymous..She has had significant problems with insomnia which are chronic and she is not a candiste for addictive sleep aids including Ambien and the like as they stimulate the alcohol receptors in the brain much as alcohol does. This is not a conscious process. She will return to Dr Pete Glatter for on going Primary Care and medication management as she is most comfortable with this. Goals and Activities to Help Maintain Sobriety: 1. Stay away from old friends who continue to drink and use mind-altering chemicals and are unable or unwilling to respect your recovery 2. Continue practicing Fair Fighting rules in interpersonal conflicts. 3. Continue alcohol and drug refusal skills and call on support systems. 4. Consider requesting Sleep Study for chronic insomnia  Referrals: NA   Aftercare services: Wednesday 5:30 Aftercare at [facility] with Jasmine Mesi LCSW 1. Attend AA/ meetings as in treatment. 2. Obtain a sponsor and continue with  home group in AA (10 am [employer]) 3. Return to Dr Pete Glatter  Next appointment: as scheduled with Dr Pete Glatter  Plan of Action to Address Continuing Problems: As above    Client has participated in the development of this discharge plan and has received a copy of this completed plan  Maryjean Morn  07/19/2015   Maryjean Morn, PA-C 07/19/2015

## 2015-07-19 NOTE — Progress Notes (Signed)
    Daily Group Progress Note  Program: CD-IOP   Group Time: 1-2:30  Participation Level: Active  Behavioral Response: Appropriate and Sharing  Type of Therapy: Process Group  Topic: After check in, group members shared recovery-related activities since the last group. Group members shared any urges, thoughts or cravings they may have experienced.  One group member graduated from the program today and his wife attended the graduation. Group members gave accolades to the member graduating.    Group Time: 2:45-4  Participation Level: Active  Behavioral Response: Appropriate and Sharing  Type of Therapy: Psycho-education Group  Topic: The second part of group focused on recovery and present-moment living. Group members read today's daily meditation, "Valuing this Moment" from the book Daily Meditations Co-dependent No More, Melody Beattie. Also, a past member of CD-IOP returned to tell his story of recovery. He wove his story of strength and hope through the daily meditation while group members discussed their personal story of living in the moment.       Summary: Patient reported she went to her normal meeting this morning. Yesterday she spent some quality time with her middle son. She is looking forward to completing the program next week. Patient read part of the meditation and wants to learn to live in the moment. Patient reported she recognized the ex-group member and met him at her first [place] meeting. She has done well in the program and is building her program of recovery. She benefited from the intervention. Her sobriety date is 6/22.   Family Program: Family present? No   Name of family member(s):   UDS collected: No Results:   AA/NA attended?: YesWednesday  Sponsor?: No   Pauline Pegues S, Licensed Cli

## 2015-07-21 ENCOUNTER — Telehealth (HOSPITAL_COMMUNITY): Payer: Self-pay

## 2015-07-21 ENCOUNTER — Other Ambulatory Visit (HOSPITAL_COMMUNITY)

## 2015-07-21 ENCOUNTER — Other Ambulatory Visit (HOSPITAL_COMMUNITY): Payer: Self-pay | Admitting: Medical
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
## 2015-07-21 NOTE — Telephone Encounter (Signed)
Met with Darlyne Russian, PA-C to show fax from [facility] requesting verification of order he had sent for patient's Quetiapine 25mg  tablet as stated "Take as directed for sleep".  Darlyne Russian, [place] reported this was 1-4 qhs for sleep and called and spoke with Rise Paganini at [facility] to clarify this is was he wanted on directions for the order.

## 2015-07-21 NOTE — Telephone Encounter (Signed)
AS DISCUSSED

## 2015-07-23 ENCOUNTER — Other Ambulatory Visit (HOSPITAL_COMMUNITY)

## 2015-07-26 ENCOUNTER — Other Ambulatory Visit (HOSPITAL_COMMUNITY)

## 2015-07-28 ENCOUNTER — Other Ambulatory Visit (HOSPITAL_COMMUNITY)

## 2015-07-30 ENCOUNTER — Other Ambulatory Visit (HOSPITAL_COMMUNITY)

## 2015-08-04 ENCOUNTER — Other Ambulatory Visit (HOSPITAL_COMMUNITY)

## 2015-08-06 ENCOUNTER — Other Ambulatory Visit (HOSPITAL_COMMUNITY)

## 2015-08-09 ENCOUNTER — Other Ambulatory Visit (HOSPITAL_COMMUNITY)

## 2015-08-11 ENCOUNTER — Other Ambulatory Visit (HOSPITAL_COMMUNITY)

## 2015-08-13 ENCOUNTER — Other Ambulatory Visit (HOSPITAL_COMMUNITY)

## 2015-08-16 ENCOUNTER — Other Ambulatory Visit (HOSPITAL_COMMUNITY)

## 2015-08-18 ENCOUNTER — Other Ambulatory Visit (HOSPITAL_COMMUNITY)

## 2015-08-20 ENCOUNTER — Encounter: Payer: Self-pay | Admitting: Gastroenterology

## 2015-08-20 ENCOUNTER — Other Ambulatory Visit (HOSPITAL_COMMUNITY)

## 2015-08-23 ENCOUNTER — Other Ambulatory Visit (HOSPITAL_COMMUNITY)

## 2015-08-24 ENCOUNTER — Ambulatory Visit (INDEPENDENT_AMBULATORY_CARE_PROVIDER_SITE_OTHER): Admitting: Psychiatry

## 2015-08-24 ENCOUNTER — Other Ambulatory Visit: Payer: Self-pay | Admitting: Geriatric Medicine

## 2015-08-24 DIAGNOSIS — F4323 Adjustment disorder with mixed anxiety and depressed mood: Secondary | ICD-10-CM

## 2015-08-24 DIAGNOSIS — F1020 Alcohol dependence, uncomplicated: Secondary | ICD-10-CM

## 2015-08-24 DIAGNOSIS — K759 Inflammatory liver disease, unspecified: Secondary | ICD-10-CM

## 2015-08-25 ENCOUNTER — Other Ambulatory Visit (HOSPITAL_COMMUNITY)

## 2015-08-27 ENCOUNTER — Ambulatory Visit
Admission: RE | Admit: 2015-08-27 | Discharge: 2015-08-27 | Disposition: A | Discharge status: Home / Self Care | Source: Ambulatory Visit | Attending: Geriatric Medicine | Admitting: Geriatric Medicine

## 2015-08-27 DIAGNOSIS — K759 Inflammatory liver disease, unspecified: Secondary | ICD-10-CM

## 2015-09-07 ENCOUNTER — Ambulatory Visit (INDEPENDENT_AMBULATORY_CARE_PROVIDER_SITE_OTHER): Admitting: Psychiatry

## 2015-09-07 DIAGNOSIS — F4323 Adjustment disorder with mixed anxiety and depressed mood: Secondary | ICD-10-CM

## 2015-09-07 DIAGNOSIS — F1020 Alcohol dependence, uncomplicated: Secondary | ICD-10-CM | POA: Diagnosis not present

## 2015-09-14 ENCOUNTER — Ambulatory Visit: Admitting: Psychiatry

## 2015-09-21 ENCOUNTER — Ambulatory Visit: Admitting: Psychiatry

## 2015-09-28 ENCOUNTER — Ambulatory Visit: Admitting: Psychiatry

## 2015-10-09 IMAGING — US US ABDOMEN LIMITED
1 series · 14 of 25 positions shown · non-contrast
Comparison: Renal ultrasound 04/11/2011 at [REDACTED]

CLINICAL DATA: Elevated liver function tests, hepatitis. Prior
cholecystectomy.

EXAM:
US ABDOMEN LIMITED - RIGHT UPPER QUADRANT

[Series 1: us abdomen limited · 0.26mm/px · 14 of 34 slices shown]
[im 1/34]
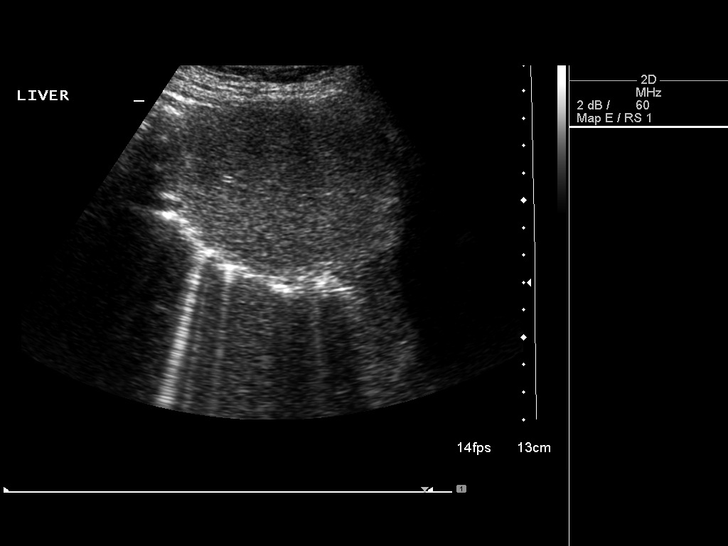
[im 3/34]
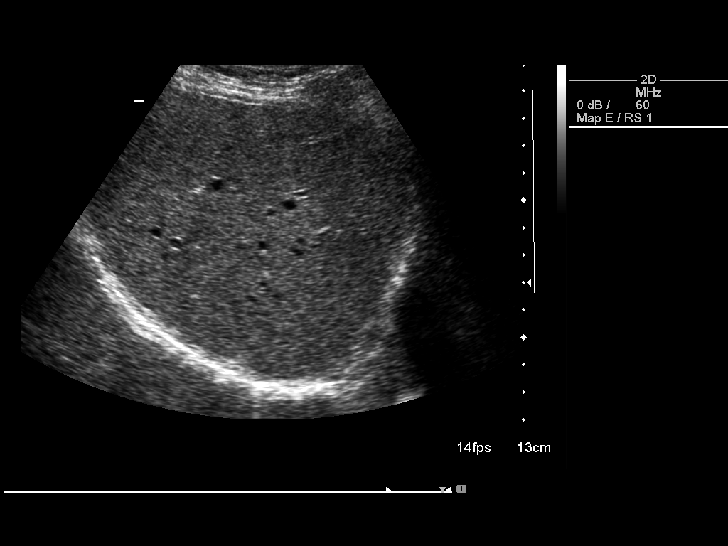
[im 6/34]
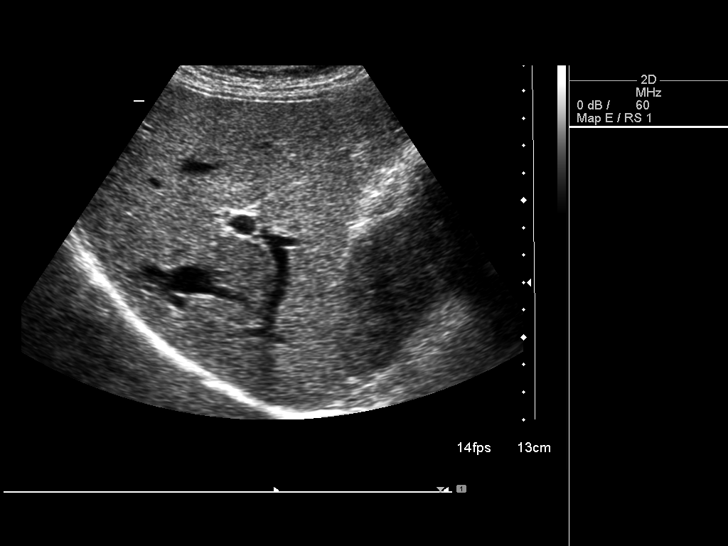
[im 9/34]
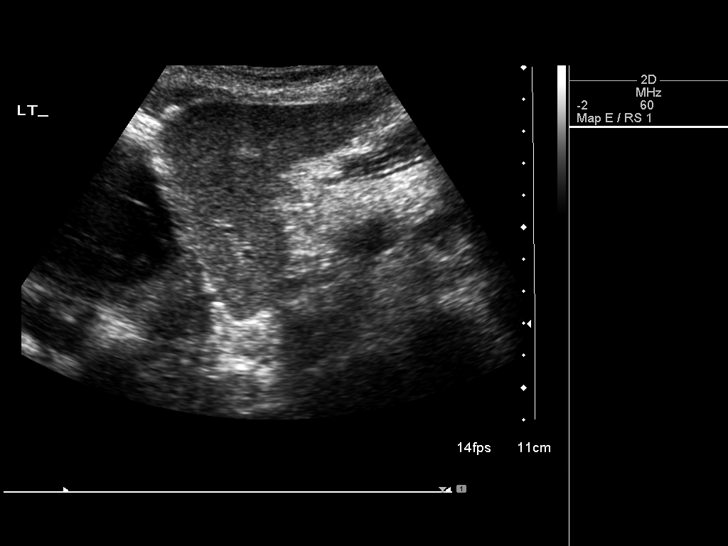
[im 12/34]
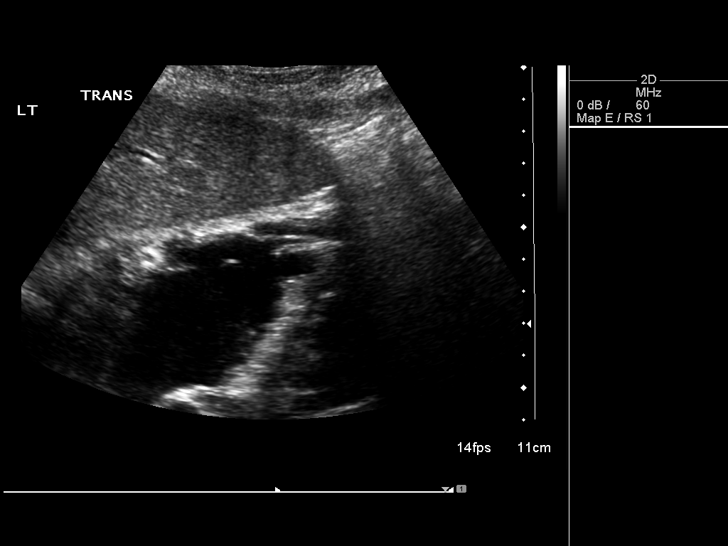
[im 13/34]
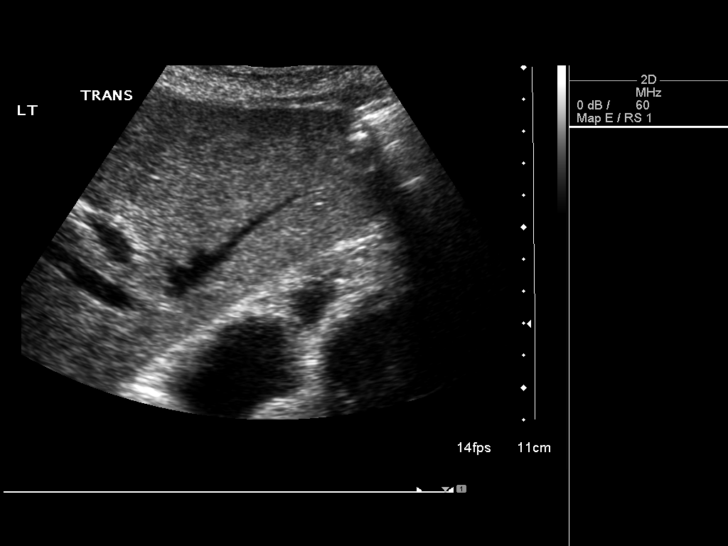
[im 16/34]
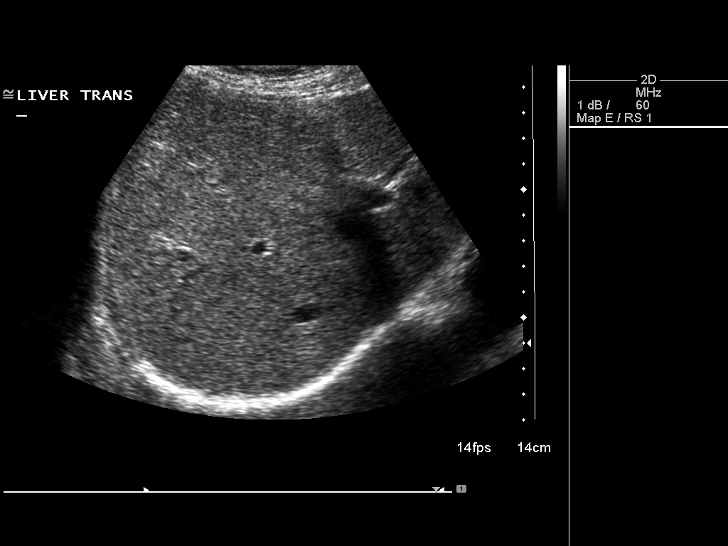
[im 18/34]
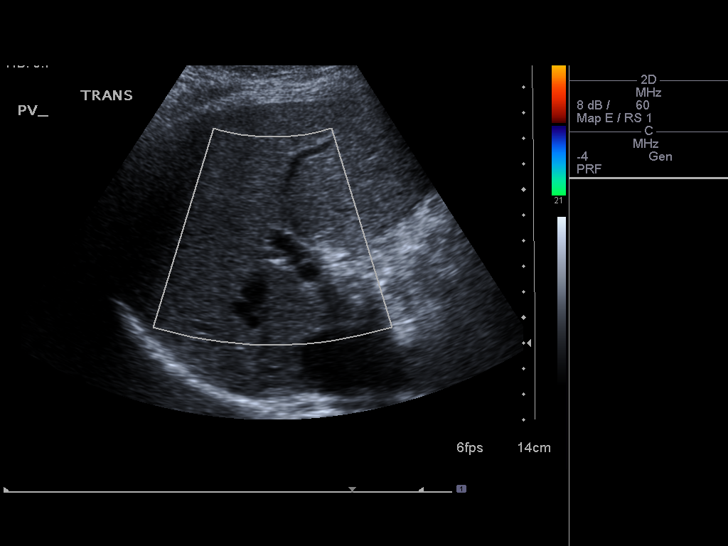
[im 21/34]
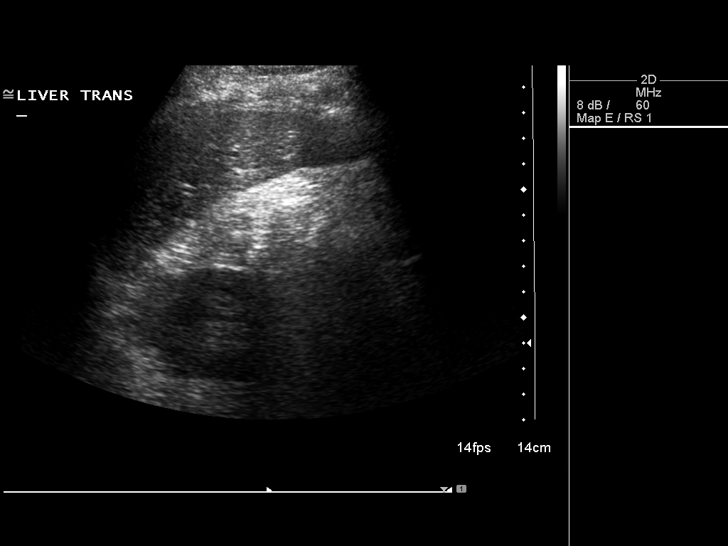
[im 23/34]
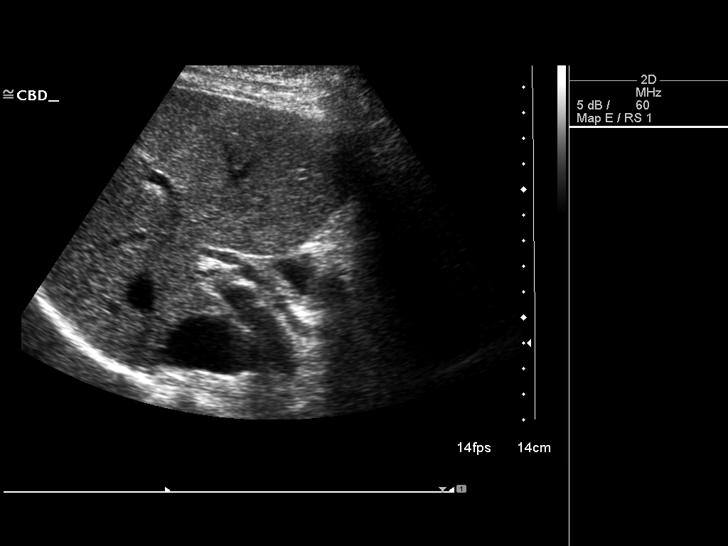
[im 25/34]
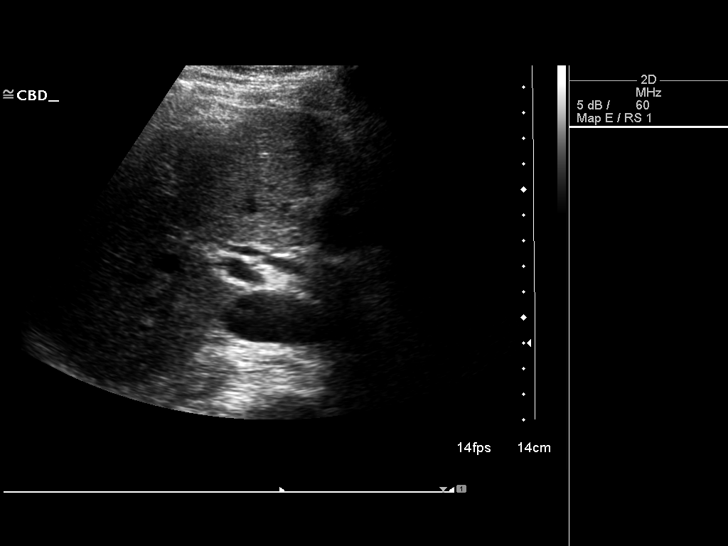
[im 28/34]
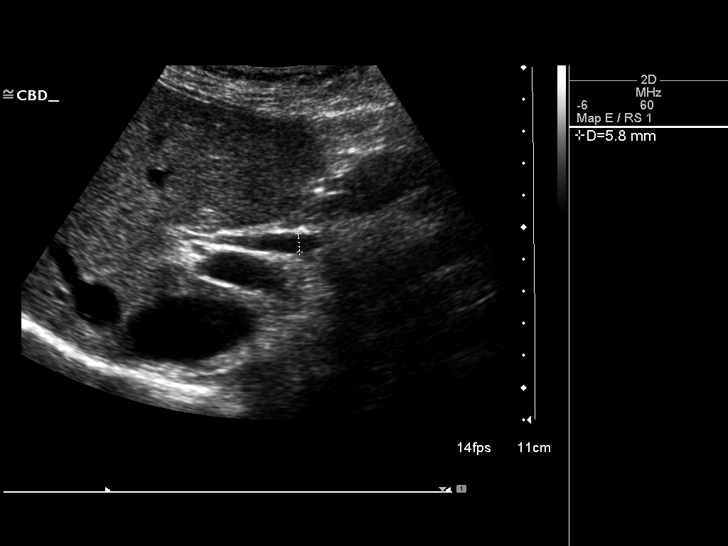
[im 31/34]
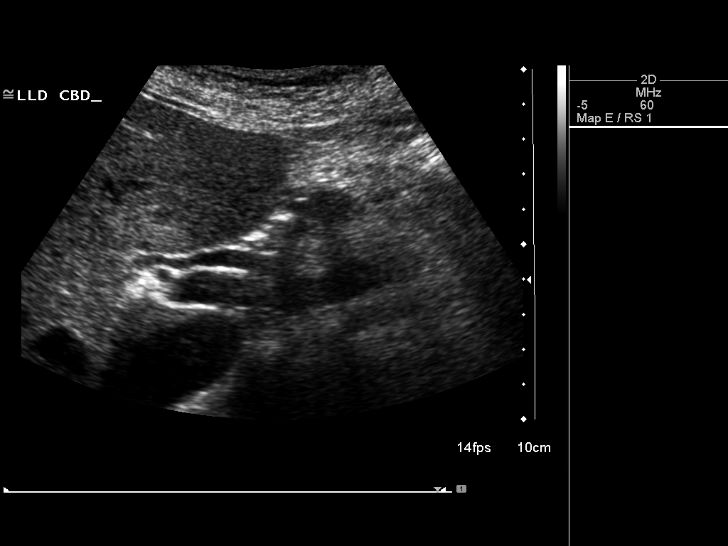
[im 34/34]
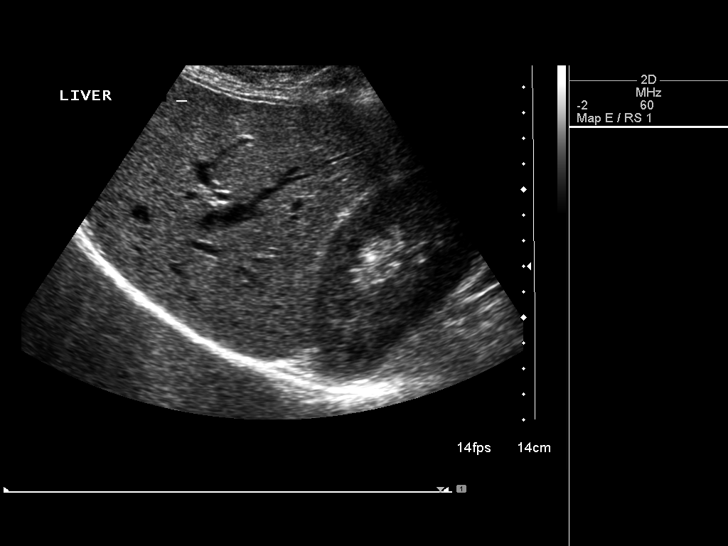

[14 of 25 positions shown; findings below may reference images not displayed]

FINDINGS: Gallbladder:

Surgically absent

Common bile duct:

Diameter: 6 mm

Liver:

No focal lesion identified. Within normal limits in parenchymal
echogenicity.
IMPRESSION: Normal exam post cholecystectomy.

## 2015-10-12 ENCOUNTER — Ambulatory Visit: Payer: Self-pay | Admitting: Gastroenterology

## 2015-11-08 ENCOUNTER — Ambulatory Visit: Admitting: Nurse Practitioner

## 2015-11-19 ENCOUNTER — Inpatient Hospital Stay (HOSPITAL_COMMUNITY)
Admission: EM | Admit: 2015-11-19 | Discharge: 2015-11-24 | DRG: 481 | Disposition: A | Discharge status: Skilled Nursing Facility | Attending: Orthopaedic Surgery | Admitting: Orthopaedic Surgery

## 2015-11-19 ENCOUNTER — Encounter (HOSPITAL_COMMUNITY)
Admission: EM | Disposition: A | Discharge status: Skilled Nursing Facility | Payer: Self-pay | Source: Home / Self Care | Attending: Orthopaedic Surgery

## 2015-11-19 ENCOUNTER — Inpatient Hospital Stay (HOSPITAL_COMMUNITY): Admitting: Anesthesiology

## 2015-11-19 ENCOUNTER — Emergency Department (HOSPITAL_COMMUNITY)

## 2015-11-19 ENCOUNTER — Encounter (HOSPITAL_COMMUNITY): Payer: Self-pay | Admitting: Emergency Medicine

## 2015-11-19 ENCOUNTER — Inpatient Hospital Stay (HOSPITAL_COMMUNITY)

## 2015-11-19 DIAGNOSIS — S72402A Unspecified fracture of lower end of left femur, initial encounter for closed fracture: Secondary | ICD-10-CM | POA: Diagnosis present

## 2015-11-19 DIAGNOSIS — S72462A Displaced supracondylar fracture with intracondylar extension of lower end of left femur, initial encounter for closed fracture: Principal | ICD-10-CM | POA: Diagnosis present

## 2015-11-19 DIAGNOSIS — W07XXXA Fall from chair, initial encounter: Secondary | ICD-10-CM | POA: Diagnosis present

## 2015-11-19 DIAGNOSIS — D62 Acute posthemorrhagic anemia: Secondary | ICD-10-CM | POA: Diagnosis not present

## 2015-11-19 DIAGNOSIS — M1990 Unspecified osteoarthritis, unspecified site: Secondary | ICD-10-CM | POA: Diagnosis not present

## 2015-11-19 DIAGNOSIS — L4050 Arthropathic psoriasis, unspecified: Secondary | ICD-10-CM | POA: Diagnosis not present

## 2015-11-19 DIAGNOSIS — S72409A Unspecified fracture of lower end of unspecified femur, initial encounter for closed fracture: Secondary | ICD-10-CM

## 2015-11-19 DIAGNOSIS — M25562 Pain in left knee: Secondary | ICD-10-CM | POA: Diagnosis present

## 2015-11-19 DIAGNOSIS — Z9889 Other specified postprocedural states: Secondary | ICD-10-CM

## 2015-11-19 DIAGNOSIS — Z791 Long term (current) use of non-steroidal anti-inflammatories (NSAID): Secondary | ICD-10-CM | POA: Diagnosis not present

## 2015-11-19 DIAGNOSIS — K219 Gastro-esophageal reflux disease without esophagitis: Secondary | ICD-10-CM | POA: Diagnosis not present

## 2015-11-19 DIAGNOSIS — S72452A Displaced supracondylar fracture without intracondylar extension of lower end of left femur, initial encounter for closed fracture: Secondary | ICD-10-CM | POA: Diagnosis not present

## 2015-11-19 DIAGNOSIS — Z8781 Personal history of (healed) traumatic fracture: Secondary | ICD-10-CM

## 2015-11-19 DIAGNOSIS — T148XXA Other injury of unspecified body region, initial encounter: Secondary | ICD-10-CM

## 2015-11-19 HISTORY — PX: FEMUR CLOSED REDUCTION: SHX939

## 2015-11-19 LAB — CBC WITH DIFFERENTIAL/PLATELET
BASOS ABS: 0 10*3/uL (ref 0.0–0.1)
BASOS PCT: 0 %
EOS ABS: 0.1 10*3/uL (ref 0.0–0.7)
EOS PCT: 1 %
HCT: 37.3 % (ref 36.0–46.0)
Hemoglobin: 12.7 g/dL (ref 12.0–15.0)
Lymphocytes Relative: 18 %
Lymphs Abs: 1.3 10*3/uL (ref 0.7–4.0)
MCH: 34.2 pg — ABNORMAL HIGH (ref 26.0–34.0)
MCHC: 34 g/dL (ref 30.0–36.0)
MCV: 100.5 fL — ABNORMAL HIGH (ref 78.0–100.0)
MONO ABS: 0.6 10*3/uL (ref 0.1–1.0)
Monocytes Relative: 8 %
Neutro Abs: 5.2 10*3/uL (ref 1.7–7.7)
Neutrophils Relative %: 73 %
PLATELETS: 229 10*3/uL (ref 150–400)
RBC: 3.71 MIL/uL — ABNORMAL LOW (ref 3.87–5.11)
RDW: 13.5 % (ref 11.5–15.5)
WBC: 7.1 10*3/uL (ref 4.0–10.5)

## 2015-11-19 LAB — I-STAT CHEM 8, ED
BUN: 14 mg/dL (ref 6–20)
CALCIUM ION: 1.17 mmol/L (ref 1.13–1.30)
CREATININE: 0.5 mg/dL (ref 0.44–1.00)
Chloride: 103 mmol/L (ref 101–111)
Glucose, Bld: 115 mg/dL — ABNORMAL HIGH (ref 65–99)
HEMATOCRIT: 40 % (ref 36.0–46.0)
Hemoglobin: 13.6 g/dL (ref 12.0–15.0)
POTASSIUM: 4.5 mmol/L (ref 3.5–5.1)
Sodium: 142 mmol/L (ref 135–145)
TCO2: 27 mmol/L (ref 0–100)

## 2015-11-19 SURGERY — CLOSED REDUCTION, FRACTURE, FEMUR, SHAFT
Anesthesia: General | Site: Leg Upper | Laterality: Left

## 2015-11-19 MED ORDER — LIDOCAINE HCL (CARDIAC) 20 MG/ML IV SOLN
INTRAVENOUS | Status: AC
  Filled 2015-11-19: qty 5

## 2015-11-19 MED ORDER — ONDANSETRON 4 MG PO TBDP
4.0000 mg | ORAL_TABLET | Freq: Four times a day (QID) | ORAL | Status: DC | PRN
  Filled 2015-11-19: qty 1

## 2015-11-19 MED ORDER — MIDAZOLAM HCL 5 MG/5ML IJ SOLN
INTRAMUSCULAR | Status: DC | PRN
  Administered 2015-11-19: 2 mg via INTRAVENOUS

## 2015-11-19 MED ORDER — HYDROMORPHONE HCL 1 MG/ML IJ SOLN
1.0000 mg | INTRAMUSCULAR | Status: DC | PRN
  Administered 2015-11-19 – 2015-11-21 (×5): 1 mg via INTRAVENOUS
  Filled 2015-11-19 (×3): qty 1

## 2015-11-19 MED ORDER — FENTANYL CITRATE (PF) 100 MCG/2ML IJ SOLN
INTRAMUSCULAR | Status: DC | PRN
  Administered 2015-11-19: 100 ug via INTRAVENOUS

## 2015-11-19 MED ORDER — SUMATRIPTAN SUCCINATE 100 MG PO TABS
100.0000 mg | ORAL_TABLET | ORAL | Status: DC | PRN
  Filled 2015-11-19: qty 1

## 2015-11-19 MED ORDER — PROPOFOL 10 MG/ML IV BOLUS
INTRAVENOUS | Status: AC
  Filled 2015-11-19: qty 40

## 2015-11-19 MED ORDER — PROPOFOL 10 MG/ML IV BOLUS
INTRAVENOUS | Status: DC | PRN
  Administered 2015-11-19: 150 mg via INTRAVENOUS

## 2015-11-19 MED ORDER — LACTATED RINGERS IV SOLN
INTRAVENOUS | Status: DC | PRN
  Administered 2015-11-19: 23:00:00 via INTRAVENOUS

## 2015-11-19 MED ORDER — POLYETHYLENE GLYCOL 3350 17 G PO PACK
17.0000 g | PACK | Freq: Every day | ORAL | Status: DC | PRN
  Filled 2015-11-19: qty 1

## 2015-11-19 MED ORDER — MELOXICAM 7.5 MG PO TABS
15.0000 mg | ORAL_TABLET | Freq: Every day | ORAL | Status: DC
  Filled 2015-11-19: qty 1

## 2015-11-19 MED ORDER — HYDROMORPHONE HCL 1 MG/ML IJ SOLN
0.2500 mg | INTRAMUSCULAR | Status: DC | PRN
  Administered 2015-11-20 (×4): 0.5 mg via INTRAVENOUS

## 2015-11-19 MED ORDER — ONDANSETRON HCL 4 MG/2ML IJ SOLN
INTRAMUSCULAR | Status: AC
  Filled 2015-11-19: qty 2

## 2015-11-19 MED ORDER — MIDAZOLAM HCL 2 MG/2ML IJ SOLN
INTRAMUSCULAR | Status: AC
  Filled 2015-11-19: qty 2

## 2015-11-19 MED ORDER — LORATADINE 10 MG PO TABS
10.0000 mg | ORAL_TABLET | Freq: Every day | ORAL | Status: DC
  Administered 2015-11-22 – 2015-11-24 (×3): 10 mg via ORAL
  Filled 2015-11-19 (×5): qty 1

## 2015-11-19 MED ORDER — FOLIC ACID 1 MG PO TABS
2.0000 mg | ORAL_TABLET | Freq: Two times a day (BID) | ORAL | Status: DC
  Administered 2015-11-21 – 2015-11-24 (×6): 2 mg via ORAL
  Filled 2015-11-19 (×9): qty 2

## 2015-11-19 MED ORDER — PANTOPRAZOLE SODIUM 40 MG PO TBEC
40.0000 mg | DELAYED_RELEASE_TABLET | Freq: Every day | ORAL | Status: DC
  Filled 2015-11-19: qty 1

## 2015-11-19 MED ORDER — CEFAZOLIN SODIUM-DEXTROSE 2-3 GM-% IV SOLR
INTRAVENOUS | Status: AC
  Filled 2015-11-19: qty 50

## 2015-11-19 MED ORDER — RAMELTEON 8 MG PO TABS
8.0000 mg | ORAL_TABLET | Freq: Every day | ORAL | Status: DC
  Administered 2015-11-21 – 2015-11-23 (×3): 8 mg via ORAL
  Filled 2015-11-19 (×6): qty 1

## 2015-11-19 MED ORDER — FENTANYL CITRATE (PF) 100 MCG/2ML IJ SOLN
INTRAMUSCULAR | Status: AC
  Filled 2015-11-19: qty 2

## 2015-11-19 MED ORDER — DEXAMETHASONE SODIUM PHOSPHATE 10 MG/ML IJ SOLN
INTRAMUSCULAR | Status: AC
  Filled 2015-11-19: qty 1

## 2015-11-19 MED ORDER — METHOCARBAMOL 500 MG PO TABS: 500.0000 mg | ORAL_TABLET | Freq: Four times a day (QID) | ORAL | Status: DC | PRN

## 2015-11-19 MED ORDER — TRAZODONE HCL 50 MG PO TABS: 50.0000 mg | ORAL_TABLET | Freq: Every evening | ORAL | Status: DC | PRN

## 2015-11-19 MED ORDER — SUCCINYLCHOLINE CHLORIDE 20 MG/ML IJ SOLN
INTRAMUSCULAR | Status: DC | PRN
  Administered 2015-11-19: 100 mg via INTRAVENOUS

## 2015-11-19 MED ORDER — ISOMETHEPTENE-DICHLORAL-APAP 65-100-325 MG PO CAPS: 1.0000 | ORAL_CAPSULE | ORAL | Status: DC | PRN

## 2015-11-19 MED ORDER — ROCURONIUM BROMIDE 100 MG/10ML IV SOLN
INTRAVENOUS | Status: DC | PRN
  Administered 2015-11-19: 30 mg via INTRAVENOUS

## 2015-11-19 MED ORDER — CYANOCOBALAMIN 1000 MCG/ML IJ SOLN
1000.0000 ug | INTRAMUSCULAR | Status: DC
Start: 2015-11-20 — End: 2015-11-24
  Filled 2015-11-19 (×2): qty 1

## 2015-11-19 MED ORDER — PROMETHAZINE HCL 25 MG/ML IJ SOLN: 6.2500 mg | INTRAMUSCULAR | Status: DC | PRN

## 2015-11-19 MED ORDER — ESCITALOPRAM OXALATE 10 MG PO TABS
20.0000 mg | ORAL_TABLET | Freq: Every day | ORAL | Status: DC
  Administered 2015-11-21 – 2015-11-24 (×4): 20 mg via ORAL
  Filled 2015-11-19 (×5): qty 2

## 2015-11-19 MED ORDER — DEXAMETHASONE SODIUM PHOSPHATE 10 MG/ML IJ SOLN
INTRAMUSCULAR | Status: DC | PRN
  Administered 2015-11-19: 10 mg via INTRAVENOUS

## 2015-11-19 MED ORDER — ZOLPIDEM TARTRATE 5 MG PO TABS
10.0000 mg | ORAL_TABLET | Freq: Every day | ORAL | Status: DC
  Filled 2015-11-19: qty 1

## 2015-11-19 MED ORDER — ROCURONIUM BROMIDE 100 MG/10ML IV SOLN
INTRAVENOUS | Status: AC
  Filled 2015-11-19: qty 1

## 2015-11-19 MED ORDER — ONDANSETRON HCL 4 MG/2ML IJ SOLN
4.0000 mg | Freq: Four times a day (QID) | INTRAMUSCULAR | Status: DC | PRN
  Administered 2015-11-19: 4 mg via INTRAVENOUS

## 2015-11-19 MED ORDER — HYDROMORPHONE HCL 1 MG/ML IJ SOLN
1.0000 mg | Freq: Once | INTRAMUSCULAR | Status: AC
  Administered 2015-11-19: 1 mg via INTRAVENOUS
  Filled 2015-11-19: qty 1

## 2015-11-19 MED ORDER — QUETIAPINE FUMARATE 25 MG PO TABS
25.0000 mg | ORAL_TABLET | Freq: Every day | ORAL | Status: DC
  Filled 2015-11-19: qty 1

## 2015-11-19 MED ORDER — LIDOCAINE HCL (CARDIAC) 20 MG/ML IV SOLN
INTRAVENOUS | Status: DC | PRN
  Administered 2015-11-19: 50 mg via INTRAVENOUS

## 2015-11-19 MED ORDER — SUGAMMADEX SODIUM 200 MG/2ML IV SOLN
INTRAVENOUS | Status: AC
  Filled 2015-11-19: qty 4

## 2015-11-19 MED ORDER — HYDROMORPHONE HCL 1 MG/ML IJ SOLN
1.0000 mg | INTRAMUSCULAR | Status: DC | PRN
  Administered 2015-11-19: 1 mg via INTRAVENOUS
  Filled 2015-11-19: qty 1

## 2015-11-19 MED ORDER — ENOXAPARIN SODIUM 40 MG/0.4ML ~~LOC~~ SOLN
40.0000 mg | SUBCUTANEOUS | Status: DC
  Filled 2015-11-19: qty 0.4

## 2015-11-19 MED ORDER — OXYCODONE HCL 5 MG PO TABS
5.0000 mg | ORAL_TABLET | ORAL | Status: DC | PRN
  Administered 2015-11-20 – 2015-11-24 (×3): 10 mg via ORAL
  Filled 2015-11-19 (×5): qty 2

## 2015-11-19 NOTE — ED Notes (Signed)
MD at bedside. 

## 2015-11-19 NOTE — ED Notes (Signed)
Patient with NAD, transferred to OR.

## 2015-11-19 NOTE — ED Notes (Signed)
Patient is resting

## 2015-11-19 NOTE — Anesthesia Preprocedure Evaluation (Signed)
Anesthesia Evaluation  Patient identified by MRN, date of birth, ID band Patient awake    Reviewed: Allergy & Precautions, NPO status , Patient's Chart, lab work & pertinent test results  Airway Mallampati: II  TM Distance: >3 FB Neck ROM: Full    Dental no notable dental hx.    Pulmonary neg pulmonary ROS,    Pulmonary exam normal breath sounds clear to auscultation       Cardiovascular negative cardio ROS Normal cardiovascular exam Rhythm:Regular Rate:Normal     Neuro/Psych  Headaches, PSYCHIATRIC DISORDERS    GI/Hepatic Neg liver ROS, GERD  Medicated,  Endo/Other  negative endocrine ROS  Renal/GU negative Renal ROS  negative genitourinary   Musculoskeletal  (+) Arthritis ,   Abdominal   Peds negative pediatric ROS (+)  Hematology negative hematology ROS (+)   Anesthesia Other Findings   Reproductive/Obstetrics negative OB ROS                             Anesthesia Physical Anesthesia Plan  ASA: II  Anesthesia Plan: General   Post-op Pain Management:    Induction: Intravenous  Airway Management Planned: Oral ETT  Additional Equipment:   Intra-op Plan:   Post-operative Plan: Extubation in OR  Informed Consent: I have reviewed the patients History and Physical, chart, labs and discussed the procedure including the risks, benefits and alternatives for the proposed anesthesia with the patient or authorized representative who has indicated his/her understanding and acceptance.   Dental advisory given  Plan Discussed with: CRNA  Anesthesia Plan Comments:         Anesthesia Quick Evaluation

## 2015-11-19 NOTE — H&P (Signed)
Jasmine Richard is an 59 y.o. female.    Chief Complaint: left femur fracture  HPI: 59 y/o female c/o left knee/leg pain s/p fall earlier this evening. Pt standing on a chair, chair slipped and she fell onto her left knee causing severe immediate pain and inability to bear weight. [facility]. Denies any other injuries other than small abrasion to right index finger. Denies any LOC, syncope or numbness/tingling to distal lower extremities. Currently in flexed position and fairly comfortable. Denies any other issues currently  PCP:  Ginette Otto, MD  PMH: Past Medical History  Diagnosis Date  . GERD (gastroesophageal reflux disease)   . Psoriatic arthritis (HCC)     Dr. Zenovia Jordan  . Osteoarthritis     PSH: Past Surgical History  Procedure Laterality Date  . Cesarean section      x2  . Tonsillectomy and adenoidectomy  1960  . Hysteroscopy w/d&c  07    no polyps  . Gastric bypass  2002    lost 140 lb  . Cholecystectomy  1984  . Colonoscopy  2005    recheck in 5 years  . Tarsal metatarsal arthrodesis  11/28/2012    Procedure: TARSAL METATARSAL FUSION;  Surgeon: Sherri Rad, MD;  Location: [facility];  Service: Orthopedics;  Laterality: Right;  right 2nd and 3rd TMT joint fusion local bone graft stress x ray foot   . Ulnar tunnel release  09/2014  . Carpal tunnel release  09/2014  . Tee without cardioversion N/A 01/07/2015    Procedure: TRANSESOPHAGEAL ECHOCARDIOGRAM (TEE);  Surgeon: Lars Masson, MD;  Location: [facility];  Service: Cardiovascular;  Laterality: N/A;    Social History:  reports that she has never smoked. She has never used smokeless tobacco. She reports that she drinks about 15.0 oz of alcohol per week. She reports that she does not use illicit drugs.  Allergies:  No Known Allergies  Medications: Current Facility-Administered Medications  Medication Dose Route Frequency Provider  Last Rate Last Dose  . HYDROmorphone (DILAUDID) injection 1 mg  1 mg Intravenous Q2H PRN Fayrene Helper, PA-C   1 mg at 11/19/15 1844   Current Outpatient Prescriptions  Medication Sig Dispense Refill  . adalimumab (HUMIRA) 40 MG/0.8ML injection Inject 40 mg into the skin every 14 (fourteen) days.    . Ascorbic Acid (VITAMIN C) 1000 MG tablet Take 1,000 mg by mouth daily.    Marland Kitchen BIOTIN PO Take 1 tablet by mouth daily.    . Calcium Carbonate-Vit D-Min (CALCIUM 1200 PO) Take 1 tablet by mouth 2 (two) times daily.    . cetirizine (ZYRTEC) 10 MG tablet Take 10 mg by mouth daily.    . Cholecalciferol (VITAMIN D3) 2000 UNITS TABS Take 1 tablet by mouth 2 (two) times daily.    . cyanocobalamin (,VITAMIN B-12,) 1000 MCG/ML injection Inject 1 mL (1,000 mcg total) into the muscle every 30 (thirty) days. 10 mL 1  . escitalopram (LEXAPRO) 20 MG tablet Take 1 tablet (20 mg total) by mouth daily. 30 tablet 2  . folic acid (FOLVITE) 1 MG tablet Take 2 mg by mouth 2 (two) times daily.    Marland Kitchen GLUCOSAMINE-CHONDROITIN PO Take by mouth 2 (two) times daily.    Marland Kitchen HYDROcodone-acetaminophen (VICODIN) 5-500 MG per tablet Take 1 tablet by mouth every 6 (six) hours as needed. (Patient not taking: Reported on 05/27/2015) 30 tablet 0  . isometheptene-acetaminophen-dichloralphenazone (MIDRIN) 65-100-325 MG capsule Take 1  capsule by mouth every 4 (four) hours as needed for migraine. Maximum 5 capsules in 12 hours for migraine headaches, 8 capsules in 24 hours for tension headaches. 30 capsule 2  . MAGNESIUM PO Take 250 mg by mouth 2 (two) times daily.    . meloxicam (MOBIC) 15 MG tablet Take 15 mg by mouth daily.    . metaxalone (SKELAXIN) 800 MG tablet TAKE 1 TABLET 3 TIMES A DAY. 40 tablet 1  . methotrexate (RHEUMATREX) 2.5 MG tablet Take 12 mg by mouth once a week.    . MULTIPLE VITAMIN PO Take 0.5 tablets by mouth 2 (two) times daily.    Marland Kitchen omeprazole (PRILOSEC) 40 MG capsule Take 40 mg by mouth 2 (two) times daily.    Marland Kitchen  POTASSIUM PO Take 250 mg by mouth 2 (two) times daily.    . Probiotic Product (PROBIOTIC PO) Take 1 tablet by mouth daily.    . Pyridoxine HCl (VITAMIN B6 PO) Take 400 mg by mouth 2 (two) times daily as needed.    Marland Kitchen QUEtiapine (SEROQUEL) 25 MG tablet Take as directed for sleep 120 tablet 1  . ramelteon (ROZEREM) 8 MG tablet Take 1 tablet (8 mg total) by mouth at bedtime. 30 tablet 2  . SUMAtriptan (IMITREX) 100 MG tablet May repeat in 2 hours if headache persists or recurs. 10 tablet 5  . traZODone (DESYREL) 50 MG tablet     . zolpidem (AMBIEN) 10 MG tablet TAKE 1 TABLET AT BEDTIME AS NEEDED FOR SLEEP. (Patient not taking: Reported on 05/27/2015) 30 tablet 5    Results for orders placed or performed during the hospital encounter of 11/19/15 (from the past 48 hour(s))  CBC with Differential/Platelet     Status: Abnormal   Collection Time: 11/19/15  6:33 PM  Result Value Ref Range   WBC 7.1 4.0 - 10.5 K/uL   RBC 3.71 (L) 3.87 - 5.11 MIL/uL   Hemoglobin 12.7 12.0 - 15.0 g/dL   HCT 19.1 47.8 - 29.5 %   MCV 100.5 (H) 78.0 - 100.0 fL   MCH 34.2 (H) 26.0 - 34.0 pg   MCHC 34.0 30.0 - 36.0 g/dL   RDW 62.1 30.8 - 65.7 %   Platelets 229 150 - 400 K/uL   Neutrophils Relative % 73 %   Neutro Abs 5.2 1.7 - 7.7 K/uL   Lymphocytes Relative 18 %   Lymphs Abs 1.3 0.7 - 4.0 K/uL   Monocytes Relative 8 %   Monocytes Absolute 0.6 0.1 - 1.0 K/uL   Eosinophils Relative 1 %   Eosinophils Absolute 0.1 0.0 - 0.7 K/uL   Basophils Relative 0 %   Basophils Absolute 0.0 0.0 - 0.1 K/uL  I-stat chem 8, ed     Status: Abnormal   Collection Time: 11/19/15  6:40 PM  Result Value Ref Range   Sodium 142 135 - 145 mmol/L   Potassium 4.5 3.5 - 5.1 mmol/L   Chloride 103 101 - 111 mmol/L   BUN 14 6 - 20 mg/dL   Creatinine, Ser 8.46 0.44 - 1.00 mg/dL   Glucose, Bld 962 (H) 65 - 99 mg/dL   Calcium, Ion 9.52 8.41 - 1.30 mmol/L   TCO2 27 0 - 100 mmol/L   Hemoglobin 13.6 12.0 - 15.0 g/dL   HCT 32.4 40.1 - 02.7 %    Dg Chest Portable 1 View  11/19/2015  CLINICAL DATA:  59 year old female with preop evaluation EXAM: PORTABLE CHEST 1 VIEW COMPARISON:  Radiographs dated 07/07/2009  FINDINGS: Single-view of the chest does not demonstrate any focal consolidation, pleural effusion, or pneumothorax. There is mild diffuse interstitial prominence. Cyst cardiac silhouette is within normal limits. The osseous structures are grossly unremarkable. Surgical clips noted gastric area. IMPRESSION: No active disease. Electronically Signed   By: Elgie Collard [name]   On: 11/19/2015 18:19   Dg Knee Complete 4 Views Left  11/19/2015  CLINICAL DATA:  Larey Seat while standing on a chair today, fell onto LEFT knee, generalized LEFT knee pain and swelling, unable to fully extend knee, initial encounter EXAM: LEFT KNEE - COMPLETE 4+ VIEW COMPARISON:  None FINDINGS: Diffuse osseous demineralization. Comminuted displaced distal metaphyseal fracture of the LEFT femur extending into the intercondylar notch. Posterior dislocation on of the distal condylar fragments with apex anterior and lateral angulation. Associated soft tissue swelling. Patella, tibia, and fibula appear grossly intact. IMPRESSION: Comminuted displaced and angulated intra-articular distal LEFT femoral metaphyseal fracture as above. Electronically Signed   By: Ulyses Southward [name]   On: 11/19/2015 17:37    ROS: ROS Inability to walk [facility]  Physical Exam: Alert and appropriate 59 y/o female in no acute distress Cervical spine with full rom no tenderness or deformity Bilateral upper extremities show full rom, no tenderness, small abrasion to right index finger but no deformity Pelvis stable Right lower extremity full rom with no tenderness or deformity Left lower extremity: currently in flexed position  Not taken thru any rom due to known fracture  nv intact distally bilateral lower extremity Physical Exam   Assessment/Plan Assessment: left  distal femur fracture  Plan: Admit for surgical management for left distal femur fracture Will obtain CT scan to evaluate extent of the distal femur fracture and determine the best surgical management Pain management  Strict bedrest  Recommend foley for ease with restroom Keep NPO for now

## 2015-11-19 NOTE — Anesthesia Procedure Notes (Signed)
Procedure Name: Intubation Date/Time: 11/19/2015 11:45 PM Performed by: Gavyn Ybarra, Nuala Alpha Pre-anesthesia Checklist: Patient identified, Emergency Drugs available, Suction available, Patient being monitored and Timeout performed Patient Re-evaluated:Patient Re-evaluated prior to inductionOxygen Delivery Method: Circle system utilized Preoxygenation: Pre-oxygenation with 100% oxygen Intubation Type: IV induction Ventilation: Mask ventilation without difficulty Laryngoscope Size: Mac and 4 Grade View: Grade II Tube type: Oral Tube size: 7.5 mm Number of attempts: 1 Airway Equipment and Method: Stylet Placement Confirmation: ETT inserted through vocal cords under direct vision,  positive ETCO2,  CO2 detector and breath sounds checked- equal and bilateral Secured at: 21 cm Tube secured with: Tape Dental Injury: Teeth and Oropharynx as per pre-operative assessment

## 2015-11-19 NOTE — ED Provider Notes (Signed)
CSN: [id number]     Arrival date & time 11/19/15  1653 History   First MD Initiated Contact with Patient 11/19/15 1655     No chief complaint on file.    (Consider location/radiation/quality/duration/timing/severity/associated sxs/prior Treatment) HPI  59 year old female with hx of osteoarthritis, psoriatic arthritis brought here via EMS from [facility] for evaluation of L knee injury.  Pt report an hr ago she was standing on a chair trying to reach towards the cabinet when she lost control, fell forward and landed directly on her L knee.  She report acute onset of sharp pain to anterior knee, 10/10, non radiating and unable to ambulate afterward.  She denies striking her head or LOC.  No other injury.  No precipitating sxs prior to the fall.  She is not on any anticoagulant.  Denies prior injury to the same knee.  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  She did received of Fentanyl PTA.  She denies L hip or L ankle pain.  Denies numbness or weakness.    Past Medical History  Diagnosis Date  . GERD (gastroesophageal reflux disease)   . Psoriatic arthritis     Dr. Zenovia Jordan  . Osteoarthritis    Past Surgical History  Procedure Laterality Date  . Cesarean section      x2  . Tonsillectomy and adenoidectomy  1960  . Hysteroscopy w/d&c  07    no polyps  . Gastric bypass  2002    lost 140 lb  . Cholecystectomy  1984  . Colonoscopy  2005    recheck in 5 years  . Tarsal metatarsal arthrodesis  11/28/2012    Procedure: TARSAL METATARSAL FUSION;  Surgeon: Sherri Rad, MD;  Location: [facility];  Service: Orthopedics;  Laterality: Right;  right 2nd and 3rd TMT joint fusion local bone graft stress x ray foot   . Ulnar tunnel release  09/2014  . Carpal tunnel release  09/2014  . Tee without cardioversion N/A 01/07/2015    Procedure: TRANSESOPHAGEAL ECHOCARDIOGRAM (TEE);  Surgeon: Lars Masson, MD;  Location: [facility] ENDOSCOPY;  Service:  Cardiovascular;  Laterality: N/A;   Family History  Problem Relation Age of Onset  . Cervical cancer Mother   . Hypertension Father   . Heart failure Father   . Hypertension Brother     also with hip replacements  . Hyperlipidemia Brother   . Alcohol abuse Paternal Uncle    Social History  Substance Use Topics  . Smoking status: Never Smoker   . Smokeless tobacco: Never Used  . Alcohol Use: 15.0 oz/week    25 Glasses of wine per week     Comment: occ   OB History    Gravida Para Term Preterm AB TAB SAB Ectopic Multiple Living   3 3 3       3      Review of Systems  Constitutional: Negative for fever.  Musculoskeletal: Positive for joint swelling and arthralgias.  Skin: Negative for wound.  Neurological: Negative for numbness.      Allergies  Review of patient's allergies indicates no known allergies.  Home Medications   Prior to Admission medications   Medication Sig Start Date End Date Taking? Authorizing Provider  adalimumab (HUMIRA) 40 MG/0.8ML injection Inject 40 mg into the skin every 14 (fourteen) days.    Historical Provider, MD  Ascorbic Acid (VITAMIN C) 1000 MG tablet Take 1,000 mg by mouth daily.  Historical Provider, MD  BIOTIN PO Take 1 tablet by mouth daily.    Historical Provider, MD  Calcium Carbonate-Vit D-Min (CALCIUM 1200 PO) Take 1 tablet by mouth 2 (two) times daily.    Historical Provider, MD  cetirizine (ZYRTEC) 10 MG tablet Take 10 mg by mouth daily.    Historical Provider, MD  Cholecalciferol (VITAMIN D3) 2000 UNITS TABS Take 1 tablet by mouth 2 (two) times daily.    Historical Provider, MD  cyanocobalamin (,VITAMIN B-12,) 1000 MCG/ML injection Inject 1 mL (1,000 mcg total) into the muscle every 30 (thirty) days. 11/03/14   Ria Comment, FNP  escitalopram (LEXAPRO) 20 MG tablet Take 1 tablet (20 mg total) by mouth daily. 06/09/15 06/08/16  Court Joy, PA-C  folic acid (FOLVITE) 1 MG tablet Take 2 mg by mouth 2 (two) times daily.     Historical Provider, MD  GLUCOSAMINE-CHONDROITIN PO Take by mouth 2 (two) times daily.    Historical Provider, MD  HYDROcodone-acetaminophen (VICODIN) 5-500 MG per tablet Take 1 tablet by mouth every 6 (six) hours as needed. Patient not taking: Reported on 05/27/2015 10/16/13   Ria Comment, FNP  isometheptene-acetaminophen-dichloralphenazone (MIDRIN) [phone] MG capsule Take 1 capsule by mouth every 4 (four) hours as needed for migraine. Maximum 5 capsules in 12 hours for migraine headaches, 8 capsules in 24 hours for tension headaches. 05/28/15 05/27/16  Court Joy, PA-C  MAGNESIUM PO Take 250 mg by mouth 2 (two) times daily.    Historical Provider, MD  meloxicam (MOBIC) 15 MG tablet Take 15 mg by mouth daily.    Historical Provider, MD  metaxalone (SKELAXIN) 800 MG tablet TAKE 1 TABLET 3 TIMES A DAY. 04/23/15   Ria Comment, FNP  methotrexate (RHEUMATREX) 2.5 MG tablet Take 12 mg by mouth once a week.    Historical Provider, MD  MULTIPLE VITAMIN PO Take 0.5 tablets by mouth 2 (two) times daily.    Historical Provider, MD  omeprazole (PRILOSEC) 40 MG capsule Take 40 mg by mouth 2 (two) times daily.    Historical Provider, MD  POTASSIUM PO Take 250 mg by mouth 2 (two) times daily.    Historical Provider, MD  Probiotic Product (PROBIOTIC PO) Take 1 tablet by mouth daily.    Historical Provider, MD  Pyridoxine HCl (VITAMIN B6 PO) Take 400 mg by mouth 2 (two) times daily as needed.    Historical Provider, MD  QUEtiapine (SEROQUEL) 25 MG tablet Take as directed for sleep 07/19/15   Court Joy, PA-C  ramelteon (ROZEREM) 8 MG tablet Take 1 tablet (8 mg total) by mouth at bedtime. 07/14/15   Court Joy, PA-C  SUMAtriptan (IMITREX) 100 MG tablet May repeat in 2 hours if headache persists or recurs. 11/03/14   Ria Comment, FNP  traZODone (DESYREL) 50 MG tablet  07/13/15   Historical Provider, MD  zolpidem (AMBIEN) 10 MG tablet TAKE 1 TABLET AT BEDTIME AS NEEDED FOR SLEEP. Patient not taking:  Reported on 05/27/2015 01/28/15   Ria Comment, FNP   LMP 04/19/2011 Physical Exam  Constitutional: She appears well-developed and well-nourished. No distress.  Obese Caucasian female in NAD.  HENT:  Head: Atraumatic.  Eyes: Conjunctivae are normal.  Neck: Neck supple.  Musculoskeletal: She exhibits tenderness (L knee: moderate edema noted to suprapatella with redness and tenderness on palpation, no crepitus.  decrease knee flexion/extension.  No  obvious joint laxity).  L   Neurological: She is alert.  Skin: No rash noted.  Psychiatric: She has a  normal mood and affect.  Nursing note and vitals reviewed.   ED Course  Procedures (including critical care time) Labs Review Labs Reviewed  CBC WITH DIFFERENTIAL/PLATELET - Abnormal; Notable for the following:    RBC 3.71 (*)    MCV 100.5 (*)    MCH 34.2 (*)    All other components within normal limits  I-STAT CHEM 8, ED - Abnormal; Notable for the following:    Glucose, Bld 115 (*)    All other components within normal limits    Imaging Review Ct Knee Left Wo Contrast  11/19/2015  CLINICAL DATA:  59 year old female with fall and left knee pain EXAM: CT OF THE left KNEE WITHOUT CONTRAST TECHNIQUE: Multidetector CT imaging of the left knee was performed according to the standard protocol. Multiplanar CT image reconstructions were also generated. COMPARISON:  Radiograph dated 11/19/15 FINDINGS: The bones are osteopenic. There is a comminuted fracture of distal femoral metaphysis with extension of the fracture lines into the articular surface of the medial condyle. There is also extension of the fracture into the lateral epicondyle. There is posterior angulation of the distal fracture fragment at the distal femoral metaphysis. The visualized tibia and fibula appear unremarkable. There is anatomic alignment of the distal femur and proximal tibia. The patella appears unremarkable. There is suprapatellar hemarthrosis as well as diffuse soft  tissues teratoma. IMPRESSION: Comminuted intra-articular fracture of the distal femoral metaphysis with posterior angulation of the distal fracture fragment. Electronically Signed   By: Elgie Collard [name]   On: 11/19/2015 21:07   Dg Chest Portable 1 View  11/19/2015  CLINICAL DATA:  59 year old female with preop evaluation EXAM: PORTABLE CHEST 1 VIEW COMPARISON:  Radiographs dated 07/07/2009 FINDINGS: Single-view of the chest does not demonstrate any focal consolidation, pleural effusion, or pneumothorax. There is mild diffuse interstitial prominence. Cyst cardiac silhouette is within normal limits. The osseous structures are grossly unremarkable. Surgical clips noted gastric area. IMPRESSION: No active disease. Electronically Signed   By: Elgie Collard [name]   On: 11/19/2015 18:19   Dg Knee Complete 4 Views Left  11/19/2015  CLINICAL DATA:  Larey Seat while standing on a chair today, fell onto LEFT knee, generalized LEFT knee pain and swelling, unable to fully extend knee, initial encounter EXAM: LEFT KNEE - COMPLETE 4+ VIEW COMPARISON:  None FINDINGS: Diffuse osseous demineralization. Comminuted displaced distal metaphyseal fracture of the LEFT femur extending into the intercondylar notch. Posterior dislocation on of the distal condylar fragments with apex anterior and lateral angulation. Associated soft tissue swelling. Patella, tibia, and fibula appear grossly intact. IMPRESSION: Comminuted displaced and angulated intra-articular distal LEFT femoral metaphyseal fracture as above. Electronically Signed   By: Ulyses Southward [name]   On: 11/19/2015 17:37   I have personally reviewed and evaluated these images and lab results as part of my medical decision-making.   EKG Interpretation   Date/Time:  Friday November 19 2015 18:09:14 EST Ventricular Rate:  61 PR Interval:  142 QRS Duration: 74 QT Interval:  427 QTC Calculation: 430 R Axis:   79 Text Interpretation:  Sinus rhythm Consider left atrial  enlargement  Otherwise normal ECG No old tracing to compare Confirmed by GOLDSTON  MD,  SCOTT ([id number]) on 11/19/2015 6:13:45 PM      MDM   Final diagnoses:  Femoral distal fracture, left, closed, initial encounter    BP 133/78 mmHg  Pulse 61  Temp(Src) 98.2 F (36.8 C) (Oral)  Resp 11  SpO2 100%  LMP 04/19/2011  Pt here with a mechanical L knee injury from falling down from a chair.  Pain medication given, xray ordered.  She is NVI.    6:09 PM X-ray of left knee demonstrated a comminuted displaced and angulated intra-articular distal left femoral metaphyseal fracture. This is a closed injury. Injury likely will need surgical repair. I will obtain his labs, EKG, chest x-ray. Patient's orthopedist is Dr. Rennis Chris of whom I will attempt to consult.    6:33 PM Appreciate consultation from orthopedist Dr. Ranell Patrick, who request for a left knee CT with 3d recon along with sagittal and coronal reconstruction.  He will see pt in ER and will determine further care.    Fayrene Helper, PA-C 11/19/15 3300  Pricilla Loveless, MD 11/19/15 [phone]

## 2015-11-19 NOTE — ED Notes (Addendum)
Patient here from [facility], fall from chair. Injury to left knee. Pain 10/10 redness, swelling. Fent given

## 2015-11-19 NOTE — ED Notes (Signed)
Patient transported to CT 

## 2015-11-19 NOTE — ED Notes (Signed)
Consent signed for procedure, blood consent signed. Patient states she understands both forms.

## 2015-11-20 ENCOUNTER — Inpatient Hospital Stay (HOSPITAL_COMMUNITY): Admitting: Anesthesiology

## 2015-11-20 ENCOUNTER — Inpatient Hospital Stay (HOSPITAL_COMMUNITY)

## 2015-11-20 ENCOUNTER — Encounter (HOSPITAL_COMMUNITY)
Admission: EM | Disposition: A | Discharge status: Skilled Nursing Facility | Payer: Self-pay | Source: Home / Self Care | Attending: Orthopaedic Surgery

## 2015-11-20 HISTORY — PX: ORIF FEMUR FRACTURE: SHX2119

## 2015-11-20 LAB — CBC
HEMATOCRIT: 28.5 % — AB (ref 36.0–46.0)
Hemoglobin: 9.6 g/dL — ABNORMAL LOW (ref 12.0–15.0)
MCH: 33.8 pg (ref 26.0–34.0)
MCHC: 33.7 g/dL (ref 30.0–36.0)
MCV: 100.4 fL — ABNORMAL HIGH (ref 78.0–100.0)
Platelets: 160 10*3/uL (ref 150–400)
RBC: 2.84 MIL/uL — ABNORMAL LOW (ref 3.87–5.11)
RDW: 13.2 % (ref 11.5–15.5)
WBC: 12.2 10*3/uL — ABNORMAL HIGH (ref 4.0–10.5)

## 2015-11-20 LAB — CREATININE, SERUM
CREATININE: 0.77 mg/dL (ref 0.44–1.00)
GFR calc Af Amer: >60 mL/min (ref >60)

## 2015-11-20 SURGERY — OPEN REDUCTION INTERNAL FIXATION (ORIF) DISTAL FEMUR FRACTURE
Anesthesia: General | Site: Leg Lower | Laterality: Left

## 2015-11-20 MED ORDER — VITAMIN B-1 100 MG PO TABS
100.0000 mg | ORAL_TABLET | Freq: Every day | ORAL | Status: DC
  Administered 2015-11-21 – 2015-11-24 (×4): 100 mg via ORAL
  Filled 2015-11-20 (×4): qty 1

## 2015-11-20 MED ORDER — PROPOFOL 10 MG/ML IV BOLUS
INTRAVENOUS | Status: AC
  Filled 2015-11-20: qty 20

## 2015-11-20 MED ORDER — BISACODYL 10 MG RE SUPP: 10.0000 mg | Freq: Every day | RECTAL | Status: DC | PRN

## 2015-11-20 MED ORDER — ACETAMINOPHEN 500 MG PO TABS: 1000.0000 mg | ORAL_TABLET | Freq: Three times a day (TID) | ORAL | Status: DC | PRN

## 2015-11-20 MED ORDER — OXYCODONE-ACETAMINOPHEN 5-325 MG PO TABS: 1.0000 | ORAL_TABLET | ORAL | Status: DC | PRN

## 2015-11-20 MED ORDER — CEFAZOLIN SODIUM-DEXTROSE 2-3 GM-% IV SOLR
2.0000 g | Freq: Four times a day (QID) | INTRAVENOUS | Status: AC
  Administered 2015-11-20 – 2015-11-21 (×3): 2 g via INTRAVENOUS
  Filled 2015-11-20 (×4): qty 50

## 2015-11-20 MED ORDER — ACETAMINOPHEN 325 MG PO TABS
650.0000 mg | ORAL_TABLET | Freq: Four times a day (QID) | ORAL | Status: DC | PRN
  Administered 2015-11-21: 650 mg via ORAL
  Filled 2015-11-20: qty 2

## 2015-11-20 MED ORDER — POLYETHYLENE GLYCOL 3350 17 G PO PACK
17.0000 g | PACK | Freq: Every day | ORAL | Status: DC | PRN
  Filled 2015-11-20: qty 1

## 2015-11-20 MED ORDER — SUGAMMADEX SODIUM 500 MG/5ML IV SOLN
INTRAVENOUS | Status: DC | PRN
  Administered 2015-11-20: 300 mg via INTRAVENOUS

## 2015-11-20 MED ORDER — EPHEDRINE SULFATE 50 MG/ML IJ SOLN
INTRAMUSCULAR | Status: AC
  Filled 2015-11-20: qty 1

## 2015-11-20 MED ORDER — ADULT MULTIVITAMIN W/MINERALS CH
1.0000 | ORAL_TABLET | Freq: Every day | ORAL | Status: DC
  Administered 2015-11-21 – 2015-11-24 (×4): 1 via ORAL
  Filled 2015-11-20 (×4): qty 1

## 2015-11-20 MED ORDER — OXYCODONE HCL 5 MG/5ML PO SOLN: 5.0000 mg | Freq: Once | ORAL | Status: DC | PRN

## 2015-11-20 MED ORDER — MORPHINE SULFATE (PF) 2 MG/ML IV SOLN: 1.0000 mg | INTRAVENOUS | Status: DC | PRN

## 2015-11-20 MED ORDER — SODIUM CHLORIDE 0.9 % IV SOLN
INTRAVENOUS | Status: DC
  Administered 2015-11-20 – 2015-11-21 (×2): via INTRAVENOUS

## 2015-11-20 MED ORDER — ENOXAPARIN SODIUM 40 MG/0.4ML ~~LOC~~ SOLN
40.0000 mg | SUBCUTANEOUS | Status: DC
  Administered 2015-11-21 – 2015-11-24 (×4): 40 mg via SUBCUTANEOUS
  Filled 2015-11-20 (×4): qty 0.4

## 2015-11-20 MED ORDER — DIPHENHYDRAMINE HCL 12.5 MG/5ML PO ELIX: 25.0000 mg | ORAL_SOLUTION | ORAL | Status: DC | PRN

## 2015-11-20 MED ORDER — HYDROMORPHONE HCL 1 MG/ML IJ SOLN
INTRAMUSCULAR | Status: AC
  Administered 2015-11-20: 0.5 mg via INTRAVENOUS
  Filled 2015-11-20: qty 1

## 2015-11-20 MED ORDER — MIDAZOLAM HCL 2 MG/2ML IJ SOLN
INTRAMUSCULAR | Status: AC
  Filled 2015-11-20: qty 2

## 2015-11-20 MED ORDER — METOCLOPRAMIDE HCL 5 MG PO TABS: 5.0000 mg | ORAL_TABLET | Freq: Three times a day (TID) | ORAL | Status: DC | PRN

## 2015-11-20 MED ORDER — FENTANYL CITRATE (PF) 100 MCG/2ML IJ SOLN
25.0000 ug | INTRAMUSCULAR | Status: DC | PRN
  Administered 2015-11-20 (×2): 25 ug via INTRAVENOUS

## 2015-11-20 MED ORDER — FENTANYL CITRATE (PF) 250 MCG/5ML IJ SOLN
INTRAMUSCULAR | Status: AC
  Filled 2015-11-20: qty 5

## 2015-11-20 MED ORDER — CEFAZOLIN SODIUM-DEXTROSE 2-3 GM-% IV SOLR
2.0000 g | INTRAVENOUS | Status: AC
  Filled 2015-11-20: qty 50

## 2015-11-20 MED ORDER — ONDANSETRON HCL 4 MG/2ML IJ SOLN: 4.0000 mg | Freq: Four times a day (QID) | INTRAMUSCULAR | Status: DC | PRN

## 2015-11-20 MED ORDER — PROPOFOL 10 MG/ML IV BOLUS
INTRAVENOUS | Status: DC | PRN
  Administered 2015-11-20: 200 mg via INTRAVENOUS

## 2015-11-20 MED ORDER — SENNOSIDES-DOCUSATE SODIUM 8.6-50 MG PO TABS: 1.0000 | ORAL_TABLET | Freq: Every evening | ORAL | Status: DC | PRN

## 2015-11-20 MED ORDER — MIRTAZAPINE 15 MG PO TABS
15.0000 mg | ORAL_TABLET | Freq: Every day | ORAL | Status: DC
  Administered 2015-11-20 – 2015-11-23 (×4): 15 mg via ORAL
  Filled 2015-11-20 (×4): qty 1

## 2015-11-20 MED ORDER — KETOROLAC TROMETHAMINE 30 MG/ML IJ SOLN
INTRAMUSCULAR | Status: AC
  Filled 2015-11-20: qty 1

## 2015-11-20 MED ORDER — HYDROMORPHONE HCL 1 MG/ML IJ SOLN
0.2500 mg | INTRAMUSCULAR | Status: DC | PRN
  Administered 2015-11-20 (×3): 0.5 mg via INTRAVENOUS

## 2015-11-20 MED ORDER — LACTATED RINGERS IV SOLN
INTRAVENOUS | Status: DC
  Administered 2015-11-20: 09:00:00 via INTRAVENOUS

## 2015-11-20 MED ORDER — KETOROLAC TROMETHAMINE 30 MG/ML IJ SOLN
INTRAMUSCULAR | Status: DC | PRN
  Administered 2015-11-20: 30 mg via INTRAVENOUS

## 2015-11-20 MED ORDER — 0.9 % SODIUM CHLORIDE (POUR BTL) OPTIME
TOPICAL | Status: DC | PRN
  Administered 2015-11-20: 1000 mL

## 2015-11-20 MED ORDER — VITAMIN C 500 MG PO TABS
500.0000 mg | ORAL_TABLET | Freq: Two times a day (BID) | ORAL | Status: DC
  Administered 2015-11-20 – 2015-11-24 (×8): 500 mg via ORAL
  Filled 2015-11-20 (×8): qty 1

## 2015-11-20 MED ORDER — ONDANSETRON HCL 4 MG/2ML IJ SOLN
INTRAMUSCULAR | Status: AC
  Filled 2015-11-20: qty 2

## 2015-11-20 MED ORDER — ENOXAPARIN SODIUM 40 MG/0.4ML ~~LOC~~ SOLN: 40.0000 mg | Freq: Every day | SUBCUTANEOUS | Status: DC

## 2015-11-20 MED ORDER — CEFAZOLIN SODIUM-DEXTROSE 2-3 GM-% IV SOLR
INTRAVENOUS | Status: DC | PRN
  Administered 2015-11-20: 2 g via INTRAVENOUS

## 2015-11-20 MED ORDER — FLUTICASONE PROPIONATE 50 MCG/ACT NA SUSP
1.0000 | Freq: Every day | NASAL | Status: DC
  Administered 2015-11-21 – 2015-11-23 (×3): 2 via NASAL
  Administered 2015-11-24: 1 via NASAL
  Filled 2015-11-20: qty 16

## 2015-11-20 MED ORDER — FENTANYL CITRATE (PF) 100 MCG/2ML IJ SOLN
INTRAMUSCULAR | Status: AC
  Administered 2015-11-20: 25 ug via INTRAVENOUS
  Filled 2015-11-20: qty 2

## 2015-11-20 MED ORDER — METOCLOPRAMIDE HCL 5 MG/ML IJ SOLN
5.0000 mg | Freq: Three times a day (TID) | INTRAMUSCULAR | Status: DC | PRN
Start: 2015-11-20 — End: 2015-11-24

## 2015-11-20 MED ORDER — METHOCARBAMOL 500 MG PO TABS
500.0000 mg | ORAL_TABLET | Freq: Four times a day (QID) | ORAL | Status: DC | PRN
  Administered 2015-11-21 – 2015-11-22 (×2): 500 mg via ORAL
  Filled 2015-11-20 (×6): qty 1

## 2015-11-20 MED ORDER — OXYCODONE HCL 5 MG PO TABS: 5.0000 mg | ORAL_TABLET | Freq: Once | ORAL | Status: DC | PRN

## 2015-11-20 MED ORDER — ONDANSETRON HCL 4 MG PO TABS: 4.0000 mg | ORAL_TABLET | Freq: Four times a day (QID) | ORAL | Status: DC | PRN

## 2015-11-20 MED ORDER — METHOCARBAMOL 750 MG PO TABS: 750.0000 mg | ORAL_TABLET | Freq: Two times a day (BID) | ORAL | Status: DC | PRN

## 2015-11-20 MED ORDER — ACETAMINOPHEN 650 MG RE SUPP: 650.0000 mg | Freq: Four times a day (QID) | RECTAL | Status: DC | PRN

## 2015-11-20 MED ORDER — OXYCODONE HCL ER 10 MG PO T12A: 10.0000 mg | EXTENDED_RELEASE_TABLET | Freq: Two times a day (BID) | ORAL | Status: DC

## 2015-11-20 MED ORDER — ONDANSETRON HCL 4 MG PO TABS: 4.0000 mg | ORAL_TABLET | Freq: Three times a day (TID) | ORAL | Status: DC | PRN

## 2015-11-20 MED ORDER — METHOCARBAMOL 500 MG PO TABS
500.0000 mg | ORAL_TABLET | Freq: Four times a day (QID) | ORAL | Status: DC | PRN
  Administered 2015-11-21 – 2015-11-24 (×6): 500 mg via ORAL
  Filled 2015-11-20 (×2): qty 1

## 2015-11-20 MED ORDER — DOCUSATE SODIUM 100 MG PO CAPS
100.0000 mg | ORAL_CAPSULE | Freq: Two times a day (BID) | ORAL | Status: DC
  Administered 2015-11-20 – 2015-11-24 (×8): 100 mg via ORAL
  Filled 2015-11-20 (×8): qty 1

## 2015-11-20 MED ORDER — LIDOCAINE HCL (CARDIAC) 20 MG/ML IV SOLN
INTRAVENOUS | Status: DC | PRN
  Administered 2015-11-20: 50 mg via INTRAVENOUS

## 2015-11-20 MED ORDER — PHENYLEPHRINE HCL 10 MG/ML IJ SOLN
INTRAMUSCULAR | Status: DC | PRN
  Administered 2015-11-20: 120 ug via INTRAVENOUS

## 2015-11-20 MED ORDER — PHENYLEPHRINE 40 MCG/ML (10ML) SYRINGE FOR IV PUSH (FOR BLOOD PRESSURE SUPPORT)
PREFILLED_SYRINGE | INTRAVENOUS | Status: AC
  Filled 2015-11-20: qty 10

## 2015-11-20 MED ORDER — ACETAMINOPHEN 325 MG PO TABS: 650.0000 mg | ORAL_TABLET | Freq: Four times a day (QID) | ORAL | Status: DC | PRN

## 2015-11-20 MED ORDER — ONDANSETRON HCL 4 MG/2ML IJ SOLN
INTRAMUSCULAR | Status: DC | PRN
  Administered 2015-11-20: 4 mg via INTRAVENOUS

## 2015-11-20 MED ORDER — MEPERIDINE HCL 25 MG/ML IJ SOLN: 6.2500 mg | INTRAMUSCULAR | Status: DC | PRN

## 2015-11-20 MED ORDER — LIDOCAINE HCL (CARDIAC) 20 MG/ML IV SOLN
INTRAVENOUS | Status: AC
  Filled 2015-11-20: qty 5

## 2015-11-20 MED ORDER — PANTOPRAZOLE SODIUM 40 MG PO TBEC
80.0000 mg | DELAYED_RELEASE_TABLET | Freq: Every day | ORAL | Status: DC
Start: 2015-11-20 — End: 2015-11-24
  Administered 2015-11-21 – 2015-11-24 (×4): 80 mg via ORAL
  Filled 2015-11-20 (×4): qty 2

## 2015-11-20 MED ORDER — DEXTROSE 5 % IV SOLN
500.0000 mg | Freq: Four times a day (QID) | INTRAVENOUS | Status: DC | PRN
  Filled 2015-11-20: qty 5

## 2015-11-20 MED ORDER — FENTANYL CITRATE (PF) 100 MCG/2ML IJ SOLN
INTRAMUSCULAR | Status: DC | PRN
  Administered 2015-11-20: 50 ug via INTRAVENOUS
  Administered 2015-11-20 (×2): 25 ug via INTRAVENOUS
  Administered 2015-11-20: 50 ug via INTRAVENOUS
  Administered 2015-11-20 (×4): 25 ug via INTRAVENOUS

## 2015-11-20 MED ORDER — OXYCODONE HCL 5 MG PO TABS
5.0000 mg | ORAL_TABLET | ORAL | Status: DC | PRN
  Administered 2015-11-20 (×2): 10 mg via ORAL
  Administered 2015-11-21 (×2): 15 mg via ORAL
  Administered 2015-11-21: 10 mg via ORAL
  Administered 2015-11-21 – 2015-11-22 (×6): 15 mg via ORAL
  Administered 2015-11-22: 10 mg via ORAL
  Administered 2015-11-22 – 2015-11-23 (×3): 15 mg via ORAL
  Administered 2015-11-23: 10 mg via ORAL
  Administered 2015-11-23: 15 mg via ORAL
  Administered 2015-11-23: 10 mg via ORAL
  Administered 2015-11-23 – 2015-11-24 (×5): 15 mg via ORAL
  Filled 2015-11-20 (×7): qty 3
  Filled 2015-11-20: qty 2
  Filled 2015-11-20: qty 3
  Filled 2015-11-20: qty 2
  Filled 2015-11-20 (×4): qty 3
  Filled 2015-11-20: qty 2
  Filled 2015-11-20: qty 3
  Filled 2015-11-20: qty 2
  Filled 2015-11-20 (×5): qty 3

## 2015-11-20 MED ORDER — HYDROMORPHONE HCL 2 MG/ML IJ SOLN
INTRAMUSCULAR | Status: AC
  Filled 2015-11-20: qty 1

## 2015-11-20 MED ORDER — LACTATED RINGERS IV SOLN
INTRAVENOUS | Status: DC | PRN
  Administered 2015-11-20: 10:00:00 via INTRAVENOUS

## 2015-11-20 MED ORDER — METOCLOPRAMIDE HCL 5 MG/ML IJ SOLN: 5.0000 mg | Freq: Three times a day (TID) | INTRAMUSCULAR | Status: DC | PRN

## 2015-11-20 MED ORDER — SODIUM CHLORIDE 0.9 % IV SOLN: INTRAVENOUS | Status: DC

## 2015-11-20 MED ORDER — METHOCARBAMOL 1000 MG/10ML IJ SOLN
500.0000 mg | Freq: Four times a day (QID) | INTRAVENOUS | Status: DC | PRN
  Administered 2015-11-20: 500 mg via INTRAVENOUS
  Filled 2015-11-20 (×2): qty 5

## 2015-11-20 SURGICAL SUPPLY — 70 items
BANDAGE ELASTIC 4 VELCRO ST LF (GAUZE/BANDAGES/DRESSINGS) ×4 IMPLANT
BANDAGE ELASTIC 6 VELCRO ST LF (GAUZE/BANDAGES/DRESSINGS) ×4 IMPLANT
BANDAGE ESMARK 6X9 LF (GAUZE/BANDAGES/DRESSINGS) ×2 IMPLANT
BIT DRILL 3.2MM (BIT) ×1
BIT DRILL LOCK 4.3 (BIT) ×3 IMPLANT
BIT DRILL LOCK 4.3 SHORT (BIT) ×3 IMPLANT
BIT DRILL LOCKING 4.3M MEDIUM (BIT) ×3 IMPLANT
BIT DRILL LOCKING 4.3MM (BIT) ×1
BIT DRILL LOCKING 4.3MM SHORT (BIT) ×1
BIT DRILL NLOCK SHRT 3.2X216 (BIT) ×6 IMPLANT
BNDG ESMARK 6X9 LF (GAUZE/BANDAGES/DRESSINGS) ×4
COVER MAYO STAND STRL (DRAPES) ×4 IMPLANT
COVER SURGICAL LIGHT HANDLE (MISCELLANEOUS) ×4 IMPLANT
CUFF TOURNIQUET SINGLE 34IN LL (TOURNIQUET CUFF) IMPLANT
DRAPE C-ARM 42X72 X-RAY (DRAPES) ×4 IMPLANT
DRAPE C-ARMOR (DRAPES) ×4 IMPLANT
DRAPE EXTREMITY T 121X128X90 (DRAPE) ×4 IMPLANT
DRAPE IMP U-DRAPE 54X76 (DRAPES) ×8 IMPLANT
DRAPE POUCH INSTRU U-SHP 10X18 (DRAPES) ×4 IMPLANT
DRAPE U-SHAPE 47X51 STRL (DRAPES) ×8 IMPLANT
DRAPE UTILITY XL STRL (DRAPES) ×8 IMPLANT
DRSG MEPILEX BORDER 4X4 (GAUZE/BANDAGES/DRESSINGS) ×4 IMPLANT
DRSG PAD ABDOMINAL 8X10 ST (GAUZE/BANDAGES/DRESSINGS) ×4 IMPLANT
DURAPREP 26ML APPLICATOR (WOUND CARE) ×4 IMPLANT
ELECT CAUTERY BLADE 6.4 (BLADE) ×4 IMPLANT
ELECT REM PT RETURN 9FT ADLT (ELECTROSURGICAL) ×4
ELECTRODE REM PT RTRN 9FT ADLT (ELECTROSURGICAL) ×2 IMPLANT
FACESHIELD WRAPAROUND (MASK) IMPLANT
GAUZE SPONGE 4X4 12PLY STRL (GAUZE/BANDAGES/DRESSINGS) ×4 IMPLANT
GAUZE XEROFORM 1X8 LF (GAUZE/BANDAGES/DRESSINGS) ×4 IMPLANT
GLOVE BIOGEL PI ORTHO PRO SZ7 (GLOVE) ×2
GLOVE NEODERM STRL 7.5 LF PF (GLOVE) ×8 IMPLANT
GLOVE PI ORTHO PRO STRL SZ7 (GLOVE) ×2 IMPLANT
GLOVE SKINSENSE NS SZ7.5 (GLOVE) ×2
GLOVE SKINSENSE STRL SZ7.5 (GLOVE) ×2 IMPLANT
GLOVE SURG NEODERM 7.5  LF PF (GLOVE) ×8
GLOVE SURG SS PI 7.0 STRL IVOR (GLOVE) ×4 IMPLANT
GOWN STRL REIN XL XLG (GOWN DISPOSABLE) ×8 IMPLANT
K-WIRE 2.0 (WIRE) ×2
K-WIRE FX234X2XDRL TIP NS (WIRE) ×2
KIT BASIN OR (CUSTOM PROCEDURE TRAY) ×4 IMPLANT
KWIRE FX234X2XDRL TIP NS (WIRE) ×2 IMPLANT
MANIFOLD NEPTUNE II (INSTRUMENTS) IMPLANT
NS IRRIG 1000ML POUR BTL (IV SOLUTION) ×4 IMPLANT
PACK TOTAL JOINT (CUSTOM PROCEDURE TRAY) ×4 IMPLANT
PACK UNIVERSAL I (CUSTOM PROCEDURE TRAY) ×4 IMPLANT
PAD ABD 8X10 STRL (GAUZE/BANDAGES/DRESSINGS) ×4 IMPLANT
PAD CAST 4YDX4 CTTN HI CHSV (CAST SUPPLIES) ×4 IMPLANT
PADDING CAST COTTON 4X4 STRL (CAST SUPPLIES) ×4
PADDING CAST COTTON 6X4 STRL (CAST SUPPLIES) ×4 IMPLANT
PLATE DISTAL FEMUR 8H LEFT (Plate) ×3 IMPLANT
SCREW CANCELLOUS 6.0X70MM (Screw) ×3 IMPLANT
SCREW CORT 38X4.5XST LCK NS (Screw) ×2 IMPLANT
SCREW CORTICAL 4.5X34MM (Screw) ×8 IMPLANT
SCREW CORTICAL 4.5X38MM (Screw) ×2 IMPLANT
SCREW CORTICAL 4.5X40MM (Screw) ×8 IMPLANT
SCREW LOCK 60X5X NS LF (Screw) ×4 IMPLANT
SCREW LOCK 70X5XSTNS TI (Screw) ×2 IMPLANT
SCREW LOCKING 5.0MMX65MM (Screw) ×3 IMPLANT
SCREW LOCKING 5.0X60MM (Screw) ×4 IMPLANT
SCREW LOCKING 5.0X70MM (Screw) ×2 IMPLANT
SPONGE GAUZE 4X4 12PLY STER LF (GAUZE/BANDAGES/DRESSINGS) ×8 IMPLANT
STAPLER SKIN PROX WIDE 3.9 (STAPLE) ×4 IMPLANT
SUT VIC AB 0 CT1 27 (SUTURE) ×6
SUT VIC AB 0 CT1 27XBRD ANBCTR (SUTURE) ×2 IMPLANT
SUT VIC AB 0 CT1 27XBRD ANTBC (SUTURE) ×4 IMPLANT
SUT VIC AB 2-0 CT1 27 (SUTURE) ×4
SUT VIC AB 2-0 CT1 TAPERPNT 27 (SUTURE) ×4 IMPLANT
TOWEL OR 17X26 10 PK STRL BLUE (TOWEL DISPOSABLE) ×12 IMPLANT
WATER STERILE IRR 1000ML POUR (IV SOLUTION) ×4 IMPLANT

## 2015-11-20 NOTE — Consult Note (Signed)
ORTHOPAEDIC CONSULTATION  REQUESTING PHYSICIAN: Beverely Low, MD  Chief Complaint: Left femur fracture  HPI: IZZIE Richard is a 59 y.o. female who presents with left femur fracture s/p mechanical fall.  The patient endorses severe pain in the left knee, that does not radiate, grinding in quality, worse with any movement, better with immobilization.  Denies LOC/fever/chills/nausea/vomiting.  Walks without assistive devices (walker, cane, wheelchair).  Does live independently.  Past Medical History  Diagnosis Date  . GERD (gastroesophageal reflux disease)   . Psoriatic arthritis (HCC)     Dr. Zenovia Jordan  . Osteoarthritis    Past Surgical History  Procedure Laterality Date  . Cesarean section      x2  . Tonsillectomy and adenoidectomy  1960  . Hysteroscopy w/d&c  07    no polyps  . Gastric bypass  2002    lost 140 lb  . Cholecystectomy  1984  . Colonoscopy  2005    recheck in 5 years  . Tarsal metatarsal arthrodesis  11/28/2012    Procedure: TARSAL METATARSAL FUSION;  Surgeon: Sherri Rad, MD;  Location: [facility];  Service: Orthopedics;  Laterality: Right;  right 2nd and 3rd TMT joint fusion local bone graft stress x ray foot   . Ulnar tunnel release  09/2014  . Carpal tunnel release  09/2014  . Tee without cardioversion N/A 01/07/2015    Procedure: TRANSESOPHAGEAL ECHOCARDIOGRAM (TEE);  Surgeon: Lars Masson, MD;  Location: [facility] ENDOSCOPY;  Service: Cardiovascular;  Laterality: N/A;   Social History   Social History  . Marital Status: Widowed    Spouse Name: N/A  . Number of Children: 3  . Years of Education: N/A   Social History Main Topics  . Smoking status: Never Smoker   . Smokeless tobacco: Never Used  . Alcohol Use: 15.0 oz/week    25 Glasses of wine per week     Comment: occ  . Drug Use: No  . Sexual Activity: No     Comment: husband passed 06/2012   Other Topics Concern  . None   Social History Narrative   Family History    Problem Relation Age of Onset  . Cervical cancer Mother   . Hypertension Father   . Heart failure Father   . Hypertension Brother     also with hip replacements  . Hyperlipidemia Brother   . Alcohol abuse Paternal Uncle    Allergies  Allergen Reactions  . Methotrexate Derivatives Other (See Comments)    Made liver enzymes spike.   Prior to Admission medications   Medication Sig Start Date End Date Taking? Authorizing Provider  acetaminophen (TYLENOL) 500 MG tablet Take 1,000 mg by mouth 3 (three) times daily as needed for moderate pain.   Yes Historical Provider, MD  adalimumab (HUMIRA) 40 MG/0.8ML injection Inject 40 mg into the skin every 14 (fourteen) days.   Yes Historical Provider, MD  Calcium Carbonate-Vit D-Min (CALCIUM 1200 PO) Take 1 tablet by mouth 2 (two) times daily.   Yes Historical Provider, MD  cetirizine (ZYRTEC) 10 MG tablet Take 10 mg by mouth daily.   Yes Historical Provider, MD  cyanocobalamin (,VITAMIN B-12,) 1000 MCG/ML injection Inject 1 mL (1,000 mcg total) into the muscle every 30 (thirty) days. 11/03/14  Yes Ria Comment, FNP  escitalopram (LEXAPRO) 20 MG tablet Take 1 tablet (20 mg total) by mouth daily. 06/09/15 06/08/16 Yes Court Joy, PA-C  fluticasone (FLONASE) 50 MCG/ACT nasal spray Place 1-2 sprays into  both nostrils daily.   Yes Historical Provider, MD  MAGNESIUM PO Take 250 mg by mouth 2 (two) times daily.   Yes Historical Provider, MD  meloxicam (MOBIC) 15 MG tablet Take 7.5 mg by mouth 2 (two) times daily.    Yes Historical Provider, MD  mirtazapine (REMERON) 15 MG tablet Take 15 mg by mouth at bedtime.   Yes Historical Provider, MD  Multiple Vitamins-Minerals (MULTIVITAMIN WITH MINERALS) tablet Take 1 tablet by mouth daily.   Yes Historical Provider, MD  omeprazole (PRILOSEC) 20 MG capsule Take 20 mg by mouth 2 (two) times daily before a meal.   Yes Historical Provider, MD  POTASSIUM PO Take 250 mg by mouth 2 (two) times daily.   Yes Historical  Provider, MD  SUMAtriptan (IMITREX) 100 MG tablet May repeat in 2 hours if headache persists or recurs. Patient taking differently: Take 100 mg by mouth every 2 (two) hours as needed for migraine. May repeat in 2 hours if headache persists or recurs. 11/03/14  Yes Ria Comment, FNP  thiamine 50 MG tablet Take 50 mg by mouth 2 (two) times daily.   Yes Historical Provider, MD  vitamin C (ASCORBIC ACID) 500 MG tablet Take 500 mg by mouth 2 (two) times daily.   Yes Historical Provider, MD   Ct Knee Left Wo Contrast  11/19/2015  CLINICAL DATA:  59 year old female with fall and left knee pain EXAM: CT OF THE left KNEE WITHOUT CONTRAST TECHNIQUE: Multidetector CT imaging of the left knee was performed according to the standard protocol. Multiplanar CT image reconstructions were also generated. COMPARISON:  Radiograph dated 11/19/15 FINDINGS: The bones are osteopenic. There is a comminuted fracture of distal femoral metaphysis with extension of the fracture lines into the articular surface of the medial condyle. There is also extension of the fracture into the lateral epicondyle. There is posterior angulation of the distal fracture fragment at the distal femoral metaphysis. The visualized tibia and fibula appear unremarkable. There is anatomic alignment of the distal femur and proximal tibia. The patella appears unremarkable. There is suprapatellar hemarthrosis as well as diffuse soft tissues teratoma. IMPRESSION: Comminuted intra-articular fracture of the distal femoral metaphysis with posterior angulation of the distal fracture fragment. Electronically Signed   By: Elgie Collard [name]   On: 11/19/2015 21:07   Dg Chest Portable 1 View  11/19/2015  CLINICAL DATA:  59 year old female with preop evaluation EXAM: PORTABLE CHEST 1 VIEW COMPARISON:  Radiographs dated 07/07/2009 FINDINGS: Single-view of the chest does not demonstrate any focal consolidation, pleural effusion, or pneumothorax. There is mild diffuse  interstitial prominence. Cyst cardiac silhouette is within normal limits. The osseous structures are grossly unremarkable. Surgical clips noted gastric area. IMPRESSION: No active disease. Electronically Signed   By: Elgie Collard [name]   On: 11/19/2015 18:19   Dg Knee Complete 4 Views Left  11/19/2015  CLINICAL DATA:  Larey Seat while standing on a chair today, fell onto LEFT knee, generalized LEFT knee pain and swelling, unable to fully extend knee, initial encounter EXAM: LEFT KNEE - COMPLETE 4+ VIEW COMPARISON:  None FINDINGS: Diffuse osseous demineralization. Comminuted displaced distal metaphyseal fracture of the LEFT femur extending into the intercondylar notch. Posterior dislocation on of the distal condylar fragments with apex anterior and lateral angulation. Associated soft tissue swelling. Patella, tibia, and fibula appear grossly intact. IMPRESSION: Comminuted displaced and angulated intra-articular distal LEFT femoral metaphyseal fracture as above. Electronically Signed   By: Ulyses Southward [name]   On: 11/19/2015 17:37  Dg C-arm 1-60 Min-no Report  11/20/2015  CLINICAL DATA: fractured left femur C-ARM 1-60 MINUTES Fluoroscopy was utilized by the requesting physician.  No radiographic interpretation.   Dg Femur Min 2 Views Left  11/20/2015  CLINICAL DATA:  Status post external fixation of left distal femoral fracture. Initial encounter. EXAM: LEFT FEMUR 2 VIEWS COMPARISON:  Left knee radiographs performed 11/19/2015 FINDINGS: Two fluoroscopic C-arm images are provided from the OR, demonstrating the comminuted distal femoral fracture in improved alignment, though there is mild residual displacement of fragments, particularly posteriorly. Associated external fixation hardware is not characterized on this study. Underlying marginal osteophytes are noted at the medial compartment. IMPRESSION: Improved alignment of comminuted distal femoral fracture, though there is mild residual displacement of  fragments. Electronically Signed   By: Roanna Raider [name]   On: 11/20/2015 01:06    Positive ROS: All other systems have been reviewed and were otherwise negative with the exception of those mentioned in the HPI and as above.  Physical Exam: General: Alert, no acute distress Cardiovascular: No pedal edema Respiratory: No cyanosis, no use of accessory musculature GI: No organomegaly, abdomen is soft and non-tender Skin: No lesions in the area of chief complaint Neurologic: Sensation intact distally Psychiatric: Patient is competent for consent with normal mood and affect Lymphatic: No axillary or cervical lymphadenopathy  MUSCULOSKELETAL:  - severe pain with movement of the extremity - skin intact - NVI distally - compartments soft  Assessment: Left supracondylar femur fracture  Plan: - IM nail is recommended, patient and family are aware of r/b/a and wish to proceed - consent obtained - surgery is planned for today  Thank you for the consult and the opportunity to see Ms. Waterson  N. Glee Arvin, MD [facility] [phone] 7:40 AM

## 2015-11-20 NOTE — Anesthesia Postprocedure Evaluation (Signed)
Anesthesia Post Note  Patient: [facility]  Procedure(s) Performed: Procedure(s) (LRB): OPEN REDUCTION INTERNAL FIXATION (ORIF) DISTAL FEMUR FRACTURE (Left)  Patient location during evaluation: PACU Anesthesia Type: General Level of consciousness: awake and alert Pain management: pain level controlled Vital Signs Assessment: post-procedure vital signs reviewed and stable Respiratory status: spontaneous breathing, nonlabored ventilation, respiratory function stable and patient connected to nasal cannula oxygen Cardiovascular status: blood pressure returned to baseline and stable Postop Assessment: no signs of nausea or vomiting Anesthetic complications: no    Last Vitals:  Filed Vitals:   11/20/15 1315 11/20/15 1330  BP: 93/67 87/53  Pulse: 98 81  Temp:    Resp: 13 10    Last Pain:  Filed Vitals:   11/20/15 1344  PainSc: Asleep                 Laurena Valko A

## 2015-11-20 NOTE — Anesthesia Procedure Notes (Signed)
Procedure Name: LMA Insertion Date/Time: 11/20/2015 10:05 AM Performed by: Gwenyth Allegra Pre-anesthesia Checklist: Patient identified, Emergency Drugs available, Suction available, Patient being monitored and Timeout performed Patient Re-evaluated:Patient Re-evaluated prior to inductionOxygen Delivery Method: Circle system utilized Preoxygenation: Pre-oxygenation with 100% oxygen Intubation Type: IV induction Ventilation: Mask ventilation without difficulty LMA: LMA inserted LMA Size: 4.0 Placement Confirmation: positive ETCO2 and breath sounds checked- equal and bilateral Tube secured with: Tape Dental Injury: Teeth and Oropharynx as per pre-operative assessment

## 2015-11-20 NOTE — Anesthesia Preprocedure Evaluation (Signed)
Anesthesia Evaluation  Patient identified by MRN, date of birth, ID band Patient awake    Reviewed: Allergy & Precautions, NPO status , Patient's Chart, lab work & pertinent test results  Airway Mallampati: I  TM Distance: >3 FB Neck ROM: Full    Dental  (+) Teeth Intact, Dental Advisory Given   Pulmonary    breath sounds clear to auscultation       Cardiovascular  Rhythm:Regular Rate:Normal + Diastolic murmurs TEE from 2/16 shows only trivial aortic valve regurgitation   Neuro/Psych    GI/Hepatic GERD  Medicated and Controlled,  Endo/Other    Renal/GU      Musculoskeletal   Abdominal   Peds  Hematology   Anesthesia Other Findings   Reproductive/Obstetrics                             Anesthesia Physical Anesthesia Plan  ASA: II  Anesthesia Plan: General   Post-op Pain Management:    Induction: Intravenous  Airway Management Planned: LMA  Additional Equipment:   Intra-op Plan:   Post-operative Plan: Extubation in OR  Informed Consent: I have reviewed the patients History and Physical, chart, labs and discussed the procedure including the risks, benefits and alternatives for the proposed anesthesia with the patient or authorized representative who has indicated his/her understanding and acceptance.   Dental advisory given  Plan Discussed with: CRNA, Anesthesiologist and Surgeon  Anesthesia Plan Comments:         Anesthesia Quick Evaluation

## 2015-11-20 NOTE — Progress Notes (Signed)
Orthopedic Tech Progress Note Patient Details:  Jasmine Richard 29-Oct-1956 [id number]  Ortho Devices Type of Ortho Device: Knee Immobilizer Ortho Device/Splint Location: trapeze bar patient helper Ortho Device/Splint Interventions: Application   Nikki Dom 11/20/2015, 4:15 PM

## 2015-11-20 NOTE — Op Note (Signed)
NAMEALLEA, KASSNER              ACCOUNT NO.:  [id number]  MEDICAL RECORD NO.:  [id number]  LOCATION:  [facility]                         FACILITY:  [facility]  PHYSICIAN:  Almedia Balls. Ranell Patrick, [name] DATE OF BIRTH:  03-Sep-1956  DATE OF PROCEDURE:  11/19/2015 DATE OF DISCHARGE:                              OPERATIVE REPORT   PREOPERATIVE DIAGNOSIS:  Left displaced and comminuted distal femur fracture.  POSTOPERATIVE DIAGNOSIS:  Left displaced and comminuted distal femur fracture.  PROCEDURE PERFORMED:  Closed reduction, application of knee immobilizer.  ATTENDING SURGEON:  Almedia Balls. Ranell Patrick, MD.  ASSISTANT:  None.  ANESTHESIA:  General anesthesia was used.  ESTIMATED BLOOD LOSS:  None.  FLUID REPLACEMENT:  1000 mL crystalloid.  INSTRUMENT COUNTS:  Correct.  COMPLICATIONS:  There were no complications.  ANTIBIOTICS:  Not given because they were not indicated.  INDICATIONS:  The patient is a 59 year old female who suffered a fall off a chair injuring her distal femur on the left side.  She presented with a shortened hyperflexed distal femur fracture very distal.  CT scan showing intercondylar split and extensive comminution.  We counseled the patient regarding the need to reduce her knee tonight to ensure adequate perfusion inflow and to temporize this injury as she is going to require further surgery and likely at [facility] and also by different surgeon.  The patient agreed.  Informed consent obtained.  DESCRIPTION OF PROCEDURE:  After an adequate level of general anesthesia was achieved, just with manual traction I was able to close reduce the fracture under fluoro.  AP and lateral views demonstrating appropriate fracture alignment.  The length was satisfactory and knee immobilizer was applied.  The patient was awakened and transferred in stable condition to recovery room.     Almedia Balls. Ranell Patrick, [name]     SRN/MEDQ  D:  11/20/2015  T:  11/20/2015  Job:  [id number]

## 2015-11-20 NOTE — Transfer of Care (Signed)
Immediate Anesthesia Transfer of Care Note  Patient: Jasmine Richard  Procedure(s) Performed: Procedure(s): CLOSED REDUCTION LEFT DISTAL FEMUR (Left)  Patient Location: PACU  Anesthesia Type:General  Level of Consciousness:  sedated, patient cooperative and responds to stimulation  Airway & Oxygen Therapy:Patient Spontanous Breathing and Patient connected to face mask oxgen  Post-op Assessment:  Report given to PACU RN and Post -op Vital signs reviewed and stable  Post vital signs:  Reviewed and stable  Last Vitals:  Filed Vitals:   11/19/15 2215 11/19/15 2300  BP:  127/62  Pulse: 92 75  Temp:    Resp: 16 16    Complications: No apparent anesthesia complications

## 2015-11-20 NOTE — Progress Notes (Signed)
Orthopedic Tech Progress Note Patient Details:  [name] 06-20-1956 [id number] Brace order completed by bio-tech vendor. Patient ID: [name], female   DOB: 03-Nov-1956, 59 y.o.   MRN: [id number]   Jennye Moccasin 11/20/2015, 6:48 PM

## 2015-11-20 NOTE — Brief Op Note (Signed)
11/19/2015 - 11/20/2015  12:20 AM  PATIENT:  Jasmine Richard  59 y.o. female  PRE-OPERATIVE DIAGNOSIS:  left distal femur fracture, comminuted and displaced  POST-OPERATIVE DIAGNOSIS:  left distal femur fracture, comminuted and displaced  PROCEDURE:  Procedure(s): CLOSED REDUCTION LEFT DISTAL FEMUR (Left) application of knee immobilizer  SURGEON:  Surgeon(s) and Role:    * Beverely Low, MD - Primary  PHYSICIAN ASSISTANT:   ASSISTANTS: none   ANESTHESIA:   general  EBL:  Total I/O In: 600 [I.V.:600] Out: -   BLOOD ADMINISTERED:none  DRAINS: none   LOCAL MEDICATIONS USED:  NONE  SPECIMEN:  No Specimen  DISPOSITION OF SPECIMEN:  N/A  COUNTS:  YES  TOURNIQUET:  * No tourniquets in log *  DICTATION: .Other Dictation: Dictation Number [id number]  PLAN OF CARE: Admit to inpatient   PATIENT DISPOSITION:  PACU - hemodynamically stable.   Delay start of Pharmacological VTE agent (>24hrs) due to surgical blood loss or risk of bleeding: not applicable

## 2015-11-20 NOTE — Op Note (Signed)
   Date of Surgery: 11/20/2015  INDICATIONS: Jasmine Richard is a 59 y.o.-year-old female with a displaced left supracondylar femur fracture;  The patient did consent to the procedure after discussion of the risks and benefits.  PREOPERATIVE DIAGNOSIS: Left supracondylar femur fracture with intercondylar extension  POSTOPERATIVE DIAGNOSIS: Same.  PROCEDURE: ORIF of left supracondylar femur fracture with intercondylar extension  SURGEON: N. Glee Arvin, [name]  ASSIST: none.  ANESTHESIA:  general  IV FLUIDS AND URINE: See anesthesia.  ESTIMATED BLOOD LOSS: 200 mL.  IMPLANTS: Stryker distal femur plate  DRAINS: none  COMPLICATIONS: None.  DESCRIPTION OF PROCEDURE: The patient was brought to the operating room and placed supine on the operating table.  The patient had been signed prior to the procedure and this was documented. The patient had the anesthesia placed by the anesthesiologist.  A time-out was performed to confirm that this was the correct patient, site, side and location. The patient did receive antibiotics prior to the incision and was re-dosed during the procedure as needed at indicated intervals.  A tourniquet not placed.  The patient had the operative extremity prepped and draped in the standard surgical fashion.    We made a lateral incision over the distal femur and full-thickness flaps were created down onto the IT band. IT band was sharply incised in line with the incision. The distal femur was exposed. We took care not to violate too much of the blood supply to the fracture site. We then slid a Cobb up the lateral aspect of the femur to created a pocket for the plate. We then found the appropriate length for the plate and placed it at the appropriate place on the femur. This was confirmed with fluoroscopy. A K wire was placed through the center of the distal cluster parallel to the joint. We then placed a cancellus nonlocking screw in order to contour the plate down to the  lateral femoral condyle. We then placed several other locking screws to create a fixed angle construct. We then placed a nonlocking screw at the apex of the fracture to contour the plate down to the shaft. We then placed 3 additional nonlocking screws into the plate in order to maximize working length. The proximal 2 screws were placed through a counter incision. This was done under fluoroscopy. Final x-rays were taken. Wounds were irrigated. The wound was closed in layer fashion using 0 Vicryl, 2-0 Vicryl, staples. Sterile dressings were applied. Patient tolerated the procedure well and had no immediate complications.   POSTOPERATIVE PLAN: Non weight bearing left lower extremity.  Lovenox.  Will likely need SNF.  Jasmine Reel, MD [facility] [phone] 8:40 AM

## 2015-11-20 NOTE — Anesthesia Postprocedure Evaluation (Signed)
Anesthesia Post Note  Patient: THERESEA TRAUTMANN  Procedure(s) Performed: Procedure(s) (LRB): CLOSED REDUCTION LEFT DISTAL FEMUR (Left)  Patient location during evaluation: PACU Anesthesia Type: General Level of consciousness: awake and alert Pain management: pain level controlled Vital Signs Assessment: post-procedure vital signs reviewed and stable Respiratory status: spontaneous breathing, nonlabored ventilation, respiratory function stable and patient connected to nasal cannula oxygen Cardiovascular status: blood pressure returned to baseline and stable Postop Assessment: no signs of nausea or vomiting Anesthetic complications: no    Last Vitals:  Filed Vitals:   11/20/15 0533 11/20/15 0800  BP: 102/61 100/55  Pulse: 68 68  Temp: 36.7 C 37.2 C  Resp: 18 18    Last Pain:  Filed Vitals:   11/20/15 0828  PainSc: 5                  Kemarion Abbey J

## 2015-11-20 NOTE — Progress Notes (Signed)
Orthopedic Tech Progress Note Patient Details:  Jasmine Richard September 18, 1956 [id number]  Patient ID: Jasmine Richard, female   DOB: [birth year], 59 y.o.   MRN: [id number] Called in bio-tech brace order; spoke with Jasmine Richard, Jasmine Richard 11/20/2015, 4:14 PM

## 2015-11-20 NOTE — Transfer of Care (Signed)
Immediate Anesthesia Transfer of Care Note  Patient: Jasmine Richard  Procedure(s) Performed: Procedure(s): OPEN REDUCTION INTERNAL FIXATION (ORIF) DISTAL FEMUR FRACTURE (Left)  Patient Location: PACU  Anesthesia Type:General  Level of Consciousness: awake, alert  and oriented  Airway & Oxygen Therapy: Patient Spontanous Breathing and Patient connected to nasal cannula oxygen  Post-op Assessment: Report given to RN and Post -op Vital signs reviewed and stable  Post vital signs: Reviewed and stable  Last Vitals:  Filed Vitals:   11/20/15 0533 11/20/15 0800  BP: 102/61 100/55  Pulse: 68 68  Temp: 36.7 C 37.2 C  Resp: 18 18    Complications: No apparent anesthesia complications

## 2015-11-21 LAB — CBC
HEMATOCRIT: 23.1 % — AB (ref 36.0–46.0)
HEMOGLOBIN: 7.8 g/dL — AB (ref 12.0–15.0)
MCH: 34.1 pg — AB (ref 26.0–34.0)
MCHC: 33.8 g/dL (ref 30.0–36.0)
MCV: 100.9 fL — AB (ref 78.0–100.0)
Platelets: 129 10*3/uL — ABNORMAL LOW (ref 150–400)
RBC: 2.29 MIL/uL — AB (ref 3.87–5.11)
RDW: 13.3 % (ref 11.5–15.5)
WBC: 7.7 10*3/uL (ref 4.0–10.5)

## 2015-11-21 LAB — ABO/RH: ABO/RH(D): O POS

## 2015-11-21 LAB — PREPARE RBC (CROSSMATCH)

## 2015-11-21 MED ORDER — SODIUM CHLORIDE 0.9 % IV BOLUS (SEPSIS)
1000.0000 mL | Freq: Once | INTRAVENOUS | Status: AC
  Administered 2015-11-21: 1000 mL via INTRAVENOUS

## 2015-11-21 MED ORDER — SODIUM CHLORIDE 0.9 % IV SOLN: Freq: Once | INTRAVENOUS | Status: DC

## 2015-11-21 NOTE — Evaluation (Signed)
Physical Therapy Evaluation Patient Details Name: Jasmine Richard MRN: [id number] DOB: Feb 26, 1956 Today's Date: 11/21/2015   History of Present Illness  59 y.o. female with fall suffering left femur fracture s/p OPEN REDUCTION INTERNAL FIXATION (ORIF) DISTAL FEMUR FRACTURE (Left).  Clinical Impression  Patient is s/p above surgery presenting with functional limitations due to the deficits listed below (see PT Problem List). Requires min assist for bed mobility and transfers. Able to ambulate short distance while maintaining NWB on LLE using a rolling walker. Lives alone and would greatly benefit from ST SNF for rehab prior to returning home at a mod I level for mobility and ADLs. Patient will benefit from skilled PT to increase their independence and safety with mobility to allow discharge to the venue listed below.       Follow Up Recommendations SNF    Equipment Recommendations  Rolling walker with 5" wheels    Recommendations for Other Services       Precautions / Restrictions Precautions Precautions: Fall Required Braces or Orthoses: Other Brace/Splint Other Brace/Splint: Bledsoe brace OOB on LT Restrictions Weight Bearing Restrictions: Yes LLE Weight Bearing: Non weight bearing      Mobility  Bed Mobility Overal bed mobility: Needs Assistance Bed Mobility: Supine to Sit     Supine to sit: Min assist     General bed mobility comments: Min assist for LLE support and for patient to pull self up through PTs hand. Cues for technique.  Transfers Overall transfer level: Needs assistance Equipment used: Rolling walker (2 wheeled) Transfers: Sit to/from [employer] Sit to Stand: Min assist Stand pivot transfers: Min assist       General transfer comment: Min assist for boost to stand and pivot with support for balance. Maintains NWB on LLE. VC for technique. Denies dizziness.  Ambulation/Gait Ambulation/Gait assistance: Min guard Ambulation  Distance (Feet): 10 Feet Assistive device: Rolling walker (2 wheeled) Gait Pattern/deviations:  ("hop-to" pattern) Gait velocity: decreased Gait velocity interpretation: Below normal speed for age/gender General Gait Details: Educated on safe DME use with a rolling walker for support. VC for NWB precautions on Lt which she maintained nicely. Able to take small hops with close guard for safety and VC for walker placement and technique. No overt loss of balance. Fatigues quickly.  Stairs            Wheelchair Mobility    Modified Rankin (Stroke Patients Only)       Balance Overall balance assessment: Needs assistance Sitting-balance support: No upper extremity supported;Feet supported Sitting balance-Leahy Scale: Good     Standing balance support: Bilateral upper extremity supported Standing balance-Leahy Scale: Poor                               Pertinent Vitals/Pain Pain Assessment: 0-10 Pain Score: 7  Pain Location: LLE Pain Descriptors / Indicators: Aching Pain Intervention(s): Monitored during session;Repositioned    Home Living Family/patient expects to be discharged to:: Private residence Living Arrangements: Alone Available Help at Discharge: Family;Friend(s);Available PRN/intermittently Type of Home: House Home Access: Stairs to enter Entrance Stairs-Rails: Right;Left Entrance Stairs-Number of Steps: 8 Home Layout: Multi-level;Laundry or work area in Theatre manager on Nature conservation officer: Crutches;Shower seat      Prior Function Level of Independence: Independent               Hand Dominance   Dominant Hand: Right    Extremity/Trunk Assessment   Upper  Extremity Assessment: Defer to OT evaluation           Lower Extremity Assessment: LLE deficits/detail   LLE Deficits / Details: Able to volitionally move ankles and toes. Pt in bledsoe brace.     Communication   Communication: No difficulties  Cognition  Arousal/Alertness: Awake/alert Behavior During Therapy: WFL for tasks assessed/performed Overall Cognitive Status: Within Functional Limits for tasks assessed                      General Comments General comments (skin integrity, edema, etc.): Educated on bledsoe brace to be worn OOB.    Exercises General Exercises - Lower Extremity Ankle Circles/Pumps: AROM;Both;10 reps;Supine Quad Sets: Strengthening;Both;10 reps;Seated      Assessment/Plan    PT Assessment Patient needs continued PT services  PT Diagnosis Difficulty walking;Abnormality of gait;Acute pain   PT Problem List Decreased strength;Decreased range of motion;Decreased activity tolerance;Decreased balance;Decreased mobility;Decreased knowledge of use of DME;Pain  PT Treatment Interventions DME instruction;Gait training;Functional mobility training;Therapeutic activities;Therapeutic exercise;Balance training;Neuromuscular re-education;Patient/family education;Modalities   PT Goals (Current goals can be found in the Care Plan section) Acute Rehab PT Goals Patient Stated Goal: Get rehab before going home PT Goal Formulation: With patient Time For Goal Achievement: 12/05/15 Potential to Achieve Goals: Good    Frequency Min 3X/week   Barriers to discharge Decreased caregiver support lives alone    Co-evaluation               End of Session Equipment Utilized During Treatment: Gait belt;Left knee immobilizer Activity Tolerance: Patient limited by fatigue;Patient limited by pain Patient left: in chair;with call bell/phone within reach;with SCD's reapplied Nurse Communication: Mobility status;Weight bearing status         Time: 9935-7017 PT Time Calculation (min) (ACUTE ONLY): 21 min   Charges:   PT Evaluation $Initial PT Evaluation Tier I: 1 Procedure     PT G CodesBerton Mount 11/21/2015, 10:15 AM Charlsie Merles, PT [phone]

## 2015-11-21 NOTE — Progress Notes (Signed)
Pts BP 80/54 manually, MD on call notified. Received orders to administer a 1L NS bolus and recheck BP at that time. Orders carried out, pts BP improved to 96/50 manually. Nursing will continue to monitor.

## 2015-11-21 NOTE — Progress Notes (Signed)
   Subjective:  Patient reports pain as mild.    Objective:   VITALS:   Filed Vitals:   11/20/15 2356 11/21/15 0022 11/21/15 0220 11/21/15 0529  BP: 87/55 80/54 96/50  93/49  Pulse: 83   81  Temp: 98.9 F (37.2 C)   99.7 F (37.6 C)  TempSrc: Oral   Oral  Resp: 16   16  Height:      Weight:      SpO2: 97%   99%    Neurologically intact Neurovascular intact Sensation intact distally Intact pulses distally Dorsiflexion/Plantar flexion intact Incision: dressing C/D/I and no drainage No cellulitis present Compartment soft   Lab Results  Component Value Date   WBC 7.7 11/21/2015   HGB 7.8* 11/21/2015   HCT 23.1* 11/21/2015   MCV 100.9* 11/21/2015   PLT 129* 11/21/2015     Assessment/Plan:  1 Day Post-Op   - Expected postop acute blood loss anemia - will monitor for symptoms - Up with PT/OT - likely needs SNF - DVT ppx - SCDs, ambulation, lovenox - NWB operative extremity - bledsoe brace when OOB, she may keep brace on at all times if she feels inclined - Pain controlled - Discharge planning  11/23/2015 11/21/2015, 7:13 AM [phone]

## 2015-11-22 LAB — TYPE AND SCREEN
ABO/RH(D): O POS
Antibody Screen: NEGATIVE
UNIT DIVISION: 0

## 2015-11-22 NOTE — Progress Notes (Signed)
Physical Therapy Treatment Patient Details Name: Jasmine DONALSON MRN: [id number] DOB: [birth year] Today's Date: 11/22/2015    History of Present Illness 59 y.o. female with fall suffering left femur fracture s/p OPEN REDUCTION INTERNAL FIXATION (ORIF) DISTAL FEMUR FRACTURE (Left).    PT Comments    Noting good progress with activity tolerance; We discussed pt's goals of being able to return to the [facility] to continue her work to maintain sobriety -- I believe she must be independent to return to the [facility]; I also think to return to the [place] at an independent wheelchair level is doable, and Ms. Linam seemed encouraged by this; continue to recommend SNF fo rpsot-acute rehab to maximize independence and safety with mobility prior to return to [facility]  Follow Up Recommendations  SNF     Equipment Recommendations  Rolling walker with 5" wheels    Recommendations for Other Services       Precautions / Restrictions Precautions Precautions: Fall Required Braces or Orthoses: Other Brace/Splint Other Brace/Splint: Bledsoe brace OOB on LT Restrictions LLE Weight Bearing: Non weight bearing    Mobility  Bed Mobility Overal bed mobility: Needs Assistance Bed Mobility: Supine to Sit     Supine to sit: Min assist     General bed mobility comments: to help mobilize LLE  Transfers Overall transfer level: Needs assistance Equipment used: Rolling walker (2 wheeled) Transfers: Sit to/from Stand Sit to Stand: Min assist         General transfer comment: min A to power up. vc for hand placement  Ambulation/Gait Ambulation/Gait assistance: Min guard;+2 safety/equipment Ambulation Distance (Feet): 25 Feet Assistive device: Rolling walker (2 wheeled) Gait Pattern/deviations:  (hop-to)     General Gait Details: Verbal and demo cues to press body weight into RW and minimize "hopping' which can be quite energy taxing; Noted good, smooth steps, occasional  touching down L foot   Stairs            Wheelchair Mobility    Modified Rankin (Stroke Patients Only)       Balance             Standing balance-Leahy Scale: Fair (on one foot)                      Cognition Arousal/Alertness: Awake/alert Behavior During Therapy: WFL for tasks assessed/performed Overall Cognitive Status: Within Functional Limits for tasks assessed                      Exercises      General Comments        Pertinent Vitals/Pain Pain Assessment: 0-10 Pain Score: 7  Pain Location: LLE Pain Descriptors / Indicators: Aching Pain Intervention(s): Limited activity within patient's tolerance;Monitored during session;Repositioned    Home Living                      Prior Function            PT Goals (current goals can now be found in the care plan section) Acute Rehab PT Goals Patient Stated Goal: Get rehab before going home PT Goal Formulation: With patient Time For Goal Achievement: 12/05/15 Potential to Achieve Goals: Good Progress towards PT goals: Progressing toward goals    Frequency  Min 3X/week    PT Plan Current plan remains appropriate    Co-evaluation             End of Session Equipment  Utilized During Treatment: Gait belt Medical laboratory scientific officer) Activity Tolerance: Patient tolerated treatment well Patient left: in chair;with call bell/phone within reach     Time: 9373-4287 PT Time Calculation (min) (ACUTE ONLY): 26 min  Charges:  $Gait Training: 23-37 mins                    G Codes:      Olen Pel 11/22/2015, 4:19 PM  Van Clines, [place]  Acute Rehabilitation Services Pager [phone] Office [phone]

## 2015-11-22 NOTE — Clinical Social Work Note (Signed)
CSW received referral CSW to work on trying to find SNF placement for patient, formal assessment to follow.  Jasmine Richard. Nicole Hafley, MSW, Theresia Majors [phone] 11/22/2015 8:23 PM

## 2015-11-22 NOTE — Progress Notes (Signed)
Occupational Therapy Evaluation Patient Details Name: Jasmine Richard MRN: [id number] DOB: 1956/02/19 Today's Date: 11/22/2015    History of Present Illness 59 y.o. female with fall suffering left femur fracture s/p OPEN REDUCTION INTERNAL FIXATION (ORIF) DISTAL FEMUR FRACTURE (Left).   Clinical Impression   PTA, pt living at [facility]. Pt with limited support at home and requires min A with mobility and Mod A with ADL. Pt will benefit from rehab at SNF to maximize functional level of independence to facilitate a safe return home. Will follow acutely to address established goals.     Follow Up Recommendations  SNF;Supervision/Assistance - 24 hour    Equipment Recommendations  3 in 1 bedside comode    Recommendations for Other Services       Precautions / Restrictions Precautions Precautions: Fall Required Braces or Orthoses: Other Brace/Splint Other Brace/Splint: Bledsoe brace OOB on LT Restrictions Weight Bearing Restrictions: Yes LLE Weight Bearing: Non weight bearing      Mobility Bed Mobility Overal bed mobility: Needs Assistance Bed Mobility: Supine to Sit     Supine to sit: Min assist     General bed mobility comments: to help mobilize LLE  Transfers Overall transfer level: Needs assistance Equipment used: Rolling walker (2 wheeled)   Sit to Stand: Min assist Stand pivot transfers: Min assist       General transfer comment: min A to power up. vc for hand placement    Balance     Sitting balance-Leahy Scale: Good       Standing balance-Leahy Scale: Fair                              ADL Overall ADL's : Needs assistance/impaired     Grooming: Set up   Upper Body Bathing: Set up   Lower Body Bathing: Moderate assistance;Sit to/from stand   Upper Body Dressing : Set up   Lower Body Dressing: Moderate assistance;Sit to/from stand   Toilet Transfer: Minimal assistance;BSC;Ambulation;RW (over toilet)   Toileting-  Clothing Manipulation and Hygiene: Minimal assistance;Sit to/from stand       Functional mobility during ADLs: Minimal assistance;Rolling walker;Cueing for safety  Min vc to maintain NWB status. Pt discussed being at [facility] for rehab due to "self medicating with alcohol". Pt discussed how rehab has helped her get better and begin grieving her husband's death.                        Pertinent Vitals/Pain Pain Assessment: 0-10 Pain Score: 8  Pain Location: LLE Pain Descriptors / Indicators: Aching Pain Intervention(s): Limited activity within patient's tolerance;Repositioned;Ice applied     Hand Dominance Right   Extremity/Trunk Assessment Upper Extremity Assessment Upper Extremity Assessment: Overall WFL for tasks assessed   Lower Extremity Assessment Lower Extremity Assessment: Defer to PT evaluation LLE Deficits / Details: Able to volitionally move ankles and toes. Pt in bledsoe brace. LLE: Unable to fully assess due to immobilization;Unable to fully assess due to pain   Cervical / Trunk Assessment Cervical / Trunk Assessment: Normal   Communication Communication Communication: No difficulties   Cognition Arousal/Alertness: Awake/alert Behavior During Therapy: WFL for tasks assessed/performed Overall Cognitive Status: Within Functional Limits for tasks assessed                     General Comments       Exercises       Shoulder Instructions  Home Living Family/patient expects to be discharged to:: Private residence Living Arrangements: Alone Available Help at Discharge: Family;Friend(s);Available PRN/intermittently Type of Home: House Home Access: Stairs to enter [employer] of Steps: 8 Entrance Stairs-Rails: Right;Left Home Layout: Multi-level;Laundry or work area in Theatre manager on main level Engineer, maintenance (IT) of Steps: 12 Alternate Level Stairs-Rails: Left           Home Equipment:  Crutches;Shower seat   Additional Comments: Was at [facility] PTA fro rehab      Prior Functioning/Environment Level of Independence: Independent             OT Diagnosis: Generalized weakness;Acute pain   OT Problem List: Decreased strength;Decreased range of motion;Decreased activity tolerance;Impaired balance (sitting and/or standing);Decreased safety awareness;Decreased knowledge of use of DME or AE;Decreased knowledge of precautions;Obesity;Pain   OT Treatment/Interventions: Self-care/ADL training;Therapeutic activities;Therapeutic exercise;Patient/family education;Balance training    OT Goals(Current goals can be found in the care plan section) Acute Rehab OT Goals Patient Stated Goal: Get rehab before going home OT Goal Formulation: With patient Time For Goal Achievement: 11/30/14 Potential to Achieve Goals: Good ADL Goals Pt Will Perform Lower Body Bathing: with set-up;with adaptive equipment;sit to/from stand Pt Will Perform Lower Body Dressing: with set-up;with adaptive equipment;sit to/from stand Pt Will Transfer to Toilet: with supervision;ambulating;bedside commode Pt Will Perform Toileting - Clothing Manipulation and hygiene: with supervision;sit to/from stand Additional ADL Goal #1: Bed mobility for ADL  with S  OT Frequency: Min 2X/week   Barriers to D/C:            Co-evaluation              End of Session Equipment Utilized During Treatment: Gait belt;Rolling walker;Other (comment) (L Bledsoe brace) Nurse Communication: Mobility status;Weight bearing status;Precautions  Activity Tolerance: Patient tolerated treatment well Patient left: in chair;with call bell/phone within reach   Time: 0750-0820 OT Time Calculation (min): 30 min Charges:  OT General Charges $OT Visit: 1 Procedure OT Evaluation $Initial OT Evaluation Tier I: 1 Procedure OT Treatments $Self Care/Home Management : 8-22 mins G-Codes:    Breah Joa,HILLARY 11-Dec-2015, 9:15  AM   Luisa Dago, OTR/L  [phone] 12/11/15

## 2015-11-22 NOTE — Progress Notes (Signed)
     Subjective: 2 Days Post-Op Procedure(s) (LRB): OPEN REDUCTION INTERNAL FIXATION (ORIF) DISTAL FEMUR FRACTURE (Left) Awake, alert and oriented x 4. [facility], alcohol use. Larey Seat last Friday and Sustained left supracondylar femur fracture. Patient reports pain as moderate.    Objective:   VITALS:  Temp:  [99.2 F (37.3 C)-100.2 F (37.9 C)] 99.5 F (37.5 C) (12/26 0549) Pulse Rate:  [72-85] 79 (12/26 0549) Resp:  [15-18] 18 (12/26 0549) BP: (87-107)/(51-54) 96/53 mmHg (12/26 0549) SpO2:  [92 %-99 %] 92 % (12/26 0549)  Neurologically intact ABD soft Neurovascular intact Sensation intact distally Intact pulses distally Dorsiflexion/Plantar flexion intact Incision: dressing C/D/I   LABS  Recent Labs  11/19/15 1833 11/19/15 1840 11/20/15 1600 11/21/15 0241  HGB 12.7 13.6 9.6* 7.8*  WBC 7.1  --  12.2* 7.7  PLT 229  --  160 129*    Recent Labs  11/19/15 1840 11/20/15 1600  NA 142  --   K 4.5  --   CL 103  --   BUN 14  --   CREATININE 0.50 0.77  GLUCOSE 115*  --    No results for input(s): LABPT, INR in the last 72 hours.   Assessment/Plan: 2 Days Post-Op Procedure(s) (LRB): OPEN REDUCTION INTERNAL FIXATION (ORIF) DISTAL FEMUR FRACTURE (Left)  Advance diet Up with therapy D/C IV fluids Discharge to SNF  Jasmine Richard 11/22/2015, 11:29 AM

## 2015-11-23 ENCOUNTER — Encounter (HOSPITAL_COMMUNITY): Payer: Self-pay | Admitting: Orthopedic Surgery

## 2015-11-23 NOTE — Discharge Instructions (Signed)
° ° °  1. Change dressings at 1 week postop and replace with sterile gauze or ABD pad with light paper tape 2. May shower but keep incisions covered and dry 3. Take lovenox to prevent blood clots 4. Take stool softeners as needed 5. Take pain meds as needed 6. Wear brace when out of bed
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
## 2015-11-23 NOTE — Clinical Social Work Placement (Addendum)
   CLINICAL SOCIAL WORK PLACEMENT  NOTE  Date:  11/23/2015  Patient Details  Name: Jasmine Richard MRN: [id number] Date of Birth: [birth year]  Clinical Social Work is seeking post-discharge placement for this patient at the Skilled  Nursing Facility level of care (*CSW will initial, date and re-position this form in  chart as items are completed):  Yes   Patient/family provided with [facility] Clinical Social Work Department's list of facilities offering this level of care within the geographic area requested by the patient (or if unable, by the patient's family).  Yes   Patient/family informed of their freedom to choose among providers that offer the needed level of care, that participate in Medicare, Medicaid or managed care program needed by the patient, have an available bed and are willing to accept the patient.  Yes   Patient/family informed of [facility]'s ownership interest in [facility] and [facility], as well as of the fact that they are under no obligation to receive care at these facilities.  PASRR submitted to EDS on 11/23/15     PASRR number received on 11/23/15     Existing PASRR number confirmed on       FL2 transmitted to all facilities in geographic area requested by pt/family on 11/23/15     FL2 transmitted to all facilities within larger geographic area on 11/23/15     Patient informed that his/her managed care company has contracts with or will negotiate with certain facilities, including the following:         11/24/15   Patient/family informed of bed offers received. Windell Moulding, MSW, [place], Updated 11/24/15)  Patient chooses bed at  [facility] (Windell Moulding, MSW, [place], Updated 11/24/15)     Physician recommends and patient chooses bed at      Patient to be transferred to  [facility] on  11/24/15 Windell Moulding, MSW, [place], Updated 11/24/15).  Patient to be transferred to facility by  Patient's son's vehicle Windell Moulding, MSW,  [place], Updated 11/24/15)    Patient family notified on  11/24/15 of transfer.Windell Moulding, MSW, [place], Updated 11/24/15)  Name of family member notified:   Patient to notify her son Windell Moulding, MSW, [place], Updated 11/24/15)    PHYSICIAN Please sign FL2     Additional Comment:    _______________________________________________ Rod Mae, LCSW 11/23/2015, 3:09 PM  Ervin Knack. Dezmon Conover, MSW, Theresia Majors [phone] 11/24/2015 1:19 PM

## 2015-11-23 NOTE — Progress Notes (Signed)
Physical Therapy Treatment Patient Details Name: Jasmine Richard MRN: [id number] DOB: Aug 17, 1956 Today's Date: 11/23/2015    History of Present Illness 59 y.o. female with fall suffering left femur fracture s/p OPEN REDUCTION INTERNAL FIXATION (ORIF) DISTAL FEMUR FRACTURE (Left).    PT Comments    Patient continues to progress slowly toward PT goals. Pt with increased activity tolerance and ability to ambulate 21ft while maintaining NWB status with minimal cues. Min A for bed mobility. Current plan remains appropriate.   Follow Up Recommendations  SNF     Equipment Recommendations  Rolling walker with 5" wheels    Recommendations for Other Services       Precautions / Restrictions Precautions Precautions: Fall Required Braces or Orthoses: Other Brace/Splint Other Brace/Splint: Bledsoe brace OOB on LT Restrictions Weight Bearing Restrictions: Yes LLE Weight Bearing: Non weight bearing    Mobility  Bed Mobility Overal bed mobility: Needs Assistance Bed Mobility: Supine to Sit     Supine to sit: Min assist     General bed mobility comments: min A to bring L LE to EOB; pt able to come up into sitting with HOB flat  Transfers Overall transfer level: Needs assistance Equipment used: Rolling walker (2 wheeled) Transfers: Sit to/from Stand Sit to Stand: Min assist         General transfer comment: min A for achieving upright posture and WS to R before offloading L UE for placement on RW; vc for hand/foot placement and technique  Ambulation/Gait Ambulation/Gait assistance: Min guard Ambulation Distance (Feet): 38 Feet Assistive device: Rolling walker (2 wheeled) Gait Pattern/deviations: Step-to pattern     General Gait Details: vc to maintain NWB status when pt became fatigued; vc for sequencing and technique to conserve energy increase safety when ambulating   Stairs            Wheelchair Mobility    Modified Rankin (Stroke Patients Only)        Balance Overall balance assessment: Needs assistance Sitting-balance support: Feet supported Sitting balance-Leahy Scale: Good     Standing balance support: Bilateral upper extremity supported Standing balance-Leahy Scale: Fair (on one foot)                      Cognition Arousal/Alertness: Awake/alert Behavior During Therapy: WFL for tasks assessed/performed Overall Cognitive Status: Within Functional Limits for tasks assessed                      Exercises General Exercises - Lower Extremity Ankle Circles/Pumps: AROM;Both;20 reps;Supine Quad Sets: AROM;Both;10 reps;Supine    General Comments        Pertinent Vitals/Pain Pain Assessment: 0-10 Pain Score: 8  Pain Location: L LE Pain Descriptors / Indicators: Aching;Sore Pain Intervention(s): Limited activity within patient's tolerance;Monitored during session;Premedicated before session    Home Living                      Prior Function            PT Goals (current goals can now be found in the care plan section) Acute Rehab PT Goals Patient Stated Goal: Get rehab before going home PT Goal Formulation: With patient Time For Goal Achievement: 12/05/15 Potential to Achieve Goals: Good Progress towards PT goals: Progressing toward goals    Frequency  Min 3X/week    PT Plan Current plan remains appropriate    Co-evaluation  End of Session Equipment Utilized During Treatment: Gait belt Pattricia Boss) Activity Tolerance: Patient tolerated treatment well Patient left: in chair;with call bell/phone within reach     Time: 0914-0940 PT Time Calculation (min) (ACUTE ONLY): 26 min  Charges:  $Gait Training: 23-37 mins                    G Codes:      Derek Mound, PTA Pager: [phone]   11/23/2015, 9:49 AM

## 2015-11-23 NOTE — Progress Notes (Signed)
Patient is stable eating breakfast. Dressing c/d/i Pain controlled SNF when bed available Rx in chart  N. Glee Arvin, MD [facility] [phone] 9:15 AM

## 2015-11-23 NOTE — Clinical Social Work Note (Signed)
Clinical Social Work Assessment  Patient Details  Name: Jasmine Richard MRN: [id number] Date of Birth: January 12, 1956  Date of referral:  11/23/15               Reason for consult:  Facility Placement, Discharge Planning                Permission sought to share information with:  Oceanographer granted to share information::  Yes, Verbal Permission Granted  Name::        Agency::  [place], [place], [place] SNF  Relationship::     Contact Information:     Housing/Transportation Living arrangements for the past 2 months:   ([facility] Source of Information:  Patient Patient Interpreter Needed:  None Criminal Activity/Legal Involvement Pertinent to Current Situation/Hospitalization:  No - Comment as needed Significant Relationships:  None Lives with:  Facility Resident (at [facility]) Do you feel safe going back to the place where you live?  Yes Need for family participation in patient care:  No (Coment) (Patient able to make own decisions.)  Care giving concerns:  Patient expressed no concerns at this time.   Social Worker assessment / plan:  CSW received referral for possible SNF placement at time of discharge. CSW spoke with patient regarding discharge plan. Patient agreeable to SNF placement and understanding of CSW need to expand SNF search to surrounding counties due to patient's payer source ([employer]). CSW to continue to follow and assist with discharge planning needs.  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  Patient/Family's Understanding of and Emotional Response to Diagnosis, Current Treatment, and  Prognosis:  Patient understanding and agreeable to CSW plan of care.  Emotional Assessment Appearance:  Appears stated age Attitude/Demeanor/Rapport:  Other (Pleasant.) Affect (typically observed):  Accepting, Appropriate Orientation:  Oriented to Self, Oriented to Place, Oriented to  Time, Oriented to Situation Alcohol / Substance use:  Alcohol Use (65 days clean.) Psych involvement (Current and /or in the community):  No (Comment) (Not appropriate on this admission.)  Discharge Needs  Concerns to be addressed:  No discharge needs identified Readmission within the last 30 days:  No Current discharge risk:  None Barriers to Discharge:  No Barriers Identified   Rod Mae, LCSW 11/23/2015, 3:11 PM [phone]

## 2015-11-23 NOTE — Discharge Summary (Signed)
Physician Discharge Summary      Patient ID: Jasmine Richard MRN: [id number] DOB/AGE: 1956/04/19 59 y.o.  Admit date: 11/19/2015 Discharge date: 11/23/2015  Admission Diagnoses:  <principal problem not specified>  Discharge Diagnoses:  Active Problems:   Fracture, femur, distal, left, closed, initial encounter   Past Medical History  Diagnosis Date  . GERD (gastroesophageal reflux disease)   . Psoriatic arthritis (HCC)     Dr. Zenovia Jordan  . Osteoarthritis     Surgeries: Procedure(s): OPEN REDUCTION INTERNAL FIXATION (ORIF) DISTAL FEMUR FRACTURE on 11/19/2015 - 11/20/2015   Consultants (if any):    Discharged Condition: Improved  Hospital Course: Jasmine Richard is an 59 y.o. female who was admitted 11/19/2015 with a diagnosis of <principal problem not specified> and went to the operating room on 11/19/2015 - 11/20/2015 and underwent the above named procedures.    She was given perioperative antibiotics:  Anti-infectives    Start     Dose/Rate Route Frequency Ordered Stop   11/20/15 1530  ceFAZolin (ANCEF) IVPB 2 g/50 mL premix     2 g 100 mL/hr over 30 Minutes Intravenous Every 6 hours 11/20/15 1449 11/21/15 0400   11/20/15 1000  ceFAZolin (ANCEF) IVPB 2 g/50 mL premix    Comments:  Anesthesia to give preop   2 g 100 mL/hr over 30 Minutes Intravenous To Short Stay 11/20/15 0740 11/21/15 1000    .  She was given sequential compression devices, early ambulation, and lovenox for DVT prophylaxis.  She benefited maximally from the hospital stay and there were no complications.    Recent vital signs:  Filed Vitals:   11/22/15 2051 11/23/15 0601  BP: 110/55 112/56  Pulse: 77 81  Temp: 100.7 F (38.2 C) 98.9 F (37.2 C)  Resp: 17 18    Recent laboratory studies:  Lab Results  Component Value Date   HGB 7.8* 11/21/2015   HGB 9.6* 11/20/2015   HGB 13.6 11/19/2015   Lab Results  Component Value Date   WBC 7.7 11/21/2015   PLT 129* 11/21/2015   No  results found for: INR Lab Results  Component Value Date   NA 142 11/19/2015   K 4.5 11/19/2015   CL 103 11/19/2015   CO2 17* 02/01/2013   BUN 14 11/19/2015   CREATININE 0.77 11/20/2015   GLUCOSE 115* 11/19/2015    Discharge Medications:     Medication List    TAKE these medications        acetaminophen 500 MG tablet  Commonly known as:  TYLENOL  Take 1,000 mg by mouth 3 (three) times daily as needed for moderate pain.     adalimumab 40 MG/0.8ML injection  Commonly known as:  HUMIRA  Inject 40 mg into the skin every 14 (fourteen) days.     CALCIUM 1200 PO  Take 1 tablet by mouth 2 (two) times daily.     cetirizine 10 MG tablet  Commonly known as:  ZYRTEC  Take 10 mg by mouth daily.     cyanocobalamin 1000 MCG/ML injection  Commonly known as:  (VITAMIN B-12)  Inject 1 mL (1,000 mcg total) into the muscle every 30 (thirty) days.     enoxaparin 40 MG/0.4ML injection  Commonly known as:  LOVENOX  Inject 0.4 mLs (40 mg total) into the skin daily.     escitalopram 20 MG tablet  Commonly known as:  LEXAPRO  Take 1 tablet (20 mg total) by mouth daily.     fluticasone 50 MCG/ACT nasal  spray  Commonly known as:  FLONASE  Place 1-2 sprays into both nostrils daily.     MAGNESIUM PO  Take 250 mg by mouth 2 (two) times daily.     meloxicam 15 MG tablet  Commonly known as:  MOBIC  Take 7.5 mg by mouth 2 (two) times daily.     methocarbamol 750 MG tablet  Commonly known as:  ROBAXIN  Take 1 tablet (750 mg total) by mouth 2 (two) times daily as needed for muscle spasms.     mirtazapine 15 MG tablet  Commonly known as:  REMERON  Take 15 mg by mouth at bedtime.     multivitamin with minerals tablet  Take 1 tablet by mouth daily.     omeprazole 20 MG capsule  Commonly known as:  PRILOSEC  Take 20 mg by mouth 2 (two) times daily before a meal.     ondansetron 4 MG tablet  Commonly known as:  ZOFRAN  Take 1-2 tablets (4-8 mg total) by mouth every 8 (eight) hours as  needed for nausea or vomiting.     oxyCODONE 10 mg 12 hr tablet  Commonly known as:  OXYCONTIN  Take 1 tablet (10 mg total) by mouth every 12 (twelve) hours.     oxyCODONE-acetaminophen 5-325 MG tablet  Commonly known as:  PERCOCET  Take 1-2 tablets by mouth every 4 (four) hours as needed for severe pain.     POTASSIUM PO  Take 250 mg by mouth 2 (two) times daily.     senna-docusate 8.6-50 MG tablet  Commonly known as:  SENOKOT S  Take 1 tablet by mouth at bedtime as needed.     SUMAtriptan 100 MG tablet  Commonly known as:  IMITREX  May repeat in 2 hours if headache persists or recurs.     thiamine 50 MG tablet  Take 50 mg by mouth 2 (two) times daily.     vitamin C 500 MG tablet  Commonly known as:  ASCORBIC ACID  Take 500 mg by mouth 2 (two) times daily.        Diagnostic Studies: Ct Knee Left Wo Contrast  11/19/2015  CLINICAL DATA:  59 year old female with fall and left knee pain EXAM: CT OF THE left KNEE WITHOUT CONTRAST TECHNIQUE: Multidetector CT imaging of the left knee was performed according to the standard protocol. Multiplanar CT image reconstructions were also generated. COMPARISON:  Radiograph dated 11/19/15 FINDINGS: The bones are osteopenic. There is a comminuted fracture of distal femoral metaphysis with extension of the fracture lines into the articular surface of the medial condyle. There is also extension of the fracture into the lateral epicondyle. There is posterior angulation of the distal fracture fragment at the distal femoral metaphysis. The visualized tibia and fibula appear unremarkable. There is anatomic alignment of the distal femur and proximal tibia. The patella appears unremarkable. There is suprapatellar hemarthrosis as well as diffuse soft tissues teratoma. IMPRESSION: Comminuted intra-articular fracture of the distal femoral metaphysis with posterior angulation of the distal fracture fragment. Electronically Signed   By: Elgie Collard [name]   On:  11/19/2015 21:07   Dg Chest Portable 1 View  11/19/2015  CLINICAL DATA:  59 year old female with preop evaluation EXAM: PORTABLE CHEST 1 VIEW COMPARISON:  Radiographs dated 07/07/2009 FINDINGS: Single-view of the chest does not demonstrate any focal consolidation, pleural effusion, or pneumothorax. There is mild diffuse interstitial prominence. Cyst cardiac silhouette is within normal limits. The osseous structures are grossly unremarkable. Surgical clips noted gastric  area. IMPRESSION: No active disease. Electronically Signed   By: Elgie Collard [name]   On: 11/19/2015 18:19   Dg Knee Complete 4 Views Left  11/19/2015  CLINICAL DATA:  Larey Seat while standing on a chair today, fell onto LEFT knee, generalized LEFT knee pain and swelling, unable to fully extend knee, initial encounter EXAM: LEFT KNEE - COMPLETE 4+ VIEW COMPARISON:  None FINDINGS: Diffuse osseous demineralization. Comminuted displaced distal metaphyseal fracture of the LEFT femur extending into the intercondylar notch. Posterior dislocation on of the distal condylar fragments with apex anterior and lateral angulation. Associated soft tissue swelling. Patella, tibia, and fibula appear grossly intact. IMPRESSION: Comminuted displaced and angulated intra-articular distal LEFT femoral metaphyseal fracture as above. Electronically Signed   By: Ulyses Southward [name]   On: 11/19/2015 17:37   Dg C-arm 1-60 Min-no Report  11/20/2015  CLINICAL DATA: fractured left femur C-ARM 1-60 MINUTES Fluoroscopy was utilized by the requesting physician.  No radiographic interpretation.   Dg C-arm 61-120 Min  11/20/2015  ADDENDUM REPORT: 11/20/2015 14:06 Electronically Signed   By: Signa Kell [name]   On: 11/20/2015 14:06  11/20/2015  CLINICAL DATA:  Status post ORIF of left femur fracture. EXAM: DG C-ARM 61-120 MIN; LEFT FEMUR 2 VIEWS; LEFT FEMUR PORTABLE 2 VIEWS COMPARISON:  Earlier today. FINDINGS: There is been interval open reduction and internal fixation  of the distal femur fracture. Sideplate and screw device fixes the comminuted fracture of the distal left femur. The alignment is anatomic. IMPRESSION: 1. Status post ORIF of distal femur fracture. Electronically Signed: By: Signa Kell [name] On: 11/20/2015 12:24   Dg Femur Min 2 Views Left  11/20/2015  ADDENDUM REPORT: 11/20/2015 14:06 Electronically Signed   By: Signa Kell [name]   On: 11/20/2015 14:06  11/20/2015  CLINICAL DATA:  Status post ORIF of left femur fracture. EXAM: DG C-ARM 61-120 MIN; LEFT FEMUR 2 VIEWS; LEFT FEMUR PORTABLE 2 VIEWS COMPARISON:  Earlier today. FINDINGS: There is been interval open reduction and internal fixation of the distal femur fracture. Sideplate and screw device fixes the comminuted fracture of the distal left femur. The alignment is anatomic. IMPRESSION: 1. Status post ORIF of distal femur fracture. Electronically Signed: By: Signa Kell [name] On: 11/20/2015 12:24   Dg Femur Min 2 Views Left  11/20/2015  CLINICAL DATA:  Status post external fixation of left distal femoral fracture. Initial encounter. EXAM: LEFT FEMUR 2 VIEWS COMPARISON:  Left knee radiographs performed 11/19/2015 FINDINGS: Two fluoroscopic C-arm images are provided from the OR, demonstrating the comminuted distal femoral fracture in improved alignment, though there is mild residual displacement of fragments, particularly posteriorly. Associated external fixation hardware is not characterized on this study. Underlying marginal osteophytes are noted at the medial compartment. IMPRESSION: Improved alignment of comminuted distal femoral fracture, though there is mild residual displacement of fragments. Electronically Signed   By: Roanna Raider [name]   On: 11/20/2015 01:06   Dg Femur Port Min 2 Views Left  11/20/2015  ADDENDUM REPORT: 11/20/2015 14:06 Electronically Signed   By: Signa Kell [name]   On: 11/20/2015 14:06  11/20/2015  CLINICAL DATA:  Status post ORIF of left femur fracture. EXAM:  DG C-ARM 61-120 MIN; LEFT FEMUR 2 VIEWS; LEFT FEMUR PORTABLE 2 VIEWS COMPARISON:  Earlier today. FINDINGS: There is been interval open reduction and internal fixation of the distal femur fracture. Sideplate and screw device fixes the comminuted fracture of the distal left femur. The alignment is anatomic. IMPRESSION: 1. Status post  ORIF of distal femur fracture. Electronically Signed: By: Signa Kell [name] On: 11/20/2015 12:24    Disposition: 01-Home or Self Care      Discharge Instructions    Call MD / Call 911    Complete by:  As directed   If you experience chest pain or shortness of breath, CALL 911 and be transported to the hospital emergency room.  If you develope a fever above 101.5 F, pus (white drainage) or increased drainage or redness at the wound, or calf pain, call your surgeon's office.     Constipation Prevention    Complete by:  As directed   Drink plenty of fluids.  Prune juice may be helpful.  You may use a stool softener, such as Colace (over the counter) 100 mg twice a day.  Use MiraLax (over the counter) for constipation as needed.     Diet - low sodium heart healthy    Complete by:  As directed      Diet general    Complete by:  As directed      Driving restrictions    Complete by:  As directed   No driving while taking narcotic pain meds.     Increase activity slowly as tolerated    Complete by:  As directed      Suggamadex Discharge Instructions    Complete by:  As directed   During your recent anesthetic, you were given the medication sugammadex (Bridion). This medication interacts with hormonal forms of birth control (oral contraceptives and injected or implanted birth control) and may make them ineffective. IF YOU USE ANY HORMONAL FORM OF BIRTH CONTROL, YOU MUST USE AN ADDITIONAL BARRIER BIRTH CONTROL FOR METHOD FOR SEVEN DAYS after receiving sugammadex (Bridion) or there is a chance you could become pregnant.           Follow-up Information    Follow up  with Cheral Almas, MD In 2 weeks.   Specialty:  Orthopedic Surgery   Why:  For suture removal, For wound re-check   Contact information:   [address] [place] [address] [phone]        Signed: Cheral Almas 11/23/2015, 9:21 AM

## 2015-11-24 ENCOUNTER — Encounter (HOSPITAL_COMMUNITY): Payer: Self-pay | Admitting: Orthopaedic Surgery

## 2015-11-24 NOTE — Progress Notes (Signed)
Jasmine Richard discharged to SNF per MD order. All questions and concerns answered. Copy of instructions and scripts given to patient. IV removed.  Patient escorted to car by staff in a wheelchair (son is transporting patient). No distress noted upon discharge.   Rosita Fire 11/24/2015 4:24 PM

## 2015-11-24 NOTE — Clinical Social Work Note (Signed)
Patient to be d/c'ed today to [facility].  Patient and family agreeable to plans will transport via son's car RN to call report to [phone]  Windell Moulding, MSW, Theresia Majors [phone]

## 2015-11-26 ENCOUNTER — Encounter (HOSPITAL_COMMUNITY): Payer: Self-pay | Admitting: Orthopaedic Surgery

## 2015-12-10 ENCOUNTER — Ambulatory Visit: Admitting: Nurse Practitioner

## 2015-12-10 ENCOUNTER — Encounter: Payer: Self-pay | Admitting: Nurse Practitioner

## 2015-12-13 ENCOUNTER — Encounter: Payer: Self-pay | Admitting: Gastroenterology

## 2015-12-14 ENCOUNTER — Ambulatory Visit: Payer: Self-pay | Admitting: Gastroenterology

## 2016-01-01 IMAGING — CT CT KNEE*L* W/O CM
3 of 4 series · 15 of 33 positions shown, 18 images · non-contrast
Comparison: Radiograph dated 11/19/15

CLINICAL DATA: 59-year-old female with fall and left knee pain

EXAM:
CT OF THE left KNEE WITHOUT CONTRAST
TECHNIQUE: Multidetector CT imaging of the left knee was performed according to
the standard protocol. Multiplanar CT image reconstructions were
also generated.

[Series 5: coronal bone · coronal · 0.35mm/px · 3 of 106 slices shown]
[im 22/106  bone]
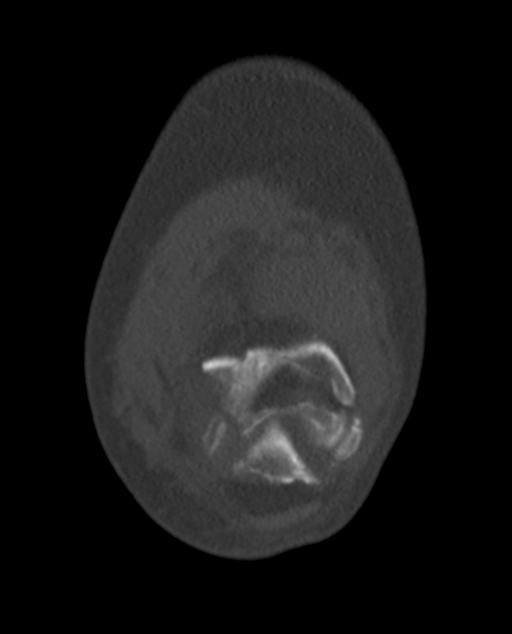
[im 43/106  bone]
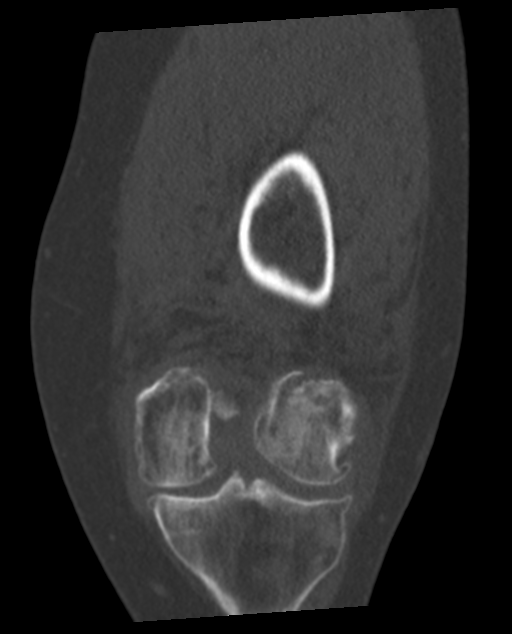
[im 64/106  bone]
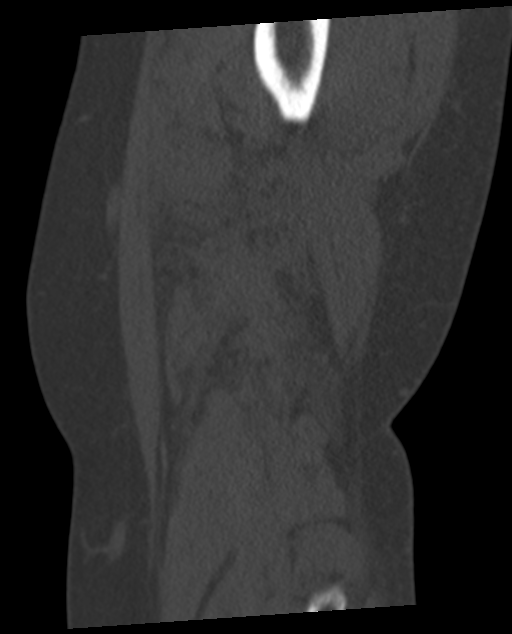

[Series 6: sagittal bone · sagittal · 0.42mm/px · 5 of 89 slices shown, 6 images]
[im 30/89  bone]
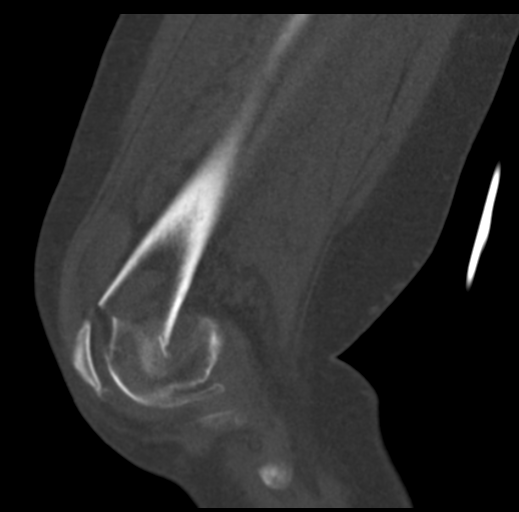
[im 37/89  bone]
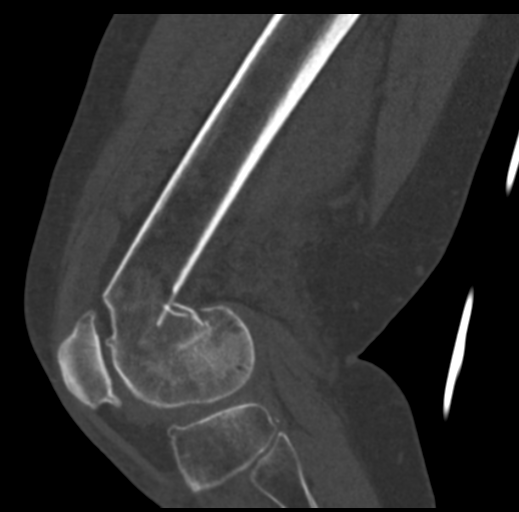
[im 45/89  soft-tissue]
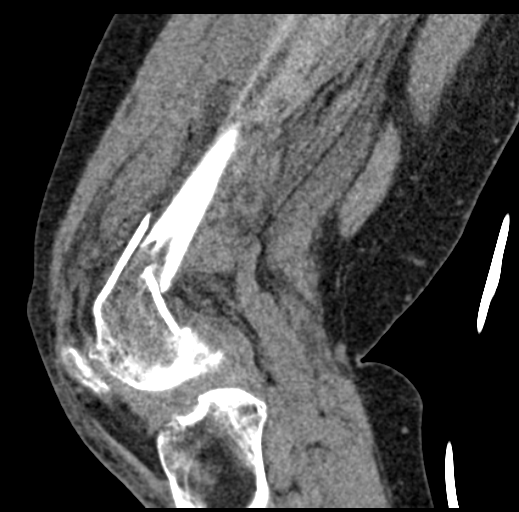
[im 45/89  bone]
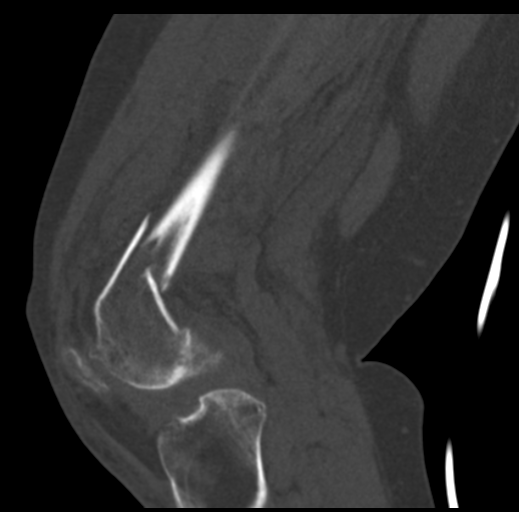
[im 52/89  bone]
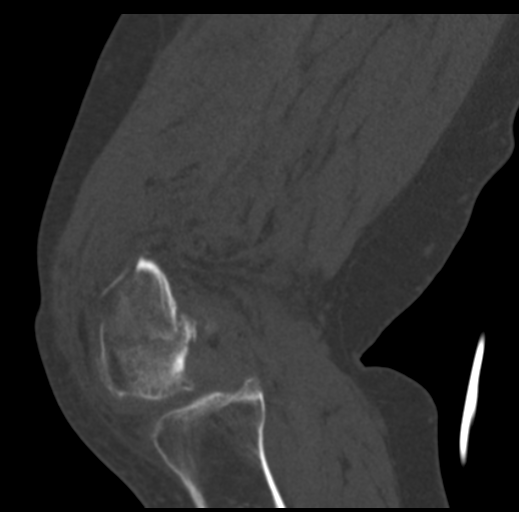
[im 59/89  bone]
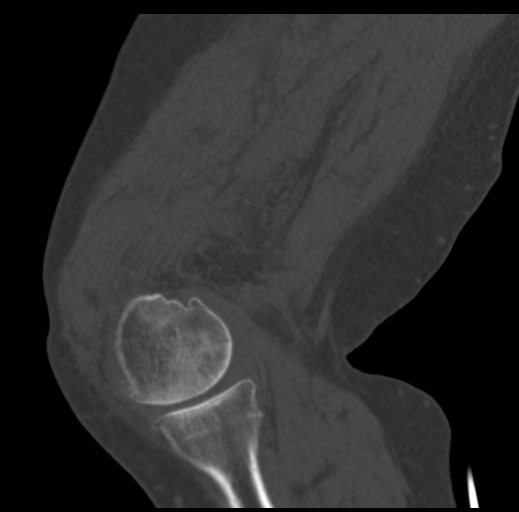

[Series 7: axial bone · axial · 0.35mm/px · z∈[+439,+607]mm · 7 of 108 slices shown, 9 images]
[im 12/108  soft-tissue]
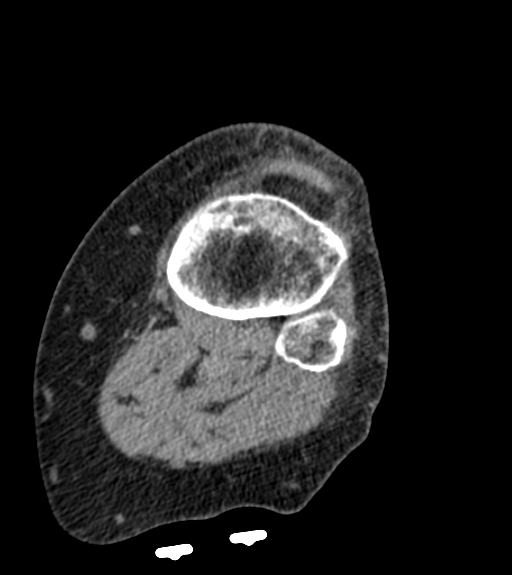
[im 12/108  bone]
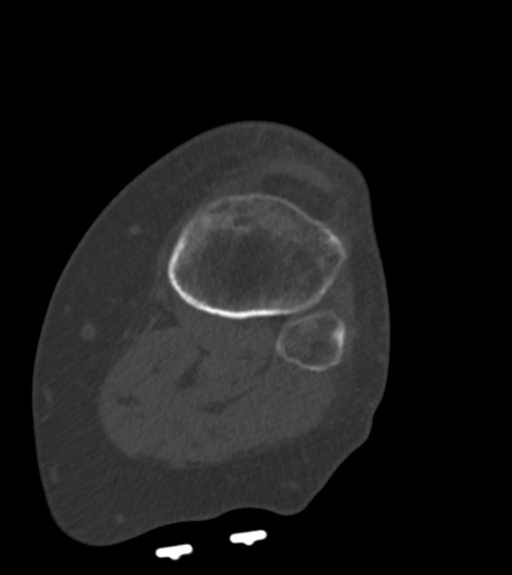
[im 24/108  bone]
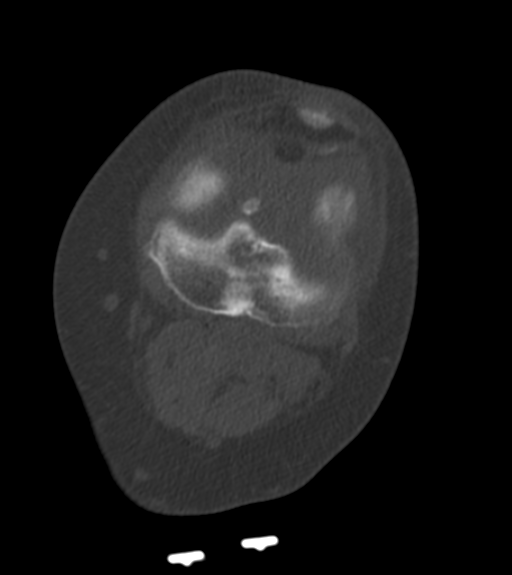
[im 36/108  bone]
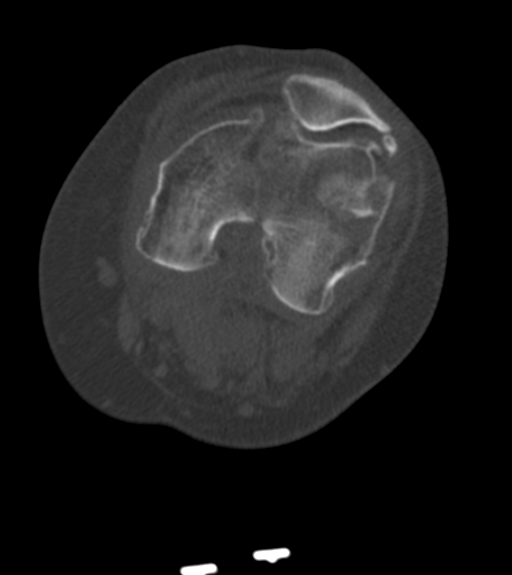
[im 60/108  bone]
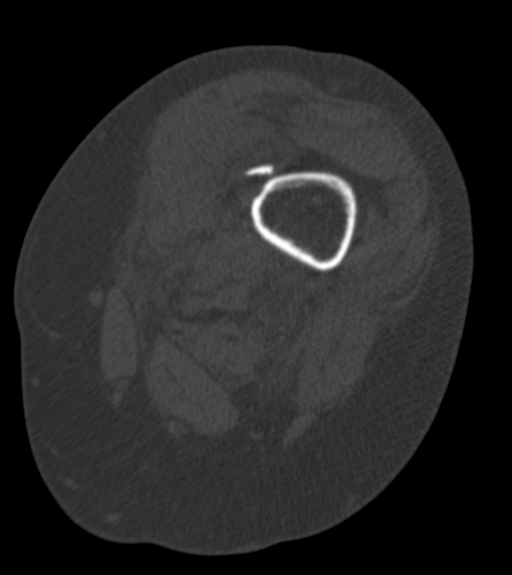
[im 72/108  soft-tissue]
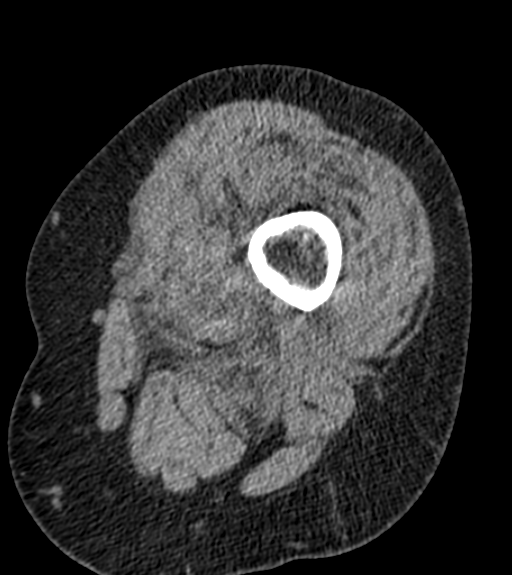
[im 72/108  bone]
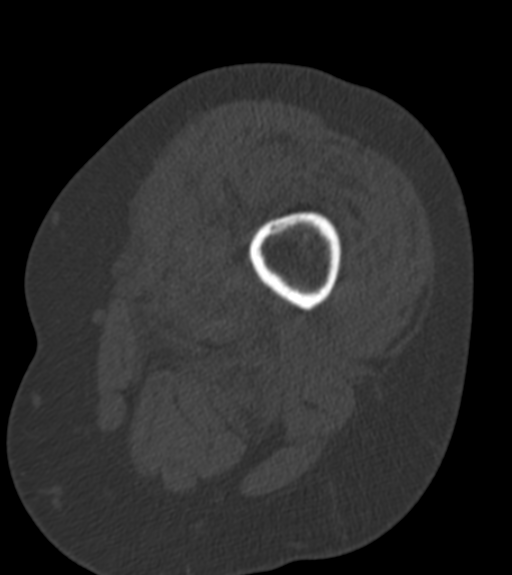
[im 84/108  bone]
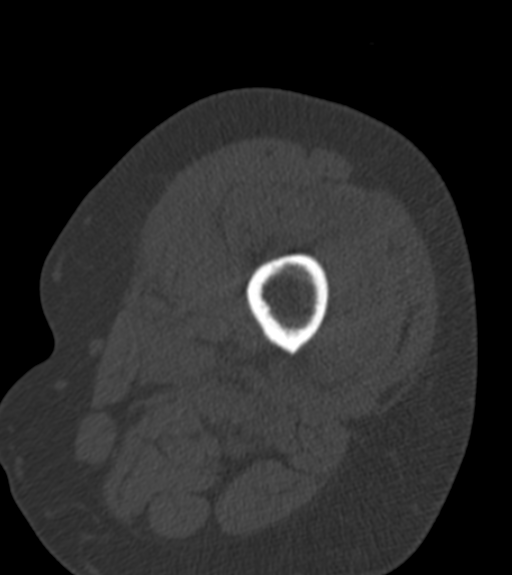
[im 96/108  bone]
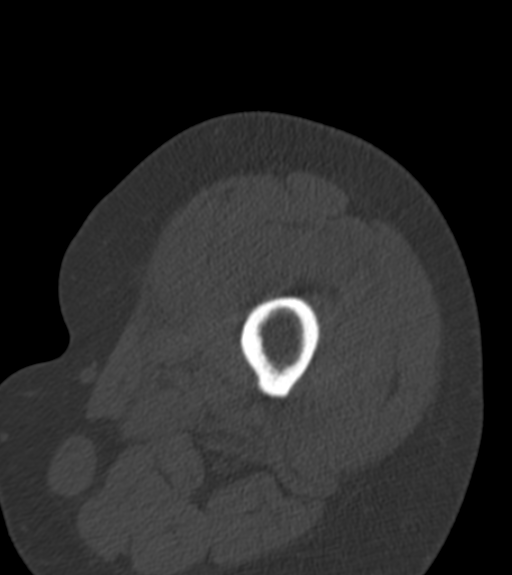

[15 of 33 positions shown; findings below may reference images not displayed]

FINDINGS: The bones are osteopenic. There is a comminuted fracture of distal
femoral metaphysis with extension of the fracture lines into the
articular surface of the medial condyle. There is also extension of
the fracture into the lateral epicondyle. There is posterior
angulation of the distal fracture fragment at the distal femoral
metaphysis. The visualized tibia and fibula appear unremarkable.
There is anatomic alignment of the distal femur and proximal tibia.
The patella appears unremarkable. There is suprapatellar
hemarthrosis as well as diffuse soft tissues teratoma.
IMPRESSION: Comminuted intra-articular fracture of the distal femoral metaphysis
with posterior angulation of the distal fracture fragment.

## 2016-01-27 ENCOUNTER — Encounter: Payer: Self-pay | Admitting: Gastroenterology

## 2016-02-03 ENCOUNTER — Ambulatory Visit: Payer: Self-pay | Admitting: Gastroenterology

## 2016-02-17 ENCOUNTER — Ambulatory Visit: Payer: Self-pay | Admitting: Gastroenterology

## 2016-02-22 ENCOUNTER — Ambulatory Visit (INDEPENDENT_AMBULATORY_CARE_PROVIDER_SITE_OTHER): Admitting: Psychiatry

## 2016-02-22 DIAGNOSIS — F1020 Alcohol dependence, uncomplicated: Secondary | ICD-10-CM

## 2016-02-22 DIAGNOSIS — F4323 Adjustment disorder with mixed anxiety and depressed mood: Secondary | ICD-10-CM | POA: Diagnosis not present

## 2016-03-02 ENCOUNTER — Ambulatory Visit (INDEPENDENT_AMBULATORY_CARE_PROVIDER_SITE_OTHER): Admitting: Psychiatry

## 2016-03-02 DIAGNOSIS — F4323 Adjustment disorder with mixed anxiety and depressed mood: Secondary | ICD-10-CM | POA: Diagnosis not present

## 2016-03-02 DIAGNOSIS — F1020 Alcohol dependence, uncomplicated: Secondary | ICD-10-CM | POA: Diagnosis not present

## 2016-03-09 ENCOUNTER — Ambulatory Visit (INDEPENDENT_AMBULATORY_CARE_PROVIDER_SITE_OTHER): Admitting: Psychiatry

## 2016-03-09 DIAGNOSIS — F4323 Adjustment disorder with mixed anxiety and depressed mood: Secondary | ICD-10-CM | POA: Diagnosis not present

## 2016-03-09 DIAGNOSIS — F1020 Alcohol dependence, uncomplicated: Secondary | ICD-10-CM | POA: Diagnosis not present

## 2016-03-16 ENCOUNTER — Ambulatory Visit (INDEPENDENT_AMBULATORY_CARE_PROVIDER_SITE_OTHER): Admitting: Psychiatry

## 2016-03-16 DIAGNOSIS — F4323 Adjustment disorder with mixed anxiety and depressed mood: Secondary | ICD-10-CM | POA: Diagnosis not present

## 2016-03-16 DIAGNOSIS — F1020 Alcohol dependence, uncomplicated: Secondary | ICD-10-CM

## 2016-03-21 ENCOUNTER — Other Ambulatory Visit: Payer: Self-pay | Admitting: Orthopaedic Surgery

## 2016-03-21 ENCOUNTER — Encounter (HOSPITAL_BASED_OUTPATIENT_CLINIC_OR_DEPARTMENT_OTHER): Payer: Self-pay | Admitting: *Deleted

## 2016-03-22 ENCOUNTER — Encounter (HOSPITAL_BASED_OUTPATIENT_CLINIC_OR_DEPARTMENT_OTHER)
Admission: RE | Disposition: A | Discharge status: Home / Self Care | Payer: Self-pay | Source: Ambulatory Visit | Attending: Orthopaedic Surgery

## 2016-03-22 ENCOUNTER — Ambulatory Visit (HOSPITAL_BASED_OUTPATIENT_CLINIC_OR_DEPARTMENT_OTHER): Admitting: Anesthesiology

## 2016-03-22 ENCOUNTER — Ambulatory Visit (HOSPITAL_COMMUNITY)

## 2016-03-22 ENCOUNTER — Encounter (HOSPITAL_BASED_OUTPATIENT_CLINIC_OR_DEPARTMENT_OTHER): Payer: Self-pay | Admitting: Anesthesiology

## 2016-03-22 ENCOUNTER — Ambulatory Visit (HOSPITAL_BASED_OUTPATIENT_CLINIC_OR_DEPARTMENT_OTHER)
Admission: RE | Admit: 2016-03-22 | Discharge: 2016-03-22 | Disposition: A | Discharge status: Home / Self Care | Source: Ambulatory Visit | Attending: Orthopaedic Surgery | Admitting: Orthopaedic Surgery

## 2016-03-22 DIAGNOSIS — Z4589 Encounter for adjustment and management of other implanted devices: Secondary | ICD-10-CM | POA: Insufficient documentation

## 2016-03-22 DIAGNOSIS — Z419 Encounter for procedure for purposes other than remedying health state, unspecified: Secondary | ICD-10-CM

## 2016-03-22 HISTORY — DX: Anxiety disorder, unspecified: F41.9

## 2016-03-22 HISTORY — DX: Depression, unspecified: F32.A

## 2016-03-22 HISTORY — DX: Major depressive disorder, single episode, unspecified: F32.9

## 2016-03-22 HISTORY — PX: HARDWARE REMOVAL: SHX979

## 2016-03-22 HISTORY — DX: Headache, unspecified: R51.9

## 2016-03-22 HISTORY — DX: Cardiac murmur, unspecified: R01.1

## 2016-03-22 HISTORY — DX: Headache: R51

## 2016-03-22 HISTORY — DX: Alcohol abuse, in remission: F10.11

## 2016-03-22 LAB — POCT HEMOGLOBIN-HEMACUE: Hemoglobin: 12.7 g/dL (ref 12.0–15.0)

## 2016-03-22 SURGERY — REMOVAL, HARDWARE
Anesthesia: General | Site: Knee | Laterality: Left

## 2016-03-22 MED ORDER — FENTANYL CITRATE (PF) 100 MCG/2ML IJ SOLN: 50.0000 ug | INTRAMUSCULAR | Status: DC | PRN

## 2016-03-22 MED ORDER — PROPOFOL 10 MG/ML IV BOLUS
INTRAVENOUS | Status: AC
  Filled 2016-03-22: qty 40

## 2016-03-22 MED ORDER — SCOPOLAMINE 1 MG/3DAYS TD PT72: 1.0000 | MEDICATED_PATCH | Freq: Once | TRANSDERMAL | Status: DC | PRN

## 2016-03-22 MED ORDER — DEXAMETHASONE SODIUM PHOSPHATE 4 MG/ML IJ SOLN
INTRAMUSCULAR | Status: DC | PRN
  Administered 2016-03-22: 10 mg via INTRAVENOUS

## 2016-03-22 MED ORDER — CEFAZOLIN SODIUM-DEXTROSE 2-4 GM/100ML-% IV SOLN
INTRAVENOUS | Status: AC
  Filled 2016-03-22: qty 100

## 2016-03-22 MED ORDER — ATROPINE SULFATE 0.4 MG/ML IJ SOLN
INTRAMUSCULAR | Status: AC
  Filled 2016-03-22: qty 1

## 2016-03-22 MED ORDER — BUPIVACAINE HCL (PF) 0.5 % IJ SOLN
INTRAMUSCULAR | Status: AC
  Filled 2016-03-22: qty 30

## 2016-03-22 MED ORDER — KETOROLAC TROMETHAMINE 30 MG/ML IJ SOLN
INTRAMUSCULAR | Status: AC
  Filled 2016-03-22: qty 1

## 2016-03-22 MED ORDER — HYDROMORPHONE HCL 1 MG/ML IJ SOLN
INTRAMUSCULAR | Status: AC
  Filled 2016-03-22: qty 1

## 2016-03-22 MED ORDER — PHENYLEPHRINE HCL 10 MG/ML IJ SOLN
INTRAMUSCULAR | Status: AC
  Filled 2016-03-22: qty 1

## 2016-03-22 MED ORDER — ONDANSETRON HCL 4 MG/2ML IJ SOLN: 4.0000 mg | Freq: Once | INTRAMUSCULAR | Status: DC | PRN

## 2016-03-22 MED ORDER — METHOCARBAMOL 750 MG PO TABS: 750.0000 mg | ORAL_TABLET | Freq: Two times a day (BID) | ORAL | Status: DC | PRN

## 2016-03-22 MED ORDER — LIDOCAINE HCL (PF) 1 % IJ SOLN
INTRAMUSCULAR | Status: AC
  Filled 2016-03-22: qty 30

## 2016-03-22 MED ORDER — HYDROMORPHONE HCL 1 MG/ML IJ SOLN
0.2500 mg | INTRAMUSCULAR | Status: DC | PRN
  Administered 2016-03-22 (×4): 0.5 mg via INTRAVENOUS

## 2016-03-22 MED ORDER — LACTATED RINGERS IV SOLN
INTRAVENOUS | Status: DC
  Administered 2016-03-22 (×2): via INTRAVENOUS

## 2016-03-22 MED ORDER — GLYCOPYRROLATE 0.2 MG/ML IJ SOLN: 0.2000 mg | Freq: Once | INTRAMUSCULAR | Status: DC | PRN

## 2016-03-22 MED ORDER — HYDROCODONE-ACETAMINOPHEN 7.5-325 MG PO TABS: 1.0000 | ORAL_TABLET | Freq: Four times a day (QID) | ORAL | Status: DC | PRN

## 2016-03-22 MED ORDER — EPHEDRINE SULFATE 50 MG/ML IJ SOLN
INTRAMUSCULAR | Status: AC
  Filled 2016-03-22: qty 1

## 2016-03-22 MED ORDER — ONDANSETRON HCL 4 MG/2ML IJ SOLN
INTRAMUSCULAR | Status: AC
  Filled 2016-03-22: qty 2

## 2016-03-22 MED ORDER — MIDAZOLAM HCL 2 MG/2ML IJ SOLN
INTRAMUSCULAR | Status: AC
  Filled 2016-03-22: qty 2

## 2016-03-22 MED ORDER — ONDANSETRON HCL 4 MG/2ML IJ SOLN
INTRAMUSCULAR | Status: DC | PRN
  Administered 2016-03-22: 4 mg via INTRAVENOUS

## 2016-03-22 MED ORDER — FENTANYL CITRATE (PF) 100 MCG/2ML IJ SOLN
INTRAMUSCULAR | Status: AC
  Filled 2016-03-22: qty 2

## 2016-03-22 MED ORDER — ONDANSETRON HCL 4 MG PO TABS
4.0000 mg | ORAL_TABLET | Freq: Three times a day (TID) | ORAL | Status: DC | PRN
Start: 2016-03-22 — End: 2017-07-26

## 2016-03-22 MED ORDER — SUCCINYLCHOLINE CHLORIDE 20 MG/ML IJ SOLN
INTRAMUSCULAR | Status: AC
Start: 2016-03-22 — End: 2016-03-22
  Filled 2016-03-22: qty 1

## 2016-03-22 MED ORDER — LIDOCAINE HCL (CARDIAC) 20 MG/ML IV SOLN
INTRAVENOUS | Status: DC | PRN
  Administered 2016-03-22: 50 mg via INTRAVENOUS

## 2016-03-22 MED ORDER — GLYCOPYRROLATE 0.2 MG/ML IJ SOLN
INTRAMUSCULAR | Status: AC
  Filled 2016-03-22: qty 1

## 2016-03-22 MED ORDER — KETOROLAC TROMETHAMINE 30 MG/ML IJ SOLN
INTRAMUSCULAR | Status: DC | PRN
  Administered 2016-03-22: 30 mg via INTRAVENOUS

## 2016-03-22 MED ORDER — FENTANYL CITRATE (PF) 100 MCG/2ML IJ SOLN
INTRAMUSCULAR | Status: DC | PRN
  Administered 2016-03-22 (×2): 25 ug via INTRAVENOUS
  Administered 2016-03-22: 50 ug via INTRAVENOUS
  Administered 2016-03-22: 100 ug via INTRAVENOUS

## 2016-03-22 MED ORDER — OXYCODONE HCL 5 MG PO TABS
ORAL_TABLET | ORAL | Status: AC
  Filled 2016-03-22: qty 1

## 2016-03-22 MED ORDER — HYDROCODONE-ACETAMINOPHEN 5-325 MG PO TABS
ORAL_TABLET | ORAL | Status: AC
  Filled 2016-03-22: qty 1

## 2016-03-22 MED ORDER — PROPOFOL 10 MG/ML IV BOLUS
INTRAVENOUS | Status: DC | PRN
  Administered 2016-03-22: 200 mg via INTRAVENOUS

## 2016-03-22 MED ORDER — MIDAZOLAM HCL 2 MG/2ML IJ SOLN: 1.0000 mg | INTRAMUSCULAR | Status: DC | PRN

## 2016-03-22 MED ORDER — CEFAZOLIN SODIUM-DEXTROSE 2-4 GM/100ML-% IV SOLN
2.0000 g | INTRAVENOUS | Status: AC
  Administered 2016-03-22: 2 g via INTRAVENOUS

## 2016-03-22 MED ORDER — MEPERIDINE HCL 25 MG/ML IJ SOLN: 6.2500 mg | INTRAMUSCULAR | Status: DC | PRN

## 2016-03-22 MED ORDER — LIDOCAINE HCL (CARDIAC) 20 MG/ML IV SOLN
INTRAVENOUS | Status: AC
  Filled 2016-03-22: qty 5

## 2016-03-22 MED ORDER — BUPIVACAINE HCL (PF) 0.25 % IJ SOLN
INTRAMUSCULAR | Status: DC | PRN
  Administered 2016-03-22: 20 mL

## 2016-03-22 MED ORDER — MIDAZOLAM HCL 5 MG/5ML IJ SOLN
INTRAMUSCULAR | Status: DC | PRN
  Administered 2016-03-22: 2 mg via INTRAVENOUS

## 2016-03-22 MED ORDER — DEXAMETHASONE SODIUM PHOSPHATE 10 MG/ML IJ SOLN
INTRAMUSCULAR | Status: AC
  Filled 2016-03-22: qty 1

## 2016-03-22 MED ORDER — BUPIVACAINE HCL (PF) 0.25 % IJ SOLN
INTRAMUSCULAR | Status: AC
  Filled 2016-03-22: qty 30

## 2016-03-22 MED ORDER — HYDROCODONE-ACETAMINOPHEN 5-325 MG PO TABS
1.0000 | ORAL_TABLET | Freq: Once | ORAL | Status: AC
  Administered 2016-03-22: 1 via ORAL

## 2016-03-22 SURGICAL SUPPLY — 80 items
BANDAGE ACE 4X5 VEL STRL LF (GAUZE/BANDAGES/DRESSINGS) IMPLANT
BANDAGE ACE 6X5 VEL STRL LF (GAUZE/BANDAGES/DRESSINGS) ×3 IMPLANT
BANDAGE ESMARK 6X9 LF (GAUZE/BANDAGES/DRESSINGS) ×1 IMPLANT
BENZOIN TINCTURE PRP APPL 2/3 (GAUZE/BANDAGES/DRESSINGS) IMPLANT
BLADE HEX COATED 2.75 (ELECTRODE) IMPLANT
BLADE SURG 15 STRL LF DISP TIS (BLADE) ×1 IMPLANT
BLADE SURG 15 STRL SS (BLADE) ×2
BNDG COHESIVE 3X5 TAN STRL LF (GAUZE/BANDAGES/DRESSINGS) IMPLANT
BNDG ESMARK 6X9 LF (GAUZE/BANDAGES/DRESSINGS) ×3
CANISTER SUCT 1200ML W/VALVE (MISCELLANEOUS) ×3 IMPLANT
CLOSURE WOUND 1/2 X4 (GAUZE/BANDAGES/DRESSINGS)
COVER BACK TABLE 60X90IN (DRAPES) IMPLANT
CUFF TOURNIQUET SINGLE 24IN (TOURNIQUET CUFF) IMPLANT
CUFF TOURNIQUET SINGLE 34IN LL (TOURNIQUET CUFF) ×3 IMPLANT
DECANTER SPIKE VIAL GLASS SM (MISCELLANEOUS) IMPLANT
DRAPE C-ARM 42X72 X-RAY (DRAPES) ×3 IMPLANT
DRAPE C-ARMOR (DRAPES) ×3 IMPLANT
DRAPE EXTREMITY T 121X128X90 (DRAPE) IMPLANT
DRAPE IMP U-DRAPE 54X76 (DRAPES) IMPLANT
DRAPE OEC MINIVIEW 54X84 (DRAPES) IMPLANT
DRAPE U-SHAPE 47X51 STRL (DRAPES) IMPLANT
DRSG MEPILEX BORDER 4X8 (GAUZE/BANDAGES/DRESSINGS) ×3 IMPLANT
DURAPREP 26ML APPLICATOR (WOUND CARE) ×3 IMPLANT
ELECT REM PT RETURN 9FT ADLT (ELECTROSURGICAL) ×3
ELECTRODE REM PT RTRN 9FT ADLT (ELECTROSURGICAL) ×1 IMPLANT
GAUZE SPONGE 4X4 12PLY STRL (GAUZE/BANDAGES/DRESSINGS) ×3 IMPLANT
GAUZE SPONGE 4X4 16PLY XRAY LF (GAUZE/BANDAGES/DRESSINGS) IMPLANT
GAUZE XEROFORM 1X8 LF (GAUZE/BANDAGES/DRESSINGS) ×3 IMPLANT
GLOVE BIO SURGEON STRL SZ 6.5 (GLOVE) ×2 IMPLANT
GLOVE BIO SURGEONS STRL SZ 6.5 (GLOVE) ×1
GLOVE BIOGEL PI IND STRL 7.0 (GLOVE) ×1 IMPLANT
GLOVE BIOGEL PI INDICATOR 7.0 (GLOVE) ×2
GLOVE SKINSENSE NS SZ7.5 (GLOVE) ×2
GLOVE SKINSENSE STRL SZ7.5 (GLOVE) ×1 IMPLANT
GLOVE SURG SYN 7.5  E (GLOVE) ×2
GLOVE SURG SYN 7.5 E (GLOVE) ×1 IMPLANT
GOWN STRL REIN XL XLG (GOWN DISPOSABLE) ×3 IMPLANT
GOWN STRL REUS W/ TWL LRG LVL3 (GOWN DISPOSABLE) ×1 IMPLANT
GOWN STRL REUS W/TWL LRG LVL3 (GOWN DISPOSABLE) ×2
NEEDLE HYPO 22GX1.5 SAFETY (NEEDLE) IMPLANT
NS IRRIG 1000ML POUR BTL (IV SOLUTION) ×3 IMPLANT
PACK ARTHROSCOPY DSU (CUSTOM PROCEDURE TRAY) ×3 IMPLANT
PACK BASIN DAY SURGERY FS (CUSTOM PROCEDURE TRAY) ×3 IMPLANT
PAD CAST 3X4 CTTN HI CHSV (CAST SUPPLIES) IMPLANT
PAD CAST 4YDX4 CTTN HI CHSV (CAST SUPPLIES) IMPLANT
PADDING CAST COTTON 3X4 STRL (CAST SUPPLIES)
PADDING CAST COTTON 4X4 STRL (CAST SUPPLIES)
PADDING CAST COTTON 6X4 STRL (CAST SUPPLIES) IMPLANT
PADDING CAST SYN 6 (CAST SUPPLIES)
PADDING CAST SYNTHETIC 4 (CAST SUPPLIES)
PADDING CAST SYNTHETIC 4X4 STR (CAST SUPPLIES) IMPLANT
PADDING CAST SYNTHETIC 6X4 NS (CAST SUPPLIES) IMPLANT
PENCIL BUTTON HOLSTER BLD 10FT (ELECTRODE) ×3 IMPLANT
SLEEVE SCD COMPRESS KNEE MED (MISCELLANEOUS) ×3 IMPLANT
SPLINT FAST PLASTER 5X30 (CAST SUPPLIES)
SPLINT FIBERGLASS 3X35 (CAST SUPPLIES) IMPLANT
SPLINT FIBERGLASS 4X30 (CAST SUPPLIES) IMPLANT
SPLINT PLASTER CAST FAST 5X30 (CAST SUPPLIES) IMPLANT
SPONGE LAP 18X18 X RAY DECT (DISPOSABLE) ×3 IMPLANT
SPONGE LAP 4X18 X RAY DECT (DISPOSABLE) IMPLANT
STAPLER VISISTAT (STAPLE) IMPLANT
STRIP CLOSURE SKIN 1/2X4 (GAUZE/BANDAGES/DRESSINGS) IMPLANT
SUCTION FRAZIER HANDLE 10FR (MISCELLANEOUS) ×2
SUCTION TUBE FRAZIER 10FR DISP (MISCELLANEOUS) ×1 IMPLANT
SUT ETHILON 3 0 PS 1 (SUTURE) ×3 IMPLANT
SUT MNCRL AB 4-0 PS2 18 (SUTURE) IMPLANT
SUT VIC AB 0 CT1 27 (SUTURE) ×2
SUT VIC AB 0 CT1 27XBRD ANBCTR (SUTURE) ×1 IMPLANT
SUT VIC AB 1 CT1 27 (SUTURE) ×2
SUT VIC AB 1 CT1 27XBRD ANBCTR (SUTURE) ×1 IMPLANT
SUT VIC AB 2-0 CT1 27 (SUTURE) ×2
SUT VIC AB 2-0 CT1 TAPERPNT 27 (SUTURE) ×1 IMPLANT
SYR BULB 3OZ (MISCELLANEOUS) ×3 IMPLANT
SYR CONTROL 10ML LL (SYRINGE) IMPLANT
TOWEL OR 17X24 6PK STRL BLUE (TOWEL DISPOSABLE) ×3 IMPLANT
TOWEL OR NON WOVEN STRL DISP B (DISPOSABLE) ×3 IMPLANT
TUBE CONNECTING 20'X1/4 (TUBING) ×1
TUBE CONNECTING 20X1/4 (TUBING) ×2 IMPLANT
UNDERPAD 30X30 (UNDERPADS AND DIAPERS) ×3 IMPLANT
YANKAUER SUCT BULB TIP NO VENT (SUCTIONS) ×3 IMPLANT

## 2016-03-22 NOTE — Anesthesia Postprocedure Evaluation (Signed)
Anesthesia Post Note  Patient: Jasmine Richard  Procedure(s) Performed: Procedure(s) (LRB): LEFT KNEE HARDWARE REMOVAL (Left)  Patient location during evaluation: PACU Anesthesia Type: General Level of consciousness: awake and alert Pain management: pain level controlled Vital Signs Assessment: post-procedure vital signs reviewed and stable Respiratory status: spontaneous breathing, nonlabored ventilation, respiratory function stable and patient connected to nasal cannula oxygen Cardiovascular status: blood pressure returned to baseline and stable Postop Assessment: no signs of nausea or vomiting Anesthetic complications: no    Last Vitals:  Filed Vitals:   03/22/16 1300 03/22/16 1345  BP: 131/76 138/73  Pulse: 81 80  Temp:  36.8 C  Resp: 14 16    Last Pain:  Filed Vitals:   03/22/16 1349  PainSc: 4                  Jerrion Tabbert DAVID

## 2016-03-22 NOTE — H&P (Signed)
PREOPERATIVE H&P  Chief Complaint: left knee symptomatic retained hardware  HPI: Jasmine Richard is a 60 y.o. female who presents for surgical treatment of left knee symptomatic retained hardware.  She denies any changes in medical history.  Past Medical History  Diagnosis Date  . GERD (gastroesophageal reflux disease)   . Psoriatic arthritis (HCC)     Dr. Zenovia Jordan  . Heart murmur   . Depression   . Anxiety   . Headache   . Osteoarthritis   . H/O ETOH abuse     recovered   Past Surgical History  Procedure Laterality Date  . Cesarean section      x2  . Tonsillectomy and adenoidectomy  1960  . Hysteroscopy w/d&c  07    no polyps  . Gastric bypass  2002    lost 140 lb  . Cholecystectomy  1984  . Colonoscopy  2005    recheck in 5 years  . Tarsal metatarsal arthrodesis  11/28/2012    Procedure: TARSAL METATARSAL FUSION;  Surgeon: Sherri Rad, MD;  Location: [facility];  Service: Orthopedics;  Laterality: Right;  right 2nd and 3rd TMT joint fusion local bone graft stress x ray foot   . Ulnar tunnel release  09/2014  . Carpal tunnel release  09/2014  . Tee without cardioversion N/A 01/07/2015    Procedure: TRANSESOPHAGEAL ECHOCARDIOGRAM (TEE);  Surgeon: Lars Masson, MD;  Location: [facility] ENDOSCOPY;  Service: Cardiovascular;  Laterality: N/A;  . Femur closed reduction Left 11/19/2015    Procedure: CLOSED REDUCTION LEFT DISTAL FEMUR;  Surgeon: Beverely Low, MD;  Location: [facility] ORS;  Service: Orthopedics;  Laterality: Left;  . Orif femur fracture Left 11/20/2015    Procedure: OPEN REDUCTION INTERNAL FIXATION (ORIF) DISTAL FEMUR FRACTURE;  Surgeon: Tarry Kos, MD;  Location: [facility] OR;  Service: Orthopedics;  Laterality: Left;   Social History   Social History  . Marital Status: Widowed    Spouse Name: N/A  . Number of Children: 3  . Years of Education: N/A   Social History Main Topics  . Smoking status: Never Smoker   . Smokeless tobacco: Never Used   . Alcohol Use: No     Comment: none since oct 2016  . Drug Use: No  . Sexual Activity: No     Comment: husband passed 06/2012   Other Topics Concern  . None   Social History Narrative   Family History  Problem Relation Age of Onset  . Cervical cancer Mother   . Hypertension Father   . Heart failure Father   . Hypertension Brother     also with hip replacements  . Hyperlipidemia Brother   . Alcohol abuse Paternal Uncle    Allergies  Allergen Reactions  . Methotrexate Derivatives Other (See Comments)    Made liver enzymes spike.   Prior to Admission medications   Medication Sig Start Date End Date Taking? Authorizing Provider  adalimumab (HUMIRA) 40 MG/0.8ML injection Inject 40 mg into the skin every 14 (fourteen) days.   Yes Historical Provider, MD  Calcium Carbonate-Vit D-Min (CALCIUM 1200 PO) Take 1 tablet by mouth 2 (two) times daily.   Yes Historical Provider, MD  cetirizine (ZYRTEC) 10 MG tablet Take 10 mg by mouth daily.   Yes Historical Provider, MD  cyanocobalamin (,VITAMIN B-12,) 1000 MCG/ML injection Inject 1 mL (1,000 mcg total) into the muscle every 30 (thirty) days. 11/03/14  Yes Ria Comment, FNP  escitalopram (LEXAPRO) 20 MG tablet  Take 1 tablet (20 mg total) by mouth daily. 06/09/15 06/08/16 Yes Court Joy, PA-C  fluticasone (FLONASE) 50 MCG/ACT nasal spray Place 1-2 sprays into both nostrils daily.   Yes Historical Provider, MD  HYDROcodone-acetaminophen (NORCO/VICODIN) 5-325 MG tablet Take 1 tablet by mouth every 6 (six) hours as needed for moderate pain.   Yes Historical Provider, MD  MAGNESIUM PO Take 250 mg by mouth 2 (two) times daily.   Yes Historical Provider, MD  meloxicam (MOBIC) 15 MG tablet Take 7.5 mg by mouth 2 (two) times daily.    Yes Historical Provider, MD  methocarbamol (ROBAXIN) 750 MG tablet Take 1 tablet (750 mg total) by mouth 2 (two) times daily as needed for muscle spasms. 11/20/15  Yes Tarry Kos, MD  mirtazapine (REMERON) 15 MG  tablet Take 15 mg by mouth at bedtime.   Yes Historical Provider, MD  Multiple Vitamins-Minerals (MULTIVITAMIN WITH MINERALS) tablet Take 1 tablet by mouth daily.   Yes Historical Provider, MD  omeprazole (PRILOSEC) 20 MG capsule Take 20 mg by mouth 2 (two) times daily before a meal.   Yes Historical Provider, MD  POTASSIUM PO Take 250 mg by mouth 2 (two) times daily.   Yes Historical Provider, MD  SUMAtriptan (IMITREX) 100 MG tablet May repeat in 2 hours if headache persists or recurs. Patient taking differently: Take 100 mg by mouth every 2 (two) hours as needed for migraine. May repeat in 2 hours if headache persists or recurs. 11/03/14  Yes Ria Comment, FNP  vitamin C (ASCORBIC ACID) 500 MG tablet Take 500 mg by mouth 2 (two) times daily.   Yes Historical Provider, MD     Positive ROS: All other systems have been reviewed and were otherwise negative with the exception of those mentioned in the HPI and as above.  Physical Exam: General: Alert, no acute distress Cardiovascular: No pedal edema Respiratory: No cyanosis, no use of accessory musculature GI: abdomen soft Skin: No lesions in the area of chief complaint Neurologic: Sensation intact distally Psychiatric: Patient is competent for consent with normal mood and affect Lymphatic: no lymphedema  MUSCULOSKELETAL: exam stable  Assessment: left knee symptomatic retained hardware  Plan: Plan for Procedure(s): LEFT KNEE HARDWARE REMOVAL  The risks benefits and alternatives were discussed with the patient including but not limited to the risks of nonoperative treatment, versus surgical intervention including infection, bleeding, nerve injury,  blood clots, cardiopulmonary complications, morbidity, mortality, among others, and they were willing to proceed.   Cheral Almas, MD   03/22/2016 8:26 AM

## 2016-03-22 NOTE — Transfer of Care (Signed)
Immediate Anesthesia Transfer of Care Note  Patient: Jasmine Richard  Procedure(s) Performed: Procedure(s): LEFT KNEE HARDWARE REMOVAL (Left)  Patient Location: PACU  Anesthesia Type:General  Level of Consciousness: awake and patient cooperative  Airway & Oxygen Therapy: Patient Spontanous Breathing and Patient connected to face mask oxygen  Post-op Assessment: Report given to RN and Post -op Vital signs reviewed and stable  Post vital signs: Reviewed and stable  Last Vitals:  Filed Vitals:   03/22/16 1203 03/22/16 1204  BP: 147/63   Pulse:  89  Temp:    Resp: 12 18    Last Pain:  Filed Vitals:   03/22/16 1206  PainSc: 10-Worst pain ever      Patients Stated Pain Goal: 2 (03/22/16 0957)  Complications: No apparent anesthesia complications

## 2016-03-22 NOTE — Discharge Instructions (Signed)
Postoperative instructions:  Weightbearing: as tolerated  Keep your dressing and/or splint clean and dry at all times.  You can remove your dressing on post-operative day #3 and change with a dry/sterile dressing or Band-Aids as needed thereafter.    Incision instructions:  Do not soak your incision for 3 weeks after surgery.  If the incision gets wet, pat dry and do not scrub the incision.  Pain control:  You have been given a prescription to be taken as directed for post-operative pain control.  In addition, elevate the operative extremity above the heart at all times to prevent swelling and throbbing pain.  Take over-the-counter Colace, 100mg  by mouth twice a day while taking narcotic pain medications to help prevent constipation.  Follow up appointments: 1) 14 days for suture removal and wound check. 2) Dr. as scheduled.   -------------------------------------------------------------------------------------------------------------  After Surgery Pain Control:  After your surgery, post-surgical discomfort or pain is likely. This discomfort can last several days to a few weeks. At certain times of the day your discomfort may be more intense.  Did you receive a nerve block?  A nerve block can provide pain relief for one hour to two days after your surgery. As long as the nerve block is working, you will experience little or no sensation in the area the surgeon operated on.  As the nerve block wears off, you will begin to experience pain or discomfort. It is very important that you begin taking your prescribed pain medication before the nerve block fully wears off. Treating your pain at the first sign of the block wearing off will ensure your pain is better controlled and more tolerable when full-sensation returns. Do not wait until the pain is intolerable, as the medicine will be less effective. It is better to treat pain in advance than to try and catch up.  General Anesthesia:  If you  did not receive a nerve block during your surgery, you will need to start taking your pain medication shortly after your surgery and should continue to do so as prescribed by your surgeon.  Pain Medication:  Most commonly we prescribe Vicodin and Percocet for post-operative pain. Both of these medications contain a combination of acetaminophen (Tylenol) and a narcotic to help control pain.   It takes between 30 and 45 minutes before pain medication starts to work. It is important to take your medication before your pain level gets too intense.   Nausea is a common side effect of many pain medications. You will want to eat something before taking your pain medicine to help prevent nausea.   If you are taking a prescription pain medication that contains acetaminophen, we recommend that you do not take additional over the counter acetaminophen (Tylenol).  Other pain relieving options:   Using a cold pack to ice the affected area a few times a day (15 to 20 minutes at a time) can help to relieve pain, reduce swelling and bruising.   Elevation of the affected area can also help to reduce pain and swelling.     Post Anesthesia Home Care Instructions  Activity: Get plenty of rest for the remainder of the day. A responsible adult should stay with you for 24 hours following the procedure.  For the next 24 hours, DO NOT: -Drive a car -Roda Shutters -Drink alcoholic beverages -Take any medication unless instructed by your physician -Make any legal decisions or sign important papers.  Meals: Start with liquid foods such as gelatin or soup.  Progress to regular foods as tolerated. Avoid greasy, spicy, heavy foods. If nausea and/or vomiting occur, drink only clear liquids until the nausea and/or vomiting subsides. Call your physician if vomiting continues.  Special Instructions/Symptoms: Your throat may feel dry or sore from the anesthesia or the breathing tube placed in your throat during  surgery. If this causes discomfort, gargle with warm salt water. The discomfort should disappear within 24 hours.  If you had a scopolamine patch placed behind your ear for the management of post- operative nausea and/or vomiting:  1. The medication in the patch is effective for 72 hours, after which it should be removed.  Wrap patch in a tissue and discard in the trash. Wash hands thoroughly with soap and water. 2. You may remove the patch earlier than 72 hours if you experience unpleasant side effects which may include dry mouth, dizziness or visual disturbances. 3. Avoid touching the patch. Wash your hands with soap and water after contact with the patch.

## 2016-03-22 NOTE — Op Note (Signed)
   Date of Surgery: 03/22/2016  INDICATIONS: Jasmine Richard is a 60 y.o.-year-old female with a left distal femur symptomatic hardware;  The patient did consent to the procedure after discussion of the risks and benefits.  PREOPERATIVE DIAGNOSIS: Left distal femur symptomatic hardware  POSTOPERATIVE DIAGNOSIS: Same.  PROCEDURE: Removal of deep screw from distal femur plate of left knee  SURGEON: N. Glee Arvin, [name]  ASSIST: none.  ANESTHESIA:  general  IV FLUIDS AND URINE: See anesthesia.  ESTIMATED BLOOD LOSS: minimal mL.  IMPLANTS: none  DRAINS: none  COMPLICATIONS: None.  DESCRIPTION OF PROCEDURE: The patient was brought to the operating room and placed supine on the operating table.  The patient had been signed prior to the procedure and this was documented. The patient had the anesthesia placed by the anesthesiologist.  A time-out was performed to confirm that this was the correct patient, site, side and location. The patient did receive antibiotics prior to the incision and was re-dosed during the procedure as needed at indicated intervals.  A tourniquet placed and inflated to 300 mmHg..  The patient had the operative extremity prepped and draped in the standard surgical fashion.    We opened up about the distal half of the previous incision. Blunt dissection was taken to the IT band. IT band was sharply incised. This was elevated off of the plate. The symptomatic screw was identified using fluoroscopy and confirmed on the CT scan. This was then carefully backed out. Fluoroscopy was used to confirm appropriate removal of the screw. The wound was thoroughly irrigated with 3 L of normal saline. The wound was closed in layer fashion using #1 Vicryl for the IT band, 0 Vicryl for the subcutaneous fat, 2-0 Vicryl for the deep skin layer, 3-0 nylon for the skin. Sterile dressings were applied. Compression Ace bandage was applied. Patient tolerated procedure well and no immediate  complications.  POSTOPERATIVE PLAN: Patient will be weightbearing as tolerated and discharge home.  Jasmine Reel, MD [facility] [phone] 11:53 AM

## 2016-03-22 NOTE — Anesthesia Procedure Notes (Signed)
Procedure Name: LMA Insertion Date/Time: 03/22/2016 10:55 AM Performed by: Genevieve Norlander L Pre-anesthesia Checklist: Patient identified, Emergency Drugs available, Suction available, Patient being monitored and Timeout performed Patient Re-evaluated:Patient Re-evaluated prior to inductionOxygen Delivery Method: Circle System Utilized Preoxygenation: Pre-oxygenation with 100% oxygen Intubation Type: IV induction Ventilation: Mask ventilation without difficulty LMA: LMA inserted LMA Size: 4.0 Number of attempts: 1 Airway Equipment and Method: Bite block Placement Confirmation: positive ETCO2 Tube secured with: Tape Dental Injury: Teeth and Oropharynx as per pre-operative assessment

## 2016-03-22 NOTE — Anesthesia Preprocedure Evaluation (Addendum)
Anesthesia Evaluation  Patient identified by MRN, date of birth, ID band Patient awake    Reviewed: Allergy & Precautions, NPO status , Patient's Chart, lab work & pertinent test results  Airway Mallampati: I  TM Distance: >3 FB Neck ROM: Full    Dental   Pulmonary    Pulmonary exam normal        Cardiovascular Normal cardiovascular exam  ECHO 12/2014  Study Conclusions  - Left ventricle: The cavity size was normal. Wall thickness was normal. Systolic function was vigorous. The estimated ejection fraction was in the range of 65% to 70%. Wall motion was normal; there were no regional wall motion abnormalities. - Aortic valve: No evidence of vegetation. There was trivial regurgitation. - Mitral valve: No evidence of vegetation. - Left atrium: No evidence of thrombus in the atrial cavity or appendage. No evidence of thrombus in the atrial cavity or appendage. - Right atrium: No evidence of thrombus in the atrial cavity or appendage. No evidence of thrombus in the atrial cavity or appendage. - Atrial septum: There was increased thickness of the septum, consistent with lipomatous hypertrophy. - Tricuspid valve: No evidence of vegetation. - Pulmonic valve: No evidence of vegetation.  Impressions:  - Normal study. This is a normal study. No subvalvular membrane was identified. There is hyperdynamic LV function and mild thickening of the basal septum that might be adding to midly increased transortic velocities. In the future if worsening gradient a betablocker might be considered.    Neuro/Psych Anxiety Depression    GI/Hepatic GERD  Medicated and Controlled,  Endo/Other    Renal/GU      Musculoskeletal   Abdominal   Peds  Hematology   Anesthesia Other Findings   Reproductive/Obstetrics                           Anesthesia Physical Anesthesia Plan  ASA:  II  Anesthesia Plan: General   Post-op Pain Management:    Induction: Intravenous  Airway Management Planned: LMA  Additional Equipment:   Intra-op Plan:   Post-operative Plan: Extubation in OR  Informed Consent: I have reviewed the patients History and Physical, chart, labs and discussed the procedure including the risks, benefits and alternatives for the proposed anesthesia with the patient or authorized representative who has indicated his/her understanding and acceptance.     Plan Discussed with: CRNA and Surgeon  Anesthesia Plan Comments:         Anesthesia Quick Evaluation

## 2016-03-23 ENCOUNTER — Encounter (HOSPITAL_BASED_OUTPATIENT_CLINIC_OR_DEPARTMENT_OTHER): Payer: Self-pay | Admitting: Orthopaedic Surgery

## 2016-03-23 ENCOUNTER — Ambulatory Visit: Admitting: Psychiatry

## 2016-03-30 ENCOUNTER — Ambulatory Visit (INDEPENDENT_AMBULATORY_CARE_PROVIDER_SITE_OTHER): Admitting: Psychiatry

## 2016-03-30 DIAGNOSIS — F1020 Alcohol dependence, uncomplicated: Secondary | ICD-10-CM | POA: Diagnosis not present

## 2016-03-30 DIAGNOSIS — F4323 Adjustment disorder with mixed anxiety and depressed mood: Secondary | ICD-10-CM | POA: Diagnosis not present

## 2016-03-31 ENCOUNTER — Encounter: Payer: Self-pay | Admitting: *Deleted

## 2016-04-07 ENCOUNTER — Other Ambulatory Visit (INDEPENDENT_AMBULATORY_CARE_PROVIDER_SITE_OTHER)

## 2016-04-07 ENCOUNTER — Ambulatory Visit (INDEPENDENT_AMBULATORY_CARE_PROVIDER_SITE_OTHER): Admitting: Gastroenterology

## 2016-04-07 ENCOUNTER — Encounter: Payer: Self-pay | Admitting: Gastroenterology

## 2016-04-07 VITALS — BP 130/70 | HR 84 | Ht 64.25 in | Wt 185.0 lb

## 2016-04-07 DIAGNOSIS — R748 Abnormal levels of other serum enzymes: Secondary | ICD-10-CM

## 2016-04-07 DIAGNOSIS — Z1211 Encounter for screening for malignant neoplasm of colon: Secondary | ICD-10-CM | POA: Diagnosis not present

## 2016-04-07 LAB — CBC WITH DIFFERENTIAL/PLATELET
BASOS ABS: 0.1 10*3/uL (ref 0.0–0.1)
Basophils Relative: 0.9 % (ref 0.0–3.0)
Eosinophils Absolute: 0.3 10*3/uL (ref 0.0–0.7)
Eosinophils Relative: 5 % (ref 0.0–5.0)
HEMATOCRIT: 39 % (ref 36.0–46.0)
HEMOGLOBIN: 13.2 g/dL (ref 12.0–15.0)
Lymphocytes Relative: 37 % (ref 12.0–46.0)
Lymphs Abs: 2.3 10*3/uL (ref 0.7–4.0)
MCHC: 33.8 g/dL (ref 30.0–36.0)
MCV: 96.4 fl (ref 78.0–100.0)
Monocytes Absolute: 0.6 10*3/uL (ref 0.1–1.0)
Monocytes Relative: 10.2 % (ref 3.0–12.0)
Neutro Abs: 2.9 10*3/uL (ref 1.4–7.7)
Neutrophils Relative %: 46.9 % (ref 43.0–77.0)
PLATELETS: 337 10*3/uL (ref 150.0–400.0)
RBC: 4.04 Mil/uL (ref 3.87–5.11)
RDW: 17.9 % — ABNORMAL HIGH (ref 11.5–15.5)
WBC: 6.2 10*3/uL (ref 4.0–10.5)

## 2016-04-07 LAB — COMPREHENSIVE METABOLIC PANEL
ALK PHOS: 102 U/L (ref 39–117)
ALT: 9 U/L (ref 0–35)
AST: 16 U/L (ref 0–37)
Albumin: 4.3 g/dL (ref 3.5–5.2)
BILIRUBIN TOTAL: 0.6 mg/dL (ref 0.2–1.2)
BUN: 15 mg/dL (ref 6–23)
CALCIUM: 9.6 mg/dL (ref 8.4–10.5)
CO2: 28 meq/L (ref 19–32)
CREATININE: 0.62 mg/dL (ref 0.40–1.20)
Chloride: 103 mEq/L (ref 96–112)
GFR: 103.87 mL/min (ref >60.00)
Glucose, Bld: 86 mg/dL (ref 70–99)
Potassium: 4.3 mEq/L (ref 3.5–5.1)
Sodium: 138 mEq/L (ref 135–145)
TOTAL PROTEIN: 7.3 g/dL (ref 6.0–8.3)

## 2016-04-07 LAB — PROTIME-INR
INR: 0.9 ratio (ref 0.8–1.0)
PROTHROMBIN TIME: 9.8 s (ref 9.6–13.1)

## 2016-04-07 MED ORDER — NA SULFATE-K SULFATE-MG SULF 17.5-3.13-1.6 GM/177ML PO SOLN
1.0000 | Freq: Once | ORAL | Status: DC
Start: 2016-04-07 — End: 2016-04-25

## 2016-04-07 NOTE — Progress Notes (Signed)
HPI :  60 y/o female here for consult from Dr. Pete Glatter for elevated liver enzymes. She has a history of psoriatic arthritis on Humira at present time.   Patient was noted to have markedly elevated AST/ALT back in September, values as below. AST > 12K and ALT at 3K, then downtrended 5 days later. Bilirubin normal. She has been on Humira and methotrexate for psoriatic arthritis for 5-6 years or so. She admitted to drinking heavily at the time in September, roughly bottle of wine per night. She thinks she has been has been drinking heavily for 3 years since her husband passed away. She has been through rehab and stopped drinking completely for several months. She stopped methotrexate when her liver enzymes were elevated.  RUQ [place] was normal. She has no history of liver disease. No FH of liver disease. No FH of liver cancer. No history of jaundice. No history of muscle disorder or rhabdomyolosis. She thinks it possible she was taking tylenol when her liver enzymes were quite elevated in the setting of alcohol use. No herbal supplements otherwise.   Patient had a fall and broken leg occurred in December 23rd for which she was hospitalized for 5 days, do not see any LFTs at that time, not done sine September.   She otherwise had had some trouble with loose stools which started when her husband passed away 3 years ago. She had a lot of depresison and anxiety and is being followed for this, which has improved recently. She reports over time this has since resolved for the most part, sometimes she has some loose stools occasionally, but for the most part no issues and now has regular bowel habits. Mother had Crohns disease. NO blood in the stools. Last colonoscopy in 2007.  She takes mobic once daily for arthritis. She takes prilosec once daily for GERD, which she thinks controls her symptoms well. She is taking hydrocodone 3 times pe rday  Prior labs: 08/25/15 - ALT  749, AST  129, T bil 1.4, AP 51,    08/20/15 - AST 12,570, ALT 3050  Colonoscopy 2007 - diverticulosis, rectal granular mucosa s.p biopsies, benign  Past Medical History  Diagnosis Date  . GERD (gastroesophageal reflux disease)   . Psoriatic arthritis (HCC)     Dr. Zenovia Jordan  . Heart murmur   . Depression   . Anxiety   . Headache   . Osteoarthritis   . H/O ETOH abuse     recovered  . Diverticulosis   . IDA (iron deficiency anemia)   . Insomnia   . ADD (attention deficit disorder)      Past Surgical History  Procedure Laterality Date  . Cesarean section      x2  . Tonsillectomy and adenoidectomy  1960  . Hysteroscopy w/d&c  07    no polyps  . Gastric bypass  2002    lost 140 lb  . Cholecystectomy  1984  . Colonoscopy  2005    recheck in 5 years  . Tarsal metatarsal arthrodesis  11/28/2012    Procedure: TARSAL METATARSAL FUSION;  Surgeon: Sherri Rad, MD;  Location: [facility];  Service: Orthopedics;  Laterality: Right;  right 2nd and 3rd TMT joint fusion local bone graft stress x ray foot   . Ulnar tunnel release  09/2014  . Carpal tunnel release  09/2014  . Tee without cardioversion N/A 01/07/2015    Procedure: TRANSESOPHAGEAL ECHOCARDIOGRAM (TEE);  Surgeon: Lars Masson, MD;  Location:  [facility];  Service: Cardiovascular;  Laterality: N/A;  . Femur closed reduction Left 11/19/2015    Procedure: CLOSED REDUCTION LEFT DISTAL FEMUR;  Surgeon: Beverely Low, MD;  Location: [facility];  Service: Orthopedics;  Laterality: Left;  . Orif femur fracture Left 11/20/2015    Procedure: OPEN REDUCTION INTERNAL FIXATION (ORIF) DISTAL FEMUR FRACTURE;  Surgeon: Tarry Kos, MD;  Location: [facility];  Service: Orthopedics;  Laterality: Left;  . Hardware removal Left 03/22/2016    Procedure: LEFT KNEE HARDWARE REMOVAL;  Surgeon: Tarry Kos, MD;  Location: [facility];  Service: Orthopedics;  Laterality: Left;   Family History  Problem Relation Age of Onset  . Cervical cancer Mother    . Hypertension Father   . Heart failure Father   . Hypertension Brother     also with hip replacements  . Hyperlipidemia Brother   . Alcohol abuse Paternal Uncle   . Ulcerative colitis Mother    Social History  Substance Use Topics  . Smoking status: Never Smoker   . Smokeless tobacco: Never Used  . Alcohol Use: No     Comment: none since oct 2016   Current Outpatient Prescriptions  Medication Sig Dispense Refill  . adalimumab (HUMIRA) 40 MG/0.8ML injection Inject 40 mg into the skin every 14 (fourteen) days.    . Calcium Carbonate-Vit D-Min (CALCIUM 1200 PO) Take 1 tablet by mouth 2 (two) times daily.    . cetirizine (ZYRTEC) 10 MG tablet Take 10 mg by mouth daily.    . cyanocobalamin (,VITAMIN B-12,) 1000 MCG/ML injection Inject 1 mL (1,000 mcg total) into the muscle every 30 (thirty) days. 10 mL 1  . escitalopram (LEXAPRO) 20 MG tablet Take 1 tablet (20 mg total) by mouth daily. 30 tablet 2  . fluticasone (FLONASE) 50 MCG/ACT nasal spray Place 1-2 sprays into both nostrils daily.    Marland Kitchen HYDROcodone-acetaminophen (NORCO) 7.5-325 MG tablet Take 1-2 tablets by mouth every 6 (six) hours as needed for moderate pain. 90 tablet 0  . HYDROcodone-acetaminophen (NORCO/VICODIN) 5-325 MG tablet Take 1 tablet by mouth every 6 (six) hours as needed for moderate pain.    Marland Kitchen MAGNESIUM PO Take 250 mg by mouth 2 (two) times daily.    . meloxicam (MOBIC) 15 MG tablet Take 7.5 mg by mouth 2 (two) times daily.     . methocarbamol (ROBAXIN) 750 MG tablet Take 1 tablet (750 mg total) by mouth 2 (two) times daily as needed for muscle spasms. 60 tablet 0  . methocarbamol (ROBAXIN) 750 MG tablet Take 1 tablet (750 mg total) by mouth 2 (two) times daily as needed for muscle spasms. 60 tablet 0  . mirtazapine (REMERON) 15 MG tablet Take 15 mg by mouth at bedtime.    . Multiple Vitamins-Minerals (MULTIVITAMIN WITH MINERALS) tablet Take 1 tablet by mouth daily.    Marland Kitchen omeprazole (PRILOSEC) 20 MG capsule Take 20 mg  by mouth 2 (two) times daily before a meal.    . ondansetron (ZOFRAN) 4 MG tablet Take 1-2 tablets (4-8 mg total) by mouth every 8 (eight) hours as needed for nausea or vomiting. 40 tablet 0  . POTASSIUM PO Take 250 mg by mouth 2 (two) times daily.    . SUMAtriptan (IMITREX) 100 MG tablet May repeat in 2 hours if headache persists or recurs. (Patient taking differently: Take 100 mg by mouth every 2 (two) hours as needed for migraine. May repeat in 2 hours if headache persists or recurs.) 10 tablet 5  .  vitamin C (ASCORBIC ACID) 500 MG tablet Take 500 mg by mouth 2 (two) times daily.     No current facility-administered medications for this visit.   Allergies  Allergen Reactions  . Methotrexate Derivatives Other (See Comments)    Made liver enzymes spike.  . Sulfa Antibiotics Hives     Review of Systems: All systems reviewed and negative except where noted in HPI.      Physical Exam: BP 130/70 mmHg  Pulse 84  Ht 5' 4.25" (1.632 m)  Wt 185 lb (83.915 kg)  BMI 31.51 kg/m2  LMP 04/19/2011 Constitutional: Pleasant,well-developed, female in no acute distress. HEENT: Normocephalic and atraumatic. Conjunctivae are normal. No scleral icterus. Neck supple.  Cardiovascular: Normal rate, regular rhythm. 2/6 SEM Pulmonary/chest: Effort normal and breath sounds normal. No wheezing, rales or rhonchi. Abdominal: Soft, nondistended, nontender. Bowel sounds active throughout. There are no masses palpable. No hepatomegaly. Extremities: trace edema Lymphadenopathy: No cervical adenopathy noted. Neurological: Alert and oriented to person place and time. Skin: Skin is warm and dry. No rashes noted. Psychiatric: Normal mood and affect. Behavior is normal.   ASSESSMENT AND PLAN: 60 y/o female with a history of psoriatic arthritis on Humira and methotrexate previously, with significant alcohol use, who presented in September with AST > 12K and ALT > 3K, which then downtrended 5 days later but no  labs since that time. While she did drink heavily at the time, her elevated enzymes are much higher than would expect with purely alcoholic hepatitis. 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 Rhebdomyolysis could also cause this picture but she denied any muscle soreness at the time. [place] was normal at the time. She is fortunate this resolved on its own given the marked elevation, she was at risk for liver failure. I repeat her liver enzymes today and they are completely normal. She will not be going back on methotrexate and states she is completely abstinent from alcohol. I will check for hep B, C, and immunity to A to ensure okay and will let her know the results. I will repeat LFTs again in 3 months to ensure they remain stable. If they are persistently normal then I don't think we need to workup for chronic liver diseases. She agreed.   Otherwise suspect she has some baseline IBS which is mild and not bothering her now, however she is due for colon cancer screening. The indications, risks, and benefits of colonoscopy were explained to the patient in detail. Risks include but are not limited to bleeding, perforation, adverse reaction to medications, and cardiopulmonary compromise. Sequelae include but are not limited to the possibility of surgery, hospitalization, and mortality. The patient verbalized understanding and wished to proceed. All questions answered, referred to the scheduler and bowel prep ordered. Further recommendations pending results of the exam.   Ileene Patrick, MD [facility] Pager [phone]  CC: Merlene Laughter, MD

## 2016-04-07 NOTE — Patient Instructions (Signed)

## 2016-04-08 LAB — HEPATITIS C ANTIBODY: HCV AB: NEGATIVE

## 2016-04-08 LAB — HEPATITIS A ANTIBODY, TOTAL: HEP A TOTAL AB: REACTIVE — AB

## 2016-04-08 LAB — HEPATITIS B SURFACE ANTIBODY,QUALITATIVE: HEP B S AB: NEGATIVE

## 2016-04-08 LAB — HEPATITIS B SURFACE ANTIGEN: HEP B S AG: NEGATIVE

## 2016-04-10 ENCOUNTER — Other Ambulatory Visit: Payer: Self-pay | Admitting: *Deleted

## 2016-04-10 DIAGNOSIS — R7989 Other specified abnormal findings of blood chemistry: Secondary | ICD-10-CM

## 2016-04-10 DIAGNOSIS — R945 Abnormal results of liver function studies: Principal | ICD-10-CM

## 2016-04-11 ENCOUNTER — Encounter: Payer: Self-pay | Admitting: Gastroenterology

## 2016-04-11 ENCOUNTER — Ambulatory Visit (INDEPENDENT_AMBULATORY_CARE_PROVIDER_SITE_OTHER): Admitting: Gastroenterology

## 2016-04-11 DIAGNOSIS — R945 Abnormal results of liver function studies: Principal | ICD-10-CM

## 2016-04-11 DIAGNOSIS — Z23 Encounter for immunization: Secondary | ICD-10-CM

## 2016-04-11 DIAGNOSIS — R7989 Other specified abnormal findings of blood chemistry: Secondary | ICD-10-CM | POA: Diagnosis not present

## 2016-04-25 ENCOUNTER — Encounter: Payer: Self-pay | Admitting: Gastroenterology

## 2016-04-25 ENCOUNTER — Ambulatory Visit (AMBULATORY_SURGERY_CENTER): Admitting: Gastroenterology

## 2016-04-25 VITALS — BP 124/67 | HR 77 | Temp 97.7°F | Resp 15 | Ht 64.25 in | Wt 185.0 lb

## 2016-04-25 DIAGNOSIS — Z1211 Encounter for screening for malignant neoplasm of colon: Secondary | ICD-10-CM

## 2016-04-25 MED ORDER — SODIUM CHLORIDE 0.9 % IV SOLN: 500.0000 mL | INTRAVENOUS | Status: DC

## 2016-04-25 NOTE — Progress Notes (Signed)
Pt required several IV sedation medications for comfort  Difficult to sedate with just IV Diprivan Stable to RR

## 2016-04-25 NOTE — Patient Instructions (Signed)
YOU HAD AN ENDOSCOPIC PROCEDURE TODAY AT THE [facility]:   Refer to the procedure report that was given to you for any specific questions about what was found during the examination.  If the procedure report does not answer your questions, please call your gastroenterologist to clarify.  If you requested that your care partner not be given the details of your procedure findings, then the procedure report has been included in a sealed envelope for you to review at your convenience later.  YOU SHOULD EXPECT: Some feelings of bloating in the abdomen. Passage of more gas than usual.  Walking can help get rid of the air that was put into your GI tract during the procedure and reduce the bloating. If you had a lower endoscopy (such as a colonoscopy or flexible sigmoidoscopy) you may notice spotting of blood in your stool or on the toilet paper. If you underwent a bowel prep for your procedure, you may not have a normal bowel movement for a few days.  Please Note:  You might notice some irritation and congestion in your nose or some drainage.  This is from the oxygen used during your procedure.  There is no need for concern and it should clear up in a day or so.  SYMPTOMS TO REPORT IMMEDIATELY:   Following lower endoscopy (colonoscopy or flexible sigmoidoscopy):  Excessive amounts of blood in the stool  Significant tenderness or worsening of abdominal pains  Swelling of the abdomen that is new, acute  Fever of 100F or higher  For urgent or emergent issues, a gastroenterologist can be reached at any hour by calling [phone].   DIET: Your first meal following the procedure should be a small meal and then it is ok to progress to your normal diet. Heavy or fried foods are harder to digest and may make you feel nauseous or bloated.  Likewise, meals heavy in dairy and vegetables can increase bloating.  Drink plenty of fluids but you should avoid alcoholic beverages for 24  hours.  ACTIVITY:  You should plan to take it easy for the rest of today and you should NOT DRIVE or use heavy machinery until tomorrow (because of the sedation medicines used during the test).    FOLLOW UP: Our staff will call the number listed on your records the next business day following your procedure to check on you and address any questions or concerns that you may have regarding the information given to you following your procedure. If we do not reach you, we will leave a message.  However, if you are feeling well and you are not experiencing any problems, there is no need to return our call.  We will assume that you have returned to your regular daily activities without incident.  If any biopsies were taken you will be contacted by phone or by letter within the next 1-3 weeks.  Please call us at [phone] if you have not heard about the biopsies in 3 weeks.    SIGNATURES/CONFIDENTIALITY: You and/or your care partner have signed paperwork which will be entered into your electronic medical record.  These signatures attest to the fact that that the information above on your After Visit Summary has been reviewed and is understood.  Full responsibility of the confidentiality of this discharge information lies with you and/or your care-partner.  Please read diverticulosis and high fiber diet handouts provided. Next colonoscopy 10 years.

## 2016-04-25 NOTE — Op Note (Signed)
[facility] Patient Name: Jasmine Richard Procedure Date: 04/25/2016 7:49 AM MRN: [id number] Endoscopist: Viviann Spare P. Adela Lank , MD Age: 60 Referring MD:  Date of Birth: 06-03-1956 Gender: Female Procedure:                Colonoscopy Indications:              Screening for malignant neoplasm in the colon Medicines:                Monitored Anesthesia Care Procedure:                Pre-Anesthesia Assessment:                           - Prior to the procedure, a History and Physical                            was performed, and patient medications and                            allergies were reviewed. The patient's tolerance of                            previous anesthesia was also reviewed. The risks                            and benefits of the procedure and the sedation                            options and risks were discussed with the patient.                            All questions were answered, and informed consent                            was obtained. Prior Anticoagulants: The patient has                            taken no previous anticoagulant or antiplatelet                            agents. ASA Grade Assessment: II - A patient with                            mild systemic disease. After reviewing the risks                            and benefits, the patient was deemed in                            satisfactory condition to undergo the procedure.                           After obtaining informed consent, the colonoscope  was passed under direct vision. Throughout the                            procedure, the patient's blood pressure, pulse, and                            oxygen saturations were monitored continuously. The                            Model PCF-H190L [id number]) scope was introduced                            through the anus and advanced to the the cecum,                            identified by appendiceal  orifice and ileocecal                            valve. The patient tolerated the procedure well.                            The colonoscopy was technically difficult and                            complex due to restricted mobility of the colon.                            The quality of the bowel preparation was adequate.                            The ileocecal valve, appendiceal orifice, and                            rectum were photographed. Scope In: 8:02:46 AM Scope Out: 8:19:09 AM Scope Withdrawal Time: 0 hours 11 minutes 8 seconds  Total Procedure Duration: 0 hours 16 minutes 23 seconds  Findings:                 The perianal and digital rectal examinations were                            normal.                           Many medium-mouthed diverticula were found in the                            left colon. There were angulated turns and                            restricted mobility of the sigmoid colon in areas                            of the diverticulum.  The exam was otherwise without abnormality on                            direct and retroflexion views. No polyps or mass                            lesions noted. Complications:            No immediate complications. Estimated blood loss:                            None. Estimated Blood Loss:     Estimated blood loss: none. Impression:               - Diverticulosis in the left colon.                           - The examination was otherwise normal on direct                            and retroflexion views. No polyps.                           - No specimens collected. Recommendation:           - Patient has a contact number available for                            emergencies. The signs and symptoms of potential                            delayed complications were discussed with the                            patient. Return to normal activities tomorrow.                            Written  discharge instructions were provided to the                            patient.                           - Resume previous diet.                           - Continue present medications.                           - Repeat colonoscopy in 10 years for screening                            purposes. Viviann Spare P. Angelyse Heslin, MD 04/25/2016 8:23:11 AM This report has been signed electronically.

## 2016-04-26 ENCOUNTER — Telehealth: Payer: Self-pay

## 2016-04-26 NOTE — Telephone Encounter (Signed)
  Follow up Call-  Call back number 04/25/2016  Post procedure Call Back phone  # [phone]  Permission to leave phone message Yes     Patient questions:  Do you have a fever, pain , or abdominal swelling? No. Pain Score  0 *  Have you tolerated food without any problems? Yes.    Have you been able to return to your normal activities? Yes.    Do you have any questions about your discharge instructions: Diet   No. Medications  No. Follow up visit  No.  Do you have questions or concerns about your Care? No.  Actions: * If pain score is 4 or above: No action needed, pain <4.

## 2016-05-10 ENCOUNTER — Ambulatory Visit (INDEPENDENT_AMBULATORY_CARE_PROVIDER_SITE_OTHER): Admitting: Gastroenterology

## 2016-05-10 DIAGNOSIS — Z23 Encounter for immunization: Secondary | ICD-10-CM | POA: Diagnosis not present

## 2016-05-10 DIAGNOSIS — R7989 Other specified abnormal findings of blood chemistry: Secondary | ICD-10-CM | POA: Diagnosis not present

## 2016-05-10 DIAGNOSIS — R945 Abnormal results of liver function studies: Principal | ICD-10-CM

## 2016-05-18 ENCOUNTER — Other Ambulatory Visit: Payer: Self-pay | Admitting: Orthopedic Surgery

## 2016-05-18 DIAGNOSIS — M1732 Unilateral post-traumatic osteoarthritis, left knee: Secondary | ICD-10-CM

## 2016-05-19 ENCOUNTER — Ambulatory Visit
Admission: RE | Admit: 2016-05-19 | Discharge: 2016-05-19 | Disposition: A | Discharge status: Home / Self Care | Source: Ambulatory Visit | Attending: Orthopedic Surgery | Admitting: Orthopedic Surgery

## 2016-05-19 DIAGNOSIS — M1732 Unilateral post-traumatic osteoarthritis, left knee: Secondary | ICD-10-CM

## 2016-05-24 ENCOUNTER — Ambulatory Visit (INDEPENDENT_AMBULATORY_CARE_PROVIDER_SITE_OTHER): Admitting: Nurse Practitioner

## 2016-05-24 ENCOUNTER — Encounter: Payer: Self-pay | Admitting: Nurse Practitioner

## 2016-05-24 VITALS — BP 110/62 | HR 72 | Resp 14 | Ht 63.75 in | Wt 193.2 lb

## 2016-05-24 DIAGNOSIS — L4050 Arthropathic psoriasis, unspecified: Secondary | ICD-10-CM | POA: Diagnosis not present

## 2016-05-24 DIAGNOSIS — Z01419 Encounter for gynecological examination (general) (routine) without abnormal findings: Secondary | ICD-10-CM | POA: Diagnosis not present

## 2016-05-24 DIAGNOSIS — Z0000 Encounter for general adult medical examination without abnormal findings: Secondary | ICD-10-CM | POA: Diagnosis not present

## 2016-05-24 NOTE — Progress Notes (Signed)
Encounter reviewed by Dr. Brook Amundson C. Silva.  

## 2016-05-24 NOTE — Patient Instructions (Signed)

## 2016-05-24 NOTE — Progress Notes (Signed)
60 y.o. G68P3003 Widowed  Caucasian Fe here for annual exam.  Larey Seat and broke left leg with compound fracture with ORIF on 11/20/15. Recent anemia and given blood transfusion.   Last October spent time in [facility] for 2 month. She just struggled with loss of husband and was out of control with use of ETOH.  The children sold her big house and now in a townhouse. 2 of her children are barely speaking to her since all this has happened.  The oldest son is in [place] and has a small child and she feels this is part of the problems.  The daughter  Who lives locally will not speak to her from being very angry.  Middle son who is pharmacist has been very good to her.  Patient's last menstrual period was 04/19/2011.          Sexually active: No.  The current method of family planning is post menopausal status.    Exercising: No.  Not since fall in Dec 2016.  Smoker:  no  Health Maintenance: Pap:  11/03/14 WNL neg HR HPV MMG: 08/13/14 BIRADS1 Colonoscopy: 04/25/16 normal with mild diverticulitis repeat 10 yrs BMD: 11/12/14-Osteopenia TDaP:  10/2015 Shingles: Never Pneumonia: 2014 Hep C and HIV: 04/25/16 Labs: PCP   Hgb: 13.9   reports that she has never smoked. She has never used smokeless tobacco. She reports that she does not drink alcohol or use illicit drugs.  Past Medical History  Diagnosis Date  . GERD (gastroesophageal reflux disease)   . Psoriatic arthritis (HCC)     Dr. Zenovia Jordan  . Heart murmur   . Depression   . Anxiety   . Headache   . Osteoarthritis   . H/O ETOH abuse     recovered  . Diverticulosis   . IDA (iron deficiency anemia)   . Insomnia   . ADD (attention deficit disorder)     Past Surgical History  Procedure Laterality Date  . Cesarean section      x2  . Tonsillectomy and adenoidectomy  1960  . Hysteroscopy w/d&c  07    no polyps  . Gastric bypass  2002    lost 140 lb  . Cholecystectomy  1984  . Colonoscopy  2005    recheck in 5 years  . Tarsal  metatarsal arthrodesis  11/28/2012    Procedure: TARSAL METATARSAL FUSION;  Surgeon: Sherri Rad, MD;  Location: [facility];  Service: Orthopedics;  Laterality: Right;  right 2nd and 3rd TMT joint fusion local bone graft stress x ray foot   . Ulnar tunnel release  09/2014  . Carpal tunnel release  09/2014  . Tee without cardioversion N/A 01/07/2015    Procedure: TRANSESOPHAGEAL ECHOCARDIOGRAM (TEE);  Surgeon: Lars Masson, MD;  Location: [facility];  Service: Cardiovascular;  Laterality: N/A;  . Femur closed reduction Left 11/19/2015    Procedure: CLOSED REDUCTION LEFT DISTAL FEMUR;  Surgeon: Beverely Low, MD;  Location: [facility];  Service: Orthopedics;  Laterality: Left;  . Orif femur fracture Left 11/20/2015    Procedure: OPEN REDUCTION INTERNAL FIXATION (ORIF) DISTAL FEMUR FRACTURE;  Surgeon: Tarry Kos, MD;  Location: [facility] OR;  Service: Orthopedics;  Laterality: Left;  . Hardware removal Left 03/22/2016    Procedure: LEFT KNEE HARDWARE REMOVAL;  Surgeon: Tarry Kos, MD;  Location: [facility];  Service: Orthopedics;  Laterality: Left;    Current Outpatient Prescriptions  Medication Sig Dispense Refill  . adalimumab (HUMIRA) 40  MG/0.8ML injection Inject 40 mg into the skin every 14 (fourteen) days.    . Calcium Carbonate-Vit D-Min (CALCIUM 1200 PO) Take 1 tablet by mouth 2 (two) times daily.    . cetirizine (ZYRTEC) 10 MG tablet Take 10 mg by mouth daily.    . cyanocobalamin (,VITAMIN B-12,) 1000 MCG/ML injection Inject 1 mL (1,000 mcg total) into the muscle every 30 (thirty) days. 10 mL 1  . escitalopram (LEXAPRO) 20 MG tablet Take 1 tablet (20 mg total) by mouth daily. 30 tablet 2  . fluticasone (FLONASE) 50 MCG/ACT nasal spray Place 1-2 sprays into both nostrils daily.    Marland Kitchen HYDROcodone-acetaminophen (NORCO) 7.5-325 MG tablet Take 1-2 tablets by mouth every 6 (six) hours as needed for moderate pain. 90 tablet 0  . HYDROcodone-acetaminophen  (NORCO/VICODIN) 5-325 MG tablet Take 1 tablet by mouth every 6 (six) hours as needed for moderate pain.    Marland Kitchen MAGNESIUM PO Take 250 mg by mouth 2 (two) times daily.    . meloxicam (MOBIC) 15 MG tablet Take 7.5 mg by mouth 2 (two) times daily.     . methocarbamol (ROBAXIN) 750 MG tablet Take 1 tablet (750 mg total) by mouth 2 (two) times daily as needed for muscle spasms. 60 tablet 0  . mirtazapine (REMERON) 15 MG tablet Take 15 mg by mouth at bedtime.    . Multiple Vitamins-Minerals (MULTIVITAMIN WITH MINERALS) tablet Take 1 tablet by mouth daily.    Marland Kitchen omeprazole (PRILOSEC) 20 MG capsule Take 20 mg by mouth 2 (two) times daily before a meal.    . ondansetron (ZOFRAN) 4 MG tablet Take 1-2 tablets (4-8 mg total) by mouth every 8 (eight) hours as needed for nausea or vomiting. 40 tablet 0  . POTASSIUM PO Take 250 mg by mouth 2 (two) times daily.    . SUMAtriptan (IMITREX) 100 MG tablet May repeat in 2 hours if headache persists or recurs. (Patient taking differently: Take 100 mg by mouth every 2 (two) hours as needed for migraine. May repeat in 2 hours if headache persists or recurs.) 10 tablet 5  . vitamin C (ASCORBIC ACID) 500 MG tablet Take 500 mg by mouth 2 (two) times daily.     No current facility-administered medications for this visit.    Family History  Problem Relation Age of Onset  . Cervical cancer Mother   . Hypertension Father   . Heart failure Father   . Hypertension Brother     also with hip replacements  . Hyperlipidemia Brother   . Alcohol abuse Paternal Uncle   . Ulcerative colitis Mother     ROS:  Pertinent items are noted in HPI.  Otherwise, a comprehensive ROS was negative.  Exam:   BP 110/62 mmHg  Pulse 72  Resp 14  Ht 5' 3.75" (1.619 m)  Wt 193 lb 3.2 oz (87.635 kg)  BMI 33.43 kg/m2  LMP 04/19/2011 Height: 5' 3.75" (161.9 cm) Ht Readings from Last 3 Encounters:  05/24/16 5' 3.75" (1.619 m)  04/25/16 5' 4.25" (1.632 m)  04/07/16 5' 4.25" (1.632 m)     General appearance: alert, cooperative and appears stated age Head: Normocephalic, without obvious abnormality, atraumatic Neck: no adenopathy, supple, symmetrical, trachea midline and thyroid normal to inspection and palpation Lungs: clear to auscultation bilaterally Breasts: normal appearance, no masses or tenderness Heart: regular rate and rhythm Abdomen: soft, non-tender; no masses,  no organomegaly Extremities: extremities normal, atraumatic, no cyanosis or edema.  There is a healing scar left lateral  leg with a decrease ROM and use of a crutch. Skin: Skin color, texture, turgor normal. No rashes or lesions Lymph nodes: Cervical, supraclavicular, and axillary nodes normal. No abnormal inguinal nodes palpated Neurologic: Grossly normal   Pelvic: External genitalia:  no lesions              Urethra:  normal appearing urethra with no masses, tenderness or lesions              Bartholin's and Skene's: normal                 Vagina: normal appearing vagina with normal color and discharge, no lesions              Cervix: anteverted              Pap taken: No. 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  ROI for Mammo from [place]  Encouraged to follow with  PCP  Counseled on breast self exam, mammography screening, adequate intake of calcium and vitamin D, diet and exercise return annually or prn  An After Visit Summary was printed and given to the patient.

## 2016-05-25 LAB — VITAMIN D 25 HYDROXY (VIT D DEFICIENCY, FRACTURES): Vit D, 25-Hydroxy: 39 ng/mL (ref 30–100)

## 2016-05-25 LAB — TSH: TSH: 2.15 m[IU]/L

## 2016-05-31 ENCOUNTER — Telehealth: Payer: Self-pay | Admitting: *Deleted

## 2016-05-31 NOTE — Telephone Encounter (Signed)
Called patient per Mrs. Jasmine Richard. Advised her last MMG was 07/2014. She is due for MMG now needs to scheduled ASAP. Patient verbalized understanding and states she will schedule appt online.   Mrs Jasmine Richard Encounter closed.

## 2016-08-15 ENCOUNTER — Encounter

## 2016-08-24 ENCOUNTER — Ambulatory Visit (INDEPENDENT_AMBULATORY_CARE_PROVIDER_SITE_OTHER): Admitting: Psychiatry

## 2016-08-24 DIAGNOSIS — F1020 Alcohol dependence, uncomplicated: Secondary | ICD-10-CM

## 2016-08-24 DIAGNOSIS — F4323 Adjustment disorder with mixed anxiety and depressed mood: Secondary | ICD-10-CM | POA: Diagnosis not present

## 2016-08-30 ENCOUNTER — Ambulatory Visit (INDEPENDENT_AMBULATORY_CARE_PROVIDER_SITE_OTHER): Admitting: Psychiatry

## 2016-08-30 DIAGNOSIS — F1020 Alcohol dependence, uncomplicated: Secondary | ICD-10-CM

## 2016-08-30 DIAGNOSIS — F4323 Adjustment disorder with mixed anxiety and depressed mood: Secondary | ICD-10-CM | POA: Diagnosis not present

## 2016-08-31 ENCOUNTER — Encounter: Payer: Self-pay | Admitting: Obstetrics & Gynecology

## 2016-09-07 ENCOUNTER — Ambulatory Visit (INDEPENDENT_AMBULATORY_CARE_PROVIDER_SITE_OTHER): Admitting: Psychiatry

## 2016-09-07 DIAGNOSIS — F4323 Adjustment disorder with mixed anxiety and depressed mood: Secondary | ICD-10-CM | POA: Diagnosis not present

## 2016-09-14 ENCOUNTER — Ambulatory Visit (INDEPENDENT_AMBULATORY_CARE_PROVIDER_SITE_OTHER): Admitting: Psychiatry

## 2016-09-14 DIAGNOSIS — F1020 Alcohol dependence, uncomplicated: Secondary | ICD-10-CM

## 2016-09-14 DIAGNOSIS — F4323 Adjustment disorder with mixed anxiety and depressed mood: Secondary | ICD-10-CM | POA: Diagnosis not present

## 2016-09-21 ENCOUNTER — Ambulatory Visit: Admitting: Psychiatry

## 2016-09-28 ENCOUNTER — Ambulatory Visit (INDEPENDENT_AMBULATORY_CARE_PROVIDER_SITE_OTHER): Admitting: Psychiatry

## 2016-09-28 DIAGNOSIS — F4323 Adjustment disorder with mixed anxiety and depressed mood: Secondary | ICD-10-CM | POA: Diagnosis not present

## 2016-09-28 DIAGNOSIS — F1020 Alcohol dependence, uncomplicated: Secondary | ICD-10-CM | POA: Diagnosis not present

## 2016-10-03 ENCOUNTER — Ambulatory Visit (INDEPENDENT_AMBULATORY_CARE_PROVIDER_SITE_OTHER): Admitting: Psychiatry

## 2016-10-03 DIAGNOSIS — F4323 Adjustment disorder with mixed anxiety and depressed mood: Secondary | ICD-10-CM | POA: Diagnosis not present

## 2016-10-03 DIAGNOSIS — F1020 Alcohol dependence, uncomplicated: Secondary | ICD-10-CM | POA: Diagnosis not present

## 2016-10-16 ENCOUNTER — Ambulatory Visit (INDEPENDENT_AMBULATORY_CARE_PROVIDER_SITE_OTHER): Admitting: Gastroenterology

## 2016-10-16 DIAGNOSIS — Z23 Encounter for immunization: Secondary | ICD-10-CM | POA: Diagnosis not present

## 2016-10-16 DIAGNOSIS — R7989 Other specified abnormal findings of blood chemistry: Secondary | ICD-10-CM

## 2016-10-16 DIAGNOSIS — R945 Abnormal results of liver function studies: Secondary | ICD-10-CM

## 2016-10-16 DIAGNOSIS — Z9229 Personal history of other drug therapy: Secondary | ICD-10-CM

## 2016-10-17 ENCOUNTER — Ambulatory Visit: Admitting: Psychiatry

## 2016-10-31 ENCOUNTER — Ambulatory Visit (INDEPENDENT_AMBULATORY_CARE_PROVIDER_SITE_OTHER): Admitting: Psychiatry

## 2016-10-31 DIAGNOSIS — F4323 Adjustment disorder with mixed anxiety and depressed mood: Secondary | ICD-10-CM | POA: Diagnosis not present

## 2016-10-31 DIAGNOSIS — F1020 Alcohol dependence, uncomplicated: Secondary | ICD-10-CM | POA: Diagnosis not present

## 2016-11-09 ENCOUNTER — Ambulatory Visit (INDEPENDENT_AMBULATORY_CARE_PROVIDER_SITE_OTHER): Admitting: Psychiatry

## 2016-11-09 DIAGNOSIS — F4323 Adjustment disorder with mixed anxiety and depressed mood: Secondary | ICD-10-CM

## 2016-11-09 DIAGNOSIS — F1020 Alcohol dependence, uncomplicated: Secondary | ICD-10-CM

## 2016-11-16 ENCOUNTER — Ambulatory Visit: Admitting: Psychiatry

## 2016-11-23 ENCOUNTER — Ambulatory Visit (INDEPENDENT_AMBULATORY_CARE_PROVIDER_SITE_OTHER): Admitting: Psychiatry

## 2016-11-23 DIAGNOSIS — F1020 Alcohol dependence, uncomplicated: Secondary | ICD-10-CM | POA: Diagnosis not present

## 2016-11-23 DIAGNOSIS — F4323 Adjustment disorder with mixed anxiety and depressed mood: Secondary | ICD-10-CM

## 2016-11-30 ENCOUNTER — Ambulatory Visit: Admitting: Psychiatry

## 2016-12-07 ENCOUNTER — Ambulatory Visit (INDEPENDENT_AMBULATORY_CARE_PROVIDER_SITE_OTHER): Admitting: Psychiatry

## 2016-12-07 DIAGNOSIS — F4323 Adjustment disorder with mixed anxiety and depressed mood: Secondary | ICD-10-CM

## 2016-12-07 DIAGNOSIS — F1020 Alcohol dependence, uncomplicated: Secondary | ICD-10-CM

## 2016-12-21 ENCOUNTER — Ambulatory Visit (INDEPENDENT_AMBULATORY_CARE_PROVIDER_SITE_OTHER): Admitting: Psychiatry

## 2016-12-21 DIAGNOSIS — F1020 Alcohol dependence, uncomplicated: Secondary | ICD-10-CM

## 2016-12-21 DIAGNOSIS — F4323 Adjustment disorder with mixed anxiety and depressed mood: Secondary | ICD-10-CM | POA: Diagnosis not present

## 2016-12-28 ENCOUNTER — Ambulatory Visit (INDEPENDENT_AMBULATORY_CARE_PROVIDER_SITE_OTHER): Admitting: Psychiatry

## 2016-12-28 DIAGNOSIS — F4323 Adjustment disorder with mixed anxiety and depressed mood: Secondary | ICD-10-CM | POA: Diagnosis not present

## 2016-12-28 DIAGNOSIS — F1020 Alcohol dependence, uncomplicated: Secondary | ICD-10-CM | POA: Diagnosis not present

## 2017-01-04 ENCOUNTER — Ambulatory Visit: Admitting: Psychiatry

## 2017-01-11 ENCOUNTER — Ambulatory Visit (INDEPENDENT_AMBULATORY_CARE_PROVIDER_SITE_OTHER): Admitting: Psychiatry

## 2017-01-11 ENCOUNTER — Encounter: Payer: Self-pay | Admitting: Obstetrics & Gynecology

## 2017-01-11 DIAGNOSIS — F4323 Adjustment disorder with mixed anxiety and depressed mood: Secondary | ICD-10-CM | POA: Diagnosis not present

## 2017-01-11 DIAGNOSIS — F1020 Alcohol dependence, uncomplicated: Secondary | ICD-10-CM

## 2017-01-18 ENCOUNTER — Ambulatory Visit (INDEPENDENT_AMBULATORY_CARE_PROVIDER_SITE_OTHER): Admitting: Psychiatry

## 2017-01-18 DIAGNOSIS — F4323 Adjustment disorder with mixed anxiety and depressed mood: Secondary | ICD-10-CM | POA: Diagnosis not present

## 2017-01-18 DIAGNOSIS — F1020 Alcohol dependence, uncomplicated: Secondary | ICD-10-CM

## 2017-01-25 ENCOUNTER — Ambulatory Visit: Payer: Self-pay | Admitting: Psychiatry

## 2017-01-30 ENCOUNTER — Other Ambulatory Visit: Payer: Self-pay | Admitting: Geriatric Medicine

## 2017-01-30 DIAGNOSIS — R600 Localized edema: Secondary | ICD-10-CM

## 2017-02-01 ENCOUNTER — Ambulatory Visit (INDEPENDENT_AMBULATORY_CARE_PROVIDER_SITE_OTHER): Admitting: Psychiatry

## 2017-02-01 DIAGNOSIS — F1020 Alcohol dependence, uncomplicated: Secondary | ICD-10-CM

## 2017-02-01 DIAGNOSIS — F4323 Adjustment disorder with mixed anxiety and depressed mood: Secondary | ICD-10-CM

## 2017-02-08 ENCOUNTER — Ambulatory Visit: Admitting: Psychiatry

## 2017-02-15 ENCOUNTER — Other Ambulatory Visit: Payer: Self-pay

## 2017-02-15 ENCOUNTER — Ambulatory Visit (HOSPITAL_COMMUNITY): Attending: Cardiovascular Disease

## 2017-02-15 DIAGNOSIS — I351 Nonrheumatic aortic (valve) insufficiency: Secondary | ICD-10-CM | POA: Diagnosis not present

## 2017-02-15 DIAGNOSIS — R600 Localized edema: Secondary | ICD-10-CM | POA: Diagnosis not present

## 2017-02-22 ENCOUNTER — Ambulatory Visit (INDEPENDENT_AMBULATORY_CARE_PROVIDER_SITE_OTHER): Admitting: Psychiatry

## 2017-02-22 DIAGNOSIS — F1020 Alcohol dependence, uncomplicated: Secondary | ICD-10-CM

## 2017-02-22 DIAGNOSIS — F4323 Adjustment disorder with mixed anxiety and depressed mood: Secondary | ICD-10-CM | POA: Diagnosis not present

## 2017-03-01 ENCOUNTER — Ambulatory Visit (INDEPENDENT_AMBULATORY_CARE_PROVIDER_SITE_OTHER): Admitting: Psychiatry

## 2017-03-01 DIAGNOSIS — F4323 Adjustment disorder with mixed anxiety and depressed mood: Secondary | ICD-10-CM

## 2017-03-01 DIAGNOSIS — F1020 Alcohol dependence, uncomplicated: Secondary | ICD-10-CM | POA: Diagnosis not present

## 2017-03-08 ENCOUNTER — Ambulatory Visit (INDEPENDENT_AMBULATORY_CARE_PROVIDER_SITE_OTHER): Admitting: Psychiatry

## 2017-03-08 DIAGNOSIS — F1020 Alcohol dependence, uncomplicated: Secondary | ICD-10-CM

## 2017-03-08 DIAGNOSIS — F4323 Adjustment disorder with mixed anxiety and depressed mood: Secondary | ICD-10-CM

## 2017-03-29 ENCOUNTER — Ambulatory Visit: Payer: Self-pay | Admitting: Psychiatry

## 2017-04-05 ENCOUNTER — Ambulatory Visit (INDEPENDENT_AMBULATORY_CARE_PROVIDER_SITE_OTHER): Admitting: Psychiatry

## 2017-04-05 DIAGNOSIS — F1020 Alcohol dependence, uncomplicated: Secondary | ICD-10-CM | POA: Diagnosis not present

## 2017-04-05 DIAGNOSIS — F4323 Adjustment disorder with mixed anxiety and depressed mood: Secondary | ICD-10-CM

## 2017-04-19 ENCOUNTER — Ambulatory Visit: Admitting: Psychiatry

## 2017-04-26 ENCOUNTER — Ambulatory Visit (INDEPENDENT_AMBULATORY_CARE_PROVIDER_SITE_OTHER): Admitting: Psychiatry

## 2017-04-26 DIAGNOSIS — F4323 Adjustment disorder with mixed anxiety and depressed mood: Secondary | ICD-10-CM

## 2017-04-26 DIAGNOSIS — F1020 Alcohol dependence, uncomplicated: Secondary | ICD-10-CM

## 2017-05-03 ENCOUNTER — Ambulatory Visit: Payer: Self-pay | Admitting: Psychiatry

## 2017-05-10 ENCOUNTER — Ambulatory Visit (INDEPENDENT_AMBULATORY_CARE_PROVIDER_SITE_OTHER): Admitting: Psychiatry

## 2017-05-10 DIAGNOSIS — F1020 Alcohol dependence, uncomplicated: Secondary | ICD-10-CM | POA: Diagnosis not present

## 2017-05-10 DIAGNOSIS — F4323 Adjustment disorder with mixed anxiety and depressed mood: Secondary | ICD-10-CM | POA: Diagnosis not present

## 2017-05-17 ENCOUNTER — Ambulatory Visit (INDEPENDENT_AMBULATORY_CARE_PROVIDER_SITE_OTHER): Admitting: Psychiatry

## 2017-05-17 DIAGNOSIS — F1020 Alcohol dependence, uncomplicated: Secondary | ICD-10-CM

## 2017-05-17 DIAGNOSIS — F4323 Adjustment disorder with mixed anxiety and depressed mood: Secondary | ICD-10-CM

## 2017-05-24 ENCOUNTER — Ambulatory Visit: Admitting: Psychiatry

## 2017-05-24 DIAGNOSIS — F4323 Adjustment disorder with mixed anxiety and depressed mood: Secondary | ICD-10-CM | POA: Insufficient documentation

## 2017-05-25 ENCOUNTER — Ambulatory Visit: Admitting: Nurse Practitioner

## 2017-05-25 ENCOUNTER — Encounter: Payer: Self-pay | Admitting: Nurse Practitioner

## 2017-05-25 NOTE — Progress Notes (Deleted)
Patient ID: Jasmine Richard, female   DOB: 1956/05/08, 61 y.o.   MRN: [id number]  61 y.o. [id number] Widowed  Caucasian Fe here for annual exam.  Last year she had a hard time dealing with death of husband and started drinking again.  Patient's last menstrual period was 04/19/2011.          Sexually active: {yes no:314532}  The current method of family planning is {contraception:315051}.    Exercising: {yes no:314532}  {types:19826} Smoker:  {YES NO:22349}  Health Maintenance: 61 y.o. G3P3003 Widowed  {Race/ethnicity:17218} Fe here for annual exam.    Patient's last menstrual period was 04/19/2011.          Sexually active: {yes no:314532}  The current method of family planning is {contraception:315051}.    Exercising: {yes no:314532}  {types:19826} Smoker:  {YES NO:22349}  Health Maintenance: Pap: 11/03/14, Negative with neg HR HPV  10/16/13, Negative with neg HR HPV History of Abnormal Pap: {YES NO:22349} MMG: 08/18/16, 3D-yes, Density Category B, Bi-Rads 1:  Negative Self Breast exams: {YES NO:22349} Colonoscopy: 04/25/16, normal, repeat in 10 years BMD: 11/12/14 T Score: *** Spine / *** Right Femur Neck / *** Left Femur Neck TDaP: 10/28/15 Shingles: *** Pneumonia: Not indicated due to age Hep C: 04/25/16 HIV: 09/19/15 Labs: PCP takes care of screening labs   reports that she has never smoked. She has never used smokeless tobacco. She reports that she does not drink alcohol or use drugs.  Past Medical History:  Diagnosis Date  . ADD (attention deficit disorder)   . Anxiety   . Depression   . Diverticulosis   . GERD (gastroesophageal reflux disease)   . H/O ETOH abuse    recovered  . Headache   . Heart murmur   . IDA (iron deficiency anemia)   . Insomnia   . Osteoarthritis   . Psoriatic arthritis (HCC)    Dr. Zenovia Jordan    Past Surgical History:  Procedure Laterality Date  . CARPAL TUNNEL RELEASE  09/2014  . CESAREAN SECTION     x2  . CHOLECYSTECTOMY  1984   . COLONOSCOPY  2005   recheck in 5 years  . FEMUR CLOSED REDUCTION Left 11/19/2015   Procedure: CLOSED REDUCTION LEFT DISTAL FEMUR;  Surgeon: Beverely Low, MD;  Location: [facility];  Service: Orthopedics;  Laterality: Left;  Marland Kitchen GASTRIC BYPASS  2002   lost 140 lb  . HARDWARE REMOVAL Left 03/22/2016   Procedure: LEFT KNEE HARDWARE REMOVAL;  Surgeon: Tarry Kos, MD;  Location: [facility];  Service: Orthopedics;  Laterality: Left;  . HYSTEROSCOPY W/D&C  07   no polyps  . ORIF FEMUR FRACTURE Left 11/20/2015   Procedure: OPEN REDUCTION INTERNAL FIXATION (ORIF) DISTAL FEMUR FRACTURE;  Surgeon: Tarry Kos, MD;  Location: [facility];  Service: Orthopedics;  Laterality: Left;  . TARSAL METATARSAL ARTHRODESIS  11/28/2012   Procedure: TARSAL METATARSAL FUSION;  Surgeon: Sherri Rad, MD;  Location: [facility];  Service: Orthopedics;  Laterality: Right;  right 2nd and 3rd TMT joint fusion local bone graft stress x ray foot   . TEE WITHOUT CARDIOVERSION N/A 01/07/2015   Procedure: TRANSESOPHAGEAL ECHOCARDIOGRAM (TEE);  Surgeon: Lars Masson, MD;  Location: [facility];  Service: Cardiovascular;  Laterality: N/A;  . TONSILLECTOMY AND ADENOIDECTOMY  1960  . ULNAR TUNNEL RELEASE  09/2014    Current Outpatient Prescriptions  Medication Sig Dispense Refill  . adalimumab (HUMIRA) 40 MG/0.8ML injection Inject 40 mg into the  skin every 14 (fourteen) days.    . Calcium Carbonate-Vit D-Min (CALCIUM 1200 PO) Take 1 tablet by mouth 2 (two) times daily.    . cetirizine (ZYRTEC) 10 MG tablet Take 10 mg by mouth daily.    . cyanocobalamin (,VITAMIN B-12,) 1000 MCG/ML injection Inject 1 mL (1,000 mcg total) into the muscle every 30 (thirty) days. 10 mL 1  . escitalopram (LEXAPRO) 20 MG tablet Take 1 tablet (20 mg total) by mouth daily. 30 tablet 2  . fluticasone (FLONASE) 50 MCG/ACT nasal spray Place 1-2 sprays into both nostrils daily.    Marland Kitchen HYDROcodone-acetaminophen (NORCO) 7.5-325 MG  tablet Take 1-2 tablets by mouth every 6 (six) hours as needed for moderate pain. 90 tablet 0  . HYDROcodone-acetaminophen (NORCO/VICODIN) 5-325 MG tablet Take 1 tablet by mouth every 6 (six) hours as needed for moderate pain.    Marland Kitchen MAGNESIUM PO Take 250 mg by mouth 2 (two) times daily.    . meloxicam (MOBIC) 15 MG tablet Take 7.5 mg by mouth 2 (two) times daily.     . methocarbamol (ROBAXIN) 750 MG tablet Take 1 tablet (750 mg total) by mouth 2 (two) times daily as needed for muscle spasms. 60 tablet 0  . mirtazapine (REMERON) 15 MG tablet Take 15 mg by mouth at bedtime.    . Multiple Vitamins-Minerals (MULTIVITAMIN WITH MINERALS) tablet Take 1 tablet by mouth daily.    Marland Kitchen omeprazole (PRILOSEC) 20 MG capsule Take 20 mg by mouth 2 (two) times daily before a meal.    . ondansetron (ZOFRAN) 4 MG tablet Take 1-2 tablets (4-8 mg total) by mouth every 8 (eight) hours as needed for nausea or vomiting. 40 tablet 0  . POTASSIUM PO Take 250 mg by mouth 2 (two) times daily.    . SUMAtriptan (IMITREX) 100 MG tablet May repeat in 2 hours if headache persists or recurs. (Patient taking differently: Take 100 mg by mouth every 2 (two) hours as needed for migraine. May repeat in 2 hours if headache persists or recurs.) 10 tablet 5  . vitamin C (ASCORBIC ACID) 500 MG tablet Take 500 mg by mouth 2 (two) times daily.     No current facility-administered medications for this visit.     Family History  Problem Relation Age of Onset  . Cervical cancer Mother   . Ulcerative colitis Mother   . Hypertension Father   . Heart failure Father   . Hypertension Brother        also with hip replacements  . Hyperlipidemia Brother   . Alcohol abuse Paternal Uncle     ROS:  Pertinent items are noted in HPI.  Otherwise, a comprehensive ROS was negative.  Exam:   LMP 04/19/2011    Ht Readings from Last 3 Encounters:  05/24/16 5' 3.75" (1.619 m)  04/25/16 5' 4.25" (1.632 m)  04/07/16 5' 4.25" (1.632 m)    General  appearance: alert, cooperative and appears stated age Head: Normocephalic, without obvious abnormality, atraumatic Neck: no adenopathy, supple, symmetrical, trachea midline and thyroid {EXAM; THYROID:18604} Lungs: clear to auscultation bilaterally Breasts: {Exam; breast:13139::"normal appearance, no masses or tenderness"} Heart: regular rate and rhythm Abdomen: soft, non-tender; no masses,  no organomegaly Extremities: extremities normal, atraumatic, no cyanosis or edema Skin: Skin color, texture, turgor normal. No rashes or lesions Lymph nodes: Cervical, supraclavicular, and axillary nodes normal. No abnormal inguinal nodes palpated Neurologic: Grossly normal   Pelvic: External genitalia:  no lesions  Urethra:  normal appearing urethra with no masses, tenderness or lesions              Bartholin's and Skene's: normal                 Vagina: normal appearing vagina with normal color and discharge, no lesions              Cervix: {exam; cervix:14595}              Pap taken: {yes no:314532} Bimanual Exam:  Uterus:  {exam; uterus:12215}              Adnexa: {exam; adnexa:12223}               Rectovaginal: Confirms               Anus:  normal sphincter tone, no lesions  Chaperone present: ***  A:  Well Woman with normal exam  Postmenopausal - no HRT  S/P D&C and H' resection of 2007  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    reports that she has never smoked. She has never used smokeless tobacco. She reports that she does not drink alcohol or use drugs.  Past Medical History:  Diagnosis Date  . ADD (attention deficit disorder)   . Anxiety   . Depression   . Diverticulosis   . GERD (gastroesophageal  reflux disease)   . H/O ETOH abuse    recovered  . Headache   . Heart murmur   . IDA (iron deficiency anemia)   . Insomnia   . Osteoarthritis   . Psoriatic arthritis (HCC)    Dr. Zenovia Jordan    Past Surgical History:  Procedure Laterality Date  . CARPAL TUNNEL RELEASE  09/2014  . CESAREAN SECTION     x2  . CHOLECYSTECTOMY  1984  . COLONOSCOPY  2005   recheck in 5 years  . FEMUR CLOSED REDUCTION Left 11/19/2015   Procedure: CLOSED REDUCTION LEFT DISTAL FEMUR;  Surgeon: Beverely Low, MD;  Location: [facility];  Service: Orthopedics;  Laterality: Left;  Marland Kitchen GASTRIC BYPASS  2002   lost 140 lb  . HARDWARE REMOVAL Left 03/22/2016   Procedure: LEFT KNEE HARDWARE REMOVAL;  Surgeon: Tarry Kos, MD;  Location: [facility];  Service: Orthopedics;  Laterality: Left;  . HYSTEROSCOPY W/D&C  07   no polyps  . ORIF FEMUR FRACTURE Left 11/20/2015   Procedure: OPEN REDUCTION INTERNAL FIXATION (ORIF) DISTAL FEMUR FRACTURE;  Surgeon: Tarry Kos, MD;  Location: [facility];  Service: Orthopedics;  Laterality: Left;  . TARSAL METATARSAL ARTHRODESIS  11/28/2012   Procedure: TARSAL METATARSAL FUSION;  Surgeon: Sherri Rad, MD;  Location: [facility];  Service: Orthopedics;  Laterality: Right;  right 2nd and 3rd TMT joint fusion local bone graft stress x ray foot   . TEE WITHOUT CARDIOVERSION N/A 01/07/2015   Procedure: TRANSESOPHAGEAL ECHOCARDIOGRAM (TEE);  Surgeon: Lars Masson, MD;  Location: [facility]COPY;  Service: Cardiovascular;  Laterality: N/A;  . TONSILLECTOMY AND ADENOIDECTOMY  1960  . ULNAR TUNNEL RELEASE  09/2014    Current Outpatient Prescriptions  Medication Sig Dispense Refill  . adalimumab (HUMIRA) 40 MG/0.8ML injection Inject 40 mg into the  skin every 14 (fourteen) days.    . Calcium Carbonate-Vit D-Min (CALCIUM 1200 PO) Take 1 tablet by mouth 2 (two) times daily.    . cetirizine (ZYRTEC) 10 MG tablet Take 10 mg by mouth daily.    . cyanocobalamin  (,VITAMIN B-12,) 1000 MCG/ML injection Inject 1 mL (1,000 mcg total) into the muscle every 30 (thirty) days. 10 mL 1  . escitalopram (LEXAPRO) 20 MG tablet Take 1 tablet (20 mg total) by mouth daily. 30 tablet 2  . fluticasone (FLONASE) 50 MCG/ACT nasal spray Place 1-2 sprays into both nostrils daily.    Marland Kitchen HYDROcodone-acetaminophen (NORCO) 7.5-325 MG tablet Take 1-2 tablets by mouth every 6 (six) hours as needed for moderate pain. 90 tablet 0  . HYDROcodone-acetaminophen (NORCO/VICODIN) 5-325 MG tablet Take 1 tablet by mouth every 6 (six) hours as needed for moderate pain.    Marland Kitchen MAGNESIUM PO Take 250 mg by mouth 2 (two) times daily.    . meloxicam (MOBIC) 15 MG tablet Take 7.5 mg by mouth 2 (two) times daily.     . methocarbamol (ROBAXIN) 750 MG tablet Take 1 tablet (750 mg total) by mouth 2 (two) times daily as needed for muscle spasms. 60 tablet 0  . mirtazapine (REMERON) 15 MG tablet Take 15 mg by mouth at bedtime.    . Multiple Vitamins-Minerals (MULTIVITAMIN WITH MINERALS) tablet Take 1 tablet by mouth daily.    Marland Kitchen omeprazole (PRILOSEC) 20 MG capsule Take 20 mg by mouth 2 (two) times daily before a meal.    . ondansetron (ZOFRAN) 4 MG tablet Take 1-2 tablets (4-8 mg total) by mouth every 8 (eight) hours as needed for nausea or vomiting. 40 tablet 0  . POTASSIUM PO Take 250 mg by mouth 2 (two) times daily.    . SUMAtriptan (IMITREX) 100 MG tablet May repeat in 2 hours if headache persists or recurs. (Patient taking differently: Take 100 mg by mouth every 2 (two) hours as needed for migraine. May repeat in 2 hours if headache persists or recurs.) 10 tablet 5  . vitamin C (ASCORBIC ACID) 500 MG tablet Take 500 mg by mouth 2 (two) times daily.     No current facility-administered medications for this visit.     Family History  Problem Relation Age of Onset  . Cervical cancer Mother   . Ulcerative colitis Mother   . Hypertension Father   . Heart failure Father   . Hypertension Brother         also with hip replacements  . Hyperlipidemia Brother   . Alcohol abuse Paternal Uncle     ROS:  Pertinent items are noted in HPI.  Otherwise, a comprehensive ROS was negative.  Exam:   LMP 04/19/2011    Ht Readings from Last 3 Encounters:  05/24/16 5' 3.75" (1.619 m)  04/25/16 5' 4.25" (1.632 m)  04/07/16 5' 4.25" (1.632 m)    General appearance: alert, cooperative and appears stated age Head: Normocephalic, without obvious abnormality, atraumatic Neck: no adenopathy, supple, symmetrical, trachea midline and thyroid {EXAM; THYROID:18604} Lungs: clear to auscultation bilaterally Breasts: {Exam; breast:13139::"normal appearance, no masses or tenderness"} Heart: regular rate and rhythm Abdomen: soft, non-tender; no masses,  no organomegaly Extremities: extremities normal, atraumatic, no cyanosis or edema Skin: Skin color, texture, turgor normal. No rashes or lesions Lymph nodes: Cervical, supraclavicular, and axillary nodes normal. No abnormal inguinal nodes palpated Neurologic: Grossly normal   Pelvic: External genitalia:  no lesions  Urethra:  normal appearing urethra with no masses, tenderness or lesions              Bartholin's and Skene's: normal                 Vagina: normal appearing vagina with normal color and discharge, no lesions              Cervix: {exam; cervix:14595}              Pap taken: {yes no:314532} Bimanual Exam:  Uterus:  {exam; uterus:12215}              Adnexa: {exam; adnexa:12223}               Rectovaginal: Confirms               Anus:  normal sphincter tone, no lesions  Chaperone present: ***  A:  Well Woman with normal exam  P:   Reviewed health and wellness pertinent to exam  Pap smear: {YES NO:22349}  {plan; gyn:5269::"mammogram","pap smear","return annually or prn"}  An After Visit Summary was printed and given to the patient.

## 2017-05-31 ENCOUNTER — Ambulatory Visit: Admitting: Psychiatry

## 2017-06-07 ENCOUNTER — Ambulatory Visit: Payer: Self-pay | Admitting: Psychiatry

## 2017-06-28 ENCOUNTER — Ambulatory Visit: Payer: Self-pay | Admitting: Psychiatry

## 2017-07-05 ENCOUNTER — Ambulatory Visit: Admitting: Psychiatry

## 2017-07-12 ENCOUNTER — Ambulatory Visit: Admitting: Psychiatry

## 2017-07-19 ENCOUNTER — Ambulatory Visit (INDEPENDENT_AMBULATORY_CARE_PROVIDER_SITE_OTHER): Admitting: Psychiatry

## 2017-07-19 DIAGNOSIS — F4323 Adjustment disorder with mixed anxiety and depressed mood: Secondary | ICD-10-CM | POA: Diagnosis not present

## 2017-07-19 DIAGNOSIS — F1020 Alcohol dependence, uncomplicated: Secondary | ICD-10-CM | POA: Diagnosis not present

## 2017-07-24 ENCOUNTER — Ambulatory Visit (HOSPITAL_COMMUNITY): Admitting: Psychology

## 2017-07-26 ENCOUNTER — Ambulatory Visit: Admitting: Psychiatry

## 2017-07-26 NOTE — Progress Notes (Addendum)
02-15-17 (EPIC) ECHO 55-60% EF, no stenosis, mild to mod regurgitation  08-01-17 CBC result, routed to Dr. Charlann Boxer for review.

## 2017-07-26 NOTE — Patient Instructions (Addendum)
Jasmine Richard  07/26/2017   Your procedure is scheduled on: 08-20-17  Report to [facility] Main  Entrance Report to admitting at 7:00 AM   Call this number if you have problems the morning of surgery  [phone]   Remember: ONLY 1 PERSON MAY GO WITH YOU TO SHORT STAY TO GET  READY MORNING OF YOUR SURGERY.  Do not eat food or drink liquids :After Midnight.     Take these medicines the morning of surgery with A SIP OF WATER: Lexapro (Escitalopram), Omeprazole (Prilosec)                                You may not have any metal on your body including hair pins and              piercings  Do not wear jewelry, make-up, lotions, powders or perfumes, deodorant             Do not wear nail polish.  Do not shave  48 hours prior to surgery.     Do not bring valuables to the hospital. [facility] IS NOT             RESPONSIBLE   FOR VALUABLES.  Contacts, dentures or bridgework may not be worn into surgery.  Leave suitcase in the car. After surgery it may be brought to your room.                 Please read over the following fact sheets you were given: _____________________________________________________________________             [facility] - Preparing for Surgery Before surgery, you can play an important role.  Because skin is not sterile, your skin needs to be as free of germs as possible.  You can reduce the number of germs on your skin by washing with CHG (chlorahexidine gluconate) soap before surgery.  CHG is an antiseptic cleaner which kills germs and bonds with the skin to continue killing germs even after washing. Please DO NOT use if you have an allergy to CHG or antibacterial soaps.  If your skin becomes reddened/irritated stop using the CHG and inform your nurse when you arrive at Short Stay. Do not shave (including legs and underarms) for at least 48 hours prior to the first CHG shower.  You may shave your face/neck. Please follow these  instructions carefully:  1.  Shower with CHG Soap the night before surgery and the  morning of Surgery.  2.  If you choose to wash your hair, wash your hair first as usual with your  normal  shampoo.  3.  After you shampoo, rinse your hair and body thoroughly to remove the  shampoo.                           4.  Use CHG as you would any other liquid soap.  You can apply chg directly  to the skin and wash                       Gently with a scrungie or clean washcloth.  5.  Apply the CHG Soap to your body ONLY FROM THE NECK DOWN.   Do not use on face/ open  Wound or open sores. Avoid contact with eyes, ears mouth and genitals (private parts).                       Wash face,  Genitals (private parts) with your normal soap.             6.  Wash thoroughly, paying special attention to the area where your surgery  will be performed.  7.  Thoroughly rinse your body with warm water from the neck down.  8.  DO NOT shower/wash with your normal soap after using and rinsing off  the CHG Soap.                9.  Pat yourself dry with a clean towel.            10.  Wear clean pajamas.            11.  Place clean sheets on your bed the night of your first shower and do not  sleep with pets. Day of Surgery : Do not apply any lotions/deodorants the morning of surgery.  Please wear clean clothes to the hospital/surgery center.  FAILURE TO FOLLOW THESE INSTRUCTIONS MAY RESULT IN THE CANCELLATION OF YOUR SURGERY PATIENT SIGNATURE_________________________________  NURSE SIGNATURE__________________________________  ________________________________________________________________________   Jasmine Richard  An incentive spirometer is a tool that can help keep your lungs clear and active. This tool measures how well you are filling your lungs with each breath. Taking long deep breaths may help reverse or decrease the chance of developing breathing (pulmonary) problems (especially  infection) following:  A long period of time when you are unable to move or be active. BEFORE THE PROCEDURE   If the spirometer includes an indicator to show your best effort, your nurse or respiratory therapist will set it to a desired goal.  If possible, sit up straight or lean slightly forward. Try not to slouch.  Hold the incentive spirometer in an upright position. INSTRUCTIONS FOR USE  1. Sit on the edge of your bed if possible, or sit up as far as you can in bed or on a chair. 2. Hold the incentive spirometer in an upright position. 3. Breathe out normally. 4. Place the mouthpiece in your mouth and seal your lips tightly around it. 5. Breathe in slowly and as deeply as possible, raising the piston or the ball toward the top of the column. 6. Hold your breath for 3-5 seconds or for as long as possible. Allow the piston or ball to fall to the bottom of the column. 7. Remove the mouthpiece from your mouth and breathe out normally. 8. Rest for a few seconds and repeat Steps 1 through 7 at least 10 times every 1-2 hours when you are awake. Take your time and take a few normal breaths between deep breaths. 9. The spirometer may include an indicator to show your best effort. Use the indicator as a goal to work toward during each repetition. 10. After each set of 10 deep breaths, practice coughing to be sure your lungs are clear. If you have an incision (the cut made at the time of surgery), support your incision when coughing by placing a pillow or rolled up towels firmly against it. Once you are able to get out of bed, walk around indoors and cough well. You may stop using the incentive spirometer when instructed by your caregiver.  RISKS AND COMPLICATIONS  Take your time so you do not get  dizzy or light-headed.  If you are in pain, you may need to take or ask for pain medication before doing incentive spirometry. It is harder to take a deep breath if you are having pain. AFTER  USE  Rest and breathe slowly and easily.  It can be helpful to keep track of a log of your progress. Your caregiver can provide you with a simple table to help with this. If you are using the spirometer at home, follow these instructions: [place] IF:   You are having difficultly using the spirometer.  You have trouble using the spirometer as often as instructed.  Your pain medication is not giving enough relief while using the spirometer.  You develop fever of 100.5 F (38.1 C) or higher. SEEK IMMEDIATE MEDICAL CARE IF:   You cough up bloody sputum that had not been present before.  You develop fever of 102 F (38.9 C) or greater.  You develop worsening pain at or near the incision site. MAKE SURE YOU:   Understand these instructions.  Will watch your condition.  Will get help right away if you are not doing well or get worse. Document Released: 03/26/2007 Document Revised: 02/05/2012 Document Reviewed: 05/27/2007 Exit[employer] Patient Information 2014 Exit[employer], [place].   ________________________________________________________________________  WHAT IS A BLOOD TRANSFUSION? Blood Transfusion Information  A transfusion is the replacement of blood or some of its parts. Blood is made up of multiple cells which provide different functions.  Red blood cells carry oxygen and are used for blood loss replacement.  White blood cells fight against infection.  Platelets control bleeding.  Plasma helps clot blood.  Other blood products are available for specialized needs, such as hemophilia or other clotting disorders. BEFORE THE TRANSFUSION  Who gives blood for transfusions?   Healthy volunteers who are fully evaluated to make sure their blood is safe. This is blood bank blood. Transfusion therapy is the safest it has ever been in the practice of medicine. Before blood is taken from a donor, a complete history is taken to make sure that person has no history of diseases  nor engages in risky social behavior (examples are intravenous drug use or sexual activity with multiple partners). The donor's travel history is screened to minimize risk of transmitting infections, such as malaria. The donated blood is tested for signs of infectious diseases, such as HIV and hepatitis. The blood is then tested to be sure it is compatible with you in order to minimize the chance of a transfusion reaction. If you or a relative donates blood, this is often done in anticipation of surgery and is not appropriate for emergency situations. It takes many days to process the donated blood. RISKS AND COMPLICATIONS Although transfusion therapy is very safe and saves many lives, the main dangers of transfusion include:   Getting an infectious disease.  Developing a transfusion reaction. This is an allergic reaction to something in the blood you were given. Every precaution is taken to prevent this. The decision to have a blood transfusion has been considered carefully by your caregiver before blood is given. Blood is not given unless the benefits outweigh the risks. AFTER THE TRANSFUSION  Right after receiving a blood transfusion, you will usually feel much better and more energetic. This is especially true if your red blood cells have gotten low (anemic). The transfusion raises the level of the red blood cells which carry oxygen, and this usually causes an energy increase.  The nurse administering the transfusion will  monitor you carefully for complications. HOME CARE INSTRUCTIONS  No special instructions are needed after a transfusion. You may find your energy is better. Speak with your caregiver about any limitations on activity for underlying diseases you may have. SEEK MEDICAL CARE IF:   Your condition is not improving after your transfusion.  You develop redness or irritation at the intravenous (IV) site. SEEK IMMEDIATE MEDICAL CARE IF:  Any of the following symptoms occur over the  next 12 hours:  Shaking chills.  You have a temperature by mouth above 102 F (38.9 C), not controlled by medicine.  Chest, back, or muscle pain.  People around you feel you are not acting correctly or are confused.  Shortness of breath or difficulty breathing.  Dizziness and fainting.  You get a rash or develop hives.  You have a decrease in urine output.  Your urine turns a dark color or changes to pink, red, or brown. Any of the following symptoms occur over the next 10 days:  You have a temperature by mouth above 102 F (38.9 C), not controlled by medicine.  Shortness of breath.  Weakness after normal activity.  The white part of the eye turns yellow (jaundice).  You have a decrease in the amount of urine or are urinating less often.  Your urine turns a dark color or changes to pink, red, or brown. Document Released: 11/10/2000 Document Revised: 02/05/2012 Document Reviewed: 06/29/2008 [facility] Patient Information 2014 [place], [place].  _______________________________________________________________________

## 2017-07-31 ENCOUNTER — Encounter (HOSPITAL_COMMUNITY)
Admission: RE | Admit: 2017-07-31 | Discharge: 2017-07-31 | Disposition: A | Discharge status: Home / Self Care | Source: Ambulatory Visit | Attending: Orthopedic Surgery | Admitting: Orthopedic Surgery

## 2017-07-31 ENCOUNTER — Encounter (HOSPITAL_COMMUNITY): Payer: Self-pay

## 2017-07-31 DIAGNOSIS — I10 Essential (primary) hypertension: Secondary | ICD-10-CM | POA: Diagnosis not present

## 2017-07-31 DIAGNOSIS — Z01818 Encounter for other preprocedural examination: Secondary | ICD-10-CM | POA: Insufficient documentation

## 2017-07-31 LAB — CBC
HCT: 37.2 % (ref 36.0–46.0)
HEMOGLOBIN: 13.2 g/dL (ref 12.0–15.0)
MCH: 34.6 pg — AB (ref 26.0–34.0)
MCHC: 35.5 g/dL (ref 30.0–36.0)
MCV: 97.6 fL (ref 78.0–100.0)
Platelets: 190 10*3/uL (ref 150–400)
RBC: 3.81 MIL/uL — AB (ref 3.87–5.11)
RDW: 14.5 % (ref 11.5–15.5)
WBC: 2.5 10*3/uL — ABNORMAL LOW (ref 4.0–10.5)

## 2017-07-31 LAB — BASIC METABOLIC PANEL
ANION GAP: 6 (ref 5–15)
BUN: 8 mg/dL (ref 6–20)
CHLORIDE: 105 mmol/L (ref 101–111)
CO2: 26 mmol/L (ref 22–32)
Calcium: 9.2 mg/dL (ref 8.9–10.3)
Creatinine, Ser: 0.52 mg/dL (ref 0.44–1.00)
GFR calc non Af Amer: >60 mL/min (ref >60)
GLUCOSE: 81 mg/dL (ref 65–99)
POTASSIUM: 4.4 mmol/L (ref 3.5–5.1)
Sodium: 137 mmol/L (ref 135–145)

## 2017-07-31 LAB — SURGICAL PCR SCREEN
MRSA, PCR: NEGATIVE
Staphylococcus aureus: POSITIVE — AB

## 2017-08-09 NOTE — H&P (Signed)
Jasmine Richard is an 61 y.o. female.    Chief Complaint: Left knee OA and pain with retained orthopaedic hardware  Procedure:   Removal of retained orthopaedic hardware in the left leg  HPI: Pt is a 61 y.o. female complaining of left leg/knee pain for ~1.5 years. Pain had continually increased since the beginning. X-rays in the clinic show end-stage arthritic changes of the left knee as well as retained orthopaedic hardware. Pt has tried various conservative treatments which have failed to alleviate their symptoms, including NSAIDs, cortisone injections, assistance devices and activity modification. Various options are discussed with the patient. Risks, benefits and expectations were discussed with the patient. Patient understand the risks, benefits and expectations and wishes to proceed with surgery.    PCP: Merlene Laughter, MD  D/C Plans:       Home   Post-op Meds:       No Rx given  Tranexamic Acid:      To be given - IV    Decadron:      Is to be given  FYI:     ASA  [place]  DME:   Pt already has equipment  PT:    No PT   PMH: Past Medical History:  Diagnosis Date  . ADD (attention deficit disorder)   . Anxiety   . Depression   . Diverticulosis   . GERD (gastroesophageal reflux disease)   . H/O ETOH abuse    recovered  . Headache   . Heart murmur   . IDA (iron deficiency anemia)   . Insomnia   . Osteoarthritis   . Psoriatic arthritis (HCC)    Dr. Zenovia Jordan    PSH: Past Surgical History:  Procedure Laterality Date  . CARPAL TUNNEL RELEASE  09/2014  . CESAREAN SECTION     x2  . CHOLECYSTECTOMY  1984  . COLONOSCOPY  2005   recheck in 5 years  . FEMUR CLOSED REDUCTION Left 11/19/2015   Procedure: CLOSED REDUCTION LEFT DISTAL FEMUR;  Surgeon: Beverely Low, MD;  Location: [facility];  Service: Orthopedics;  Laterality: Left;  Marland Kitchen GASTRIC BYPASS  2002   lost 140 lb  . HARDWARE REMOVAL Left 03/22/2016   Procedure: LEFT KNEE HARDWARE REMOVAL;  Surgeon: Tarry Kos, MD;  Location: [facility];  Service: Orthopedics;  Laterality: Left;  . HYSTEROSCOPY W/D&C  07   no polyps  . ORIF FEMUR FRACTURE Left 11/20/2015   Procedure: OPEN REDUCTION INTERNAL FIXATION (ORIF) DISTAL FEMUR FRACTURE;  Surgeon: Tarry Kos, MD;  Location: [facility];  Service: Orthopedics;  Laterality: Left;  . TARSAL METATARSAL ARTHRODESIS  11/28/2012   Procedure: TARSAL METATARSAL FUSION;  Surgeon: Sherri Rad, MD;  Location: [facility];  Service: Orthopedics;  Laterality: Right;  right 2nd and 3rd TMT joint fusion local bone graft stress x ray foot   . TEE WITHOUT CARDIOVERSION N/A 01/07/2015   Procedure: TRANSESOPHAGEAL ECHOCARDIOGRAM (TEE);  Surgeon: Lars Masson, MD;  Location: [facility] END[facility]COPY;  Service: Cardiovascular;  Laterality: N/A;  . TONSILLECTOMY    . TONSILLECTOMY AND ADENOIDECTOMY  1960  . ULNAR TUNNEL RELEASE  09/2014    Social History:  reports that she has never smoked. She has never used smokeless tobacco. She reports that she does not drink alcohol or use drugs.  Allergies:  No Known Allergies  Medications: No current facility-administered medications for this encounter.    Current Outpatient Prescriptions  Medication Sig Dispense Refill  . Adalimumab (  HUMIRA) 40 MG/0.8ML PSKT Inject 40 mg into the skin every 14 (fourteen) days.    . Biotin 5000 MCG CAPS Take 5,000 mcg by mouth daily.    . Calcium Carbonate-Vit D-Min (CALCIUM 1200 PO) Take 1 tablet by mouth 2 (two) times daily.    . cyanocobalamin (,VITAMIN B-12,) 1000 MCG/ML injection Inject 1 mL (1,000 mcg total) into the muscle every 30 (thirty) days. 10 mL 1  . escitalopram (LEXAPRO) 20 MG tablet Take 1 tablet (20 mg total) by mouth daily. 30 tablet 2  . ferrous sulfate 325 (65 FE) MG tablet Take 325 mg by mouth daily with breakfast.    . HYDROcodone-acetaminophen (NORCO/VICODIN) 5-325 MG tablet Take 1 tablet by mouth daily as needed for moderate pain.    Marland Kitchen loperamide  (IMODIUM) 2 MG capsule Take 2-4 mg by mouth See admin instructions. Take 4mg  at onset of diarrhea then 2mg  every 2 hours as needed for diarrhea. Do not exceed 16mg  in 24hrs.    MAGNESIUM-POTASSIUM PO Take 1 tablet by mouth 2 (two) times daily.    . meloxicam (MOBIC) 15 MG tablet Take 15 mg by mouth daily.     . Multiple Vitamins-Minerals (MULTIVITAMIN WITH MINERALS) tablet Take 0.5 tablets by mouth 2 (two) times daily.     . naltrexone (DEPADE) 50 MG tablet Take 50 mg by mouth at bedtime.  0  . omeprazole (PRILOSEC) 20 MG capsule Take 20 mg by mouth 2 (two) times daily before a meal.    . oxybutynin (DITROPAN-XL) 5 MG 24 hr tablet Take 5 mg by mouth daily.    . Probiotic Product (PROBIOTIC DAILY PO) Take 1 capsule by mouth daily.    PSEUDOEPHEDRINE-GUAIFENESIN CR PO Take 1 tablet by mouth daily.    . SUMAtriptan (IMITREX) 100 MG tablet May repeat in 2 hours if headache persists or recurs. (Patient taking differently: Take 100 mg by mouth every 2 (two) hours as needed for migraine. May repeat in 2 hours if headache persists or recurs.) 10 tablet 5  . traZODone (DESYREL) 100 MG tablet Take 100 mg by mouth at bedtime.  0  . valACYclovir (VALTREX) 500 MG tablet Take 500 mg by mouth See admin instructions. Take 4 tablets at first sign of cold sores and 4 tablets 12 hours later.  0  . vitamin C (ASCORBIC ACID) 500 MG tablet Take 500 mg by mouth 2 (two) times daily.        Review of Systems  Constitutional: Negative.   HENT: Negative.   Eyes: Negative.   Respiratory: Negative.   Cardiovascular: Negative.   Gastrointestinal: Positive for heartburn.  Genitourinary: Positive for urgency.  Musculoskeletal: Positive for joint pain.  Skin: Negative.   Neurological: Positive for headaches.  Endo/Heme/Allergies: Negative.   Psychiatric/Behavioral: Positive for depression. The patient is nervous/anxious and has insomnia.        Physical Exam  Constitutional: She is oriented to person, place,  and time. She appears well-developed.  HENT:  Head: Normocephalic.  Eyes: Pupils are equal, round, and reactive to light.  Neck: Neck supple. No JVD present. No tracheal deviation present. No thyromegaly present.  Cardiovascular: Normal rate, regular rhythm and intact distal pulses.   Murmur heard. Respiratory: Effort normal and breath sounds normal. No respiratory distress. She has no wheezes.  GI: Soft. There is no tenderness. There is no guarding.  Musculoskeletal:       Right knee: She exhibits decreased range of motion, swelling, laceration (healed previous incision) and bony  tenderness. She exhibits no ecchymosis, no deformity and no erythema. Tenderness found.  Lymphadenopathy:    She has no cervical adenopathy.  Neurological: She is alert and oriented to person, place, and time.  Skin: Skin is warm and dry.  Psychiatric: She has a normal mood and affect.       Assessment/Plan Assessment:   Left knee OA and pain with retained orthopaedic hardware   Plan: Patient will undergo removal of retained orthopaedic hardware in the left leg on 08/20/2017 per Dr. Charlann Boxer at [facility]. Risks benefits and expectations were discussed with the patient. Patient understand risks, benefits and expectations and wishes to proceed.    Anastasio Auerbach Keyonta Barradas   PA-C  08/09/2017, 10:50 AM

## 2017-08-20 ENCOUNTER — Encounter (HOSPITAL_COMMUNITY): Payer: Self-pay | Admitting: *Deleted

## 2017-08-20 ENCOUNTER — Encounter (HOSPITAL_COMMUNITY)
Admission: RE | Disposition: A | Discharge status: Home / Self Care | Payer: Self-pay | Source: Ambulatory Visit | Attending: Orthopedic Surgery

## 2017-08-20 ENCOUNTER — Ambulatory Visit (HOSPITAL_COMMUNITY): Admitting: *Deleted

## 2017-08-20 ENCOUNTER — Observation Stay (HOSPITAL_COMMUNITY)
Admission: RE | Admit: 2017-08-20 | Discharge: 2017-08-21 | Disposition: A | Discharge status: Home / Self Care | Source: Ambulatory Visit | Attending: Orthopedic Surgery | Admitting: Orthopedic Surgery

## 2017-08-20 DIAGNOSIS — Z791 Long term (current) use of non-steroidal anti-inflammatories (NSAID): Secondary | ICD-10-CM | POA: Insufficient documentation

## 2017-08-20 DIAGNOSIS — Z79899 Other long term (current) drug therapy: Secondary | ICD-10-CM | POA: Diagnosis not present

## 2017-08-20 DIAGNOSIS — D509 Iron deficiency anemia, unspecified: Secondary | ICD-10-CM | POA: Diagnosis not present

## 2017-08-20 DIAGNOSIS — Z7982 Long term (current) use of aspirin: Secondary | ICD-10-CM | POA: Insufficient documentation

## 2017-08-20 DIAGNOSIS — Z472 Encounter for removal of internal fixation device: Secondary | ICD-10-CM | POA: Diagnosis not present

## 2017-08-20 DIAGNOSIS — F419 Anxiety disorder, unspecified: Secondary | ICD-10-CM | POA: Diagnosis not present

## 2017-08-20 DIAGNOSIS — F329 Major depressive disorder, single episode, unspecified: Secondary | ICD-10-CM | POA: Insufficient documentation

## 2017-08-20 DIAGNOSIS — M1712 Unilateral primary osteoarthritis, left knee: Secondary | ICD-10-CM | POA: Diagnosis not present

## 2017-08-20 DIAGNOSIS — K219 Gastro-esophageal reflux disease without esophagitis: Secondary | ICD-10-CM | POA: Diagnosis not present

## 2017-08-20 DIAGNOSIS — L4050 Arthropathic psoriasis, unspecified: Secondary | ICD-10-CM | POA: Insufficient documentation

## 2017-08-20 DIAGNOSIS — G4700 Insomnia, unspecified: Secondary | ICD-10-CM | POA: Insufficient documentation

## 2017-08-20 DIAGNOSIS — Z969 Presence of functional implant, unspecified: Secondary | ICD-10-CM

## 2017-08-20 DIAGNOSIS — Z9884 Bariatric surgery status: Secondary | ICD-10-CM | POA: Insufficient documentation

## 2017-08-20 DIAGNOSIS — Z79891 Long term (current) use of opiate analgesic: Secondary | ICD-10-CM | POA: Diagnosis not present

## 2017-08-20 HISTORY — PX: HARDWARE REMOVAL: SHX979

## 2017-08-20 SURGERY — REMOVAL, HARDWARE
Anesthesia: General | Laterality: Left

## 2017-08-20 MED ORDER — PROMETHAZINE HCL 25 MG/ML IJ SOLN
INTRAMUSCULAR | Status: AC
Start: 2017-08-20 — End: 2017-08-20
  Administered 2017-08-20: 6.25 mg via INTRAVENOUS
  Filled 2017-08-20: qty 1

## 2017-08-20 MED ORDER — SUMATRIPTAN SUCCINATE 50 MG PO TABS
100.0000 mg | ORAL_TABLET | ORAL | Status: DC | PRN
  Filled 2017-08-20: qty 2

## 2017-08-20 MED ORDER — DEXAMETHASONE SODIUM PHOSPHATE 10 MG/ML IJ SOLN
10.0000 mg | Freq: Once | INTRAMUSCULAR | Status: AC
  Administered 2017-08-21: 10 mg via INTRAVENOUS
  Filled 2017-08-20: qty 1

## 2017-08-20 MED ORDER — PROMETHAZINE HCL 25 MG/ML IJ SOLN
6.2500 mg | INTRAMUSCULAR | Status: DC | PRN
  Administered 2017-08-20: 6.25 mg via INTRAVENOUS

## 2017-08-20 MED ORDER — PROPOFOL 10 MG/ML IV BOLUS
INTRAVENOUS | Status: DC | PRN
  Administered 2017-08-20: 200 mg via INTRAVENOUS

## 2017-08-20 MED ORDER — HYDROMORPHONE HCL-NACL 0.5-0.9 MG/ML-% IV SOSY
0.5000 mg | PREFILLED_SYRINGE | INTRAVENOUS | Status: DC | PRN
  Administered 2017-08-20: 15:00:00 0.5 mg via INTRAVENOUS
  Administered 2017-08-21: 1 mg via INTRAVENOUS
  Filled 2017-08-20: qty 1
  Filled 2017-08-20: qty 2

## 2017-08-20 MED ORDER — LIDOCAINE 2% (20 MG/ML) 5 ML SYRINGE
INTRAMUSCULAR | Status: AC
  Filled 2017-08-20: qty 5

## 2017-08-20 MED ORDER — KETOROLAC TROMETHAMINE 30 MG/ML IJ SOLN
INTRAMUSCULAR | Status: DC | PRN
  Administered 2017-08-20: 15 mg via INTRAVENOUS

## 2017-08-20 MED ORDER — METHOCARBAMOL 500 MG PO TABS: 500.0000 mg | ORAL_TABLET | Freq: Four times a day (QID) | ORAL | Status: DC | PRN

## 2017-08-20 MED ORDER — HYDROMORPHONE HCL-NACL 0.5-0.9 MG/ML-% IV SOSY
PREFILLED_SYRINGE | INTRAVENOUS | Status: AC
  Administered 2017-08-20: 0.5 mg via INTRAVENOUS
  Filled 2017-08-20: qty 3

## 2017-08-20 MED ORDER — METOCLOPRAMIDE HCL 5 MG PO TABS: 5.0000 mg | ORAL_TABLET | Freq: Three times a day (TID) | ORAL | Status: DC | PRN

## 2017-08-20 MED ORDER — MIDAZOLAM HCL 5 MG/5ML IJ SOLN
INTRAMUSCULAR | Status: DC | PRN
  Administered 2017-08-20: 2 mg via INTRAVENOUS

## 2017-08-20 MED ORDER — MENTHOL 3 MG MT LOZG: 1.0000 | LOZENGE | OROMUCOSAL | Status: DC | PRN

## 2017-08-20 MED ORDER — HYDROCODONE-ACETAMINOPHEN 7.5-325 MG PO TABS
1.0000 | ORAL_TABLET | ORAL | Status: DC
  Administered 2017-08-20: 1 via ORAL
  Administered 2017-08-20 – 2017-08-21 (×5): 2 via ORAL
  Filled 2017-08-20 (×4): qty 2
  Filled 2017-08-20: qty 1
  Filled 2017-08-20: qty 2

## 2017-08-20 MED ORDER — FENTANYL CITRATE (PF) 100 MCG/2ML IJ SOLN
INTRAMUSCULAR | Status: AC
  Filled 2017-08-20: qty 2

## 2017-08-20 MED ORDER — ONDANSETRON HCL 4 MG/2ML IJ SOLN
4.0000 mg | Freq: Four times a day (QID) | INTRAMUSCULAR | Status: DC | PRN
  Administered 2017-08-20: 4 mg via INTRAVENOUS
  Filled 2017-08-20: qty 2

## 2017-08-20 MED ORDER — METOCLOPRAMIDE HCL 5 MG/ML IJ SOLN: 5.0000 mg | Freq: Three times a day (TID) | INTRAMUSCULAR | Status: DC | PRN

## 2017-08-20 MED ORDER — CHLORHEXIDINE GLUCONATE 4 % EX LIQD: 60.0000 mL | Freq: Once | CUTANEOUS | Status: DC

## 2017-08-20 MED ORDER — FOLIC ACID 1 MG PO TABS
1.0000 mg | ORAL_TABLET | Freq: Every day | ORAL | Status: DC
  Administered 2017-08-20 – 2017-08-21 (×2): 1 mg via ORAL
  Filled 2017-08-20 (×2): qty 1

## 2017-08-20 MED ORDER — LORAZEPAM 2 MG/ML IJ SOLN: 1.0000 mg | Freq: Four times a day (QID) | INTRAMUSCULAR | Status: DC | PRN

## 2017-08-20 MED ORDER — TRAZODONE HCL 100 MG PO TABS
100.0000 mg | ORAL_TABLET | Freq: Every day | ORAL | Status: DC
  Administered 2017-08-20: 100 mg via ORAL
  Filled 2017-08-20: qty 1

## 2017-08-20 MED ORDER — LORAZEPAM 2 MG/ML IJ SOLN: 0.0000 mg | Freq: Two times a day (BID) | INTRAMUSCULAR | Status: DC

## 2017-08-20 MED ORDER — ACETAMINOPHEN 10 MG/ML IV SOLN
INTRAVENOUS | Status: AC
  Filled 2017-08-20: qty 100

## 2017-08-20 MED ORDER — BISACODYL 10 MG RE SUPP: 10.0000 mg | Freq: Every day | RECTAL | Status: DC | PRN

## 2017-08-20 MED ORDER — ONDANSETRON HCL 4 MG/2ML IJ SOLN
INTRAMUSCULAR | Status: DC | PRN
  Administered 2017-08-20: 4 mg via INTRAVENOUS

## 2017-08-20 MED ORDER — CEFAZOLIN SODIUM-DEXTROSE 2-4 GM/100ML-% IV SOLN
2.0000 g | Freq: Four times a day (QID) | INTRAVENOUS | Status: AC
  Administered 2017-08-20 (×2): 2 g via INTRAVENOUS
  Filled 2017-08-20 (×2): qty 100

## 2017-08-20 MED ORDER — POLYETHYLENE GLYCOL 3350 17 G PO PACK
17.0000 g | PACK | Freq: Two times a day (BID) | ORAL | Status: DC
  Administered 2017-08-21: 10:00:00 17 g via ORAL
  Filled 2017-08-20: qty 1

## 2017-08-20 MED ORDER — SODIUM CHLORIDE 0.9 % IV SOLN
INTRAVENOUS | Status: DC
  Administered 2017-08-20 – 2017-08-21 (×2): via INTRAVENOUS

## 2017-08-20 MED ORDER — SUCCINYLCHOLINE CHLORIDE 200 MG/10ML IV SOSY
PREFILLED_SYRINGE | INTRAVENOUS | Status: AC
  Filled 2017-08-20: qty 10

## 2017-08-20 MED ORDER — ALUM & MAG HYDROXIDE-SIMETH 200-200-20 MG/5ML PO SUSP: 15.0000 mL | ORAL | Status: DC | PRN

## 2017-08-20 MED ORDER — HYDROMORPHONE HCL-NACL 0.5-0.9 MG/ML-% IV SOSY
PREFILLED_SYRINGE | INTRAVENOUS | Status: AC
  Administered 2017-08-20: 0.5 mg via INTRAVENOUS
  Filled 2017-08-20: qty 1

## 2017-08-20 MED ORDER — SUCCINYLCHOLINE CHLORIDE 200 MG/10ML IV SOSY
PREFILLED_SYRINGE | INTRAVENOUS | Status: DC | PRN
  Administered 2017-08-20: 100 mg via INTRAVENOUS

## 2017-08-20 MED ORDER — SUGAMMADEX SODIUM 200 MG/2ML IV SOLN
INTRAVENOUS | Status: DC | PRN
  Administered 2017-08-20: 150 mg via INTRAVENOUS

## 2017-08-20 MED ORDER — NON FORMULARY: 20.0000 mg | Freq: Two times a day (BID) | Status: DC

## 2017-08-20 MED ORDER — DEXTROSE 5 % IV SOLN
500.0000 mg | Freq: Four times a day (QID) | INTRAVENOUS | Status: DC | PRN
  Administered 2017-08-20: 500 mg via INTRAVENOUS
  Filled 2017-08-20: qty 550

## 2017-08-20 MED ORDER — MIDAZOLAM HCL 2 MG/2ML IJ SOLN
INTRAMUSCULAR | Status: AC
  Filled 2017-08-20: qty 2

## 2017-08-20 MED ORDER — TRANEXAMIC ACID 1000 MG/10ML IV SOLN
1000.0000 mg | Freq: Once | INTRAVENOUS | Status: AC
  Administered 2017-08-20: 15:00:00 1000 mg via INTRAVENOUS
  Filled 2017-08-20: qty 1100

## 2017-08-20 MED ORDER — FERROUS SULFATE 325 (65 FE) MG PO TABS
325.0000 mg | ORAL_TABLET | Freq: Three times a day (TID) | ORAL | Status: DC
  Administered 2017-08-20 – 2017-08-21 (×2): 325 mg via ORAL
  Filled 2017-08-20 (×2): qty 1

## 2017-08-20 MED ORDER — DOCUSATE SODIUM 100 MG PO CAPS
100.0000 mg | ORAL_CAPSULE | Freq: Two times a day (BID) | ORAL | Status: DC
  Administered 2017-08-20 – 2017-08-21 (×2): 100 mg via ORAL
  Filled 2017-08-20 (×2): qty 1

## 2017-08-20 MED ORDER — ROCURONIUM BROMIDE 10 MG/ML (PF) SYRINGE
PREFILLED_SYRINGE | INTRAVENOUS | Status: DC | PRN
  Administered 2017-08-20: 30 mg via INTRAVENOUS

## 2017-08-20 MED ORDER — SPIRITUS FRUMENTI
1.0000 | Freq: Once | ORAL | Status: DC
  Filled 2017-08-20: qty 1

## 2017-08-20 MED ORDER — ASPIRIN 81 MG PO CHEW
81.0000 mg | CHEWABLE_TABLET | Freq: Two times a day (BID) | ORAL | Status: DC
  Administered 2017-08-20 – 2017-08-21 (×2): 81 mg via ORAL
  Filled 2017-08-20 (×2): qty 1

## 2017-08-20 MED ORDER — DEXAMETHASONE SODIUM PHOSPHATE 10 MG/ML IJ SOLN
INTRAMUSCULAR | Status: AC
  Filled 2017-08-20: qty 1

## 2017-08-20 MED ORDER — DIPHENHYDRAMINE HCL 25 MG PO CAPS: 25.0000 mg | ORAL_CAPSULE | Freq: Four times a day (QID) | ORAL | Status: DC | PRN

## 2017-08-20 MED ORDER — PHENOL 1.4 % MT LIQD: 1.0000 | OROMUCOSAL | Status: DC | PRN

## 2017-08-20 MED ORDER — LIDOCAINE 2% (20 MG/ML) 5 ML SYRINGE
INTRAMUSCULAR | Status: DC | PRN
  Administered 2017-08-20: 100 mg via INTRAVENOUS

## 2017-08-20 MED ORDER — SUGAMMADEX SODIUM 200 MG/2ML IV SOLN
INTRAVENOUS | Status: AC
  Filled 2017-08-20: qty 2

## 2017-08-20 MED ORDER — BUPIVACAINE-EPINEPHRINE (PF) 0.25% -1:200000 IJ SOLN
INTRAMUSCULAR | Status: AC
  Filled 2017-08-20: qty 30

## 2017-08-20 MED ORDER — PROPOFOL 10 MG/ML IV BOLUS
INTRAVENOUS | Status: AC
  Filled 2017-08-20: qty 20

## 2017-08-20 MED ORDER — CELECOXIB 200 MG PO CAPS
200.0000 mg | ORAL_CAPSULE | Freq: Two times a day (BID) | ORAL | Status: DC
  Administered 2017-08-20 – 2017-08-21 (×2): 200 mg via ORAL
  Filled 2017-08-20 (×2): qty 1

## 2017-08-20 MED ORDER — MAGNESIUM CITRATE PO SOLN: 1.0000 | Freq: Once | ORAL | Status: DC | PRN

## 2017-08-20 MED ORDER — OXYBUTYNIN CHLORIDE ER 5 MG PO TB24
5.0000 mg | ORAL_TABLET | Freq: Every day | ORAL | Status: DC
  Administered 2017-08-20 – 2017-08-21 (×2): 5 mg via ORAL
  Filled 2017-08-20 (×2): qty 1

## 2017-08-20 MED ORDER — DEXAMETHASONE SODIUM PHOSPHATE 10 MG/ML IJ SOLN
INTRAMUSCULAR | Status: DC | PRN
  Administered 2017-08-20: 10 mg via INTRAVENOUS

## 2017-08-20 MED ORDER — 0.9 % SODIUM CHLORIDE (POUR BTL) OPTIME
TOPICAL | Status: DC | PRN
Start: 2017-08-20 — End: 2017-08-20
  Administered 2017-08-20: 1000 mL

## 2017-08-20 MED ORDER — ONDANSETRON HCL 4 MG/2ML IJ SOLN
INTRAMUSCULAR | Status: AC
  Filled 2017-08-20: qty 2

## 2017-08-20 MED ORDER — VITAMIN B-1 100 MG PO TABS
100.0000 mg | ORAL_TABLET | Freq: Every day | ORAL | Status: DC
  Administered 2017-08-20 – 2017-08-21 (×2): 100 mg via ORAL
  Filled 2017-08-20 (×2): qty 1

## 2017-08-20 MED ORDER — ACETAMINOPHEN 10 MG/ML IV SOLN
1000.0000 mg | Freq: Once | INTRAVENOUS | Status: AC
  Administered 2017-08-20: 1000 mg via INTRAVENOUS

## 2017-08-20 MED ORDER — HYDROMORPHONE HCL-NACL 0.5-0.9 MG/ML-% IV SOSY
0.2500 mg | PREFILLED_SYRINGE | INTRAVENOUS | Status: DC | PRN
  Administered 2017-08-20 (×4): 0.5 mg via INTRAVENOUS

## 2017-08-20 MED ORDER — CEFAZOLIN SODIUM-DEXTROSE 2-4 GM/100ML-% IV SOLN
2.0000 g | INTRAVENOUS | Status: AC
  Administered 2017-08-20: 2 g via INTRAVENOUS

## 2017-08-20 MED ORDER — LORAZEPAM 1 MG PO TABS
1.0000 mg | ORAL_TABLET | Freq: Four times a day (QID) | ORAL | Status: DC | PRN
  Administered 2017-08-20 – 2017-08-21 (×3): 1 mg via ORAL
  Filled 2017-08-20 (×3): qty 1

## 2017-08-20 MED ORDER — ADULT MULTIVITAMIN W/MINERALS CH
1.0000 | ORAL_TABLET | Freq: Every day | ORAL | Status: DC
  Administered 2017-08-20 – 2017-08-21 (×2): 1 via ORAL
  Filled 2017-08-20 (×2): qty 1

## 2017-08-20 MED ORDER — ONDANSETRON HCL 4 MG PO TABS: 4.0000 mg | ORAL_TABLET | Freq: Four times a day (QID) | ORAL | Status: DC | PRN

## 2017-08-20 MED ORDER — LORAZEPAM 2 MG/ML IJ SOLN
0.0000 mg | Freq: Four times a day (QID) | INTRAMUSCULAR | Status: DC
  Administered 2017-08-20: 2 mg via INTRAVENOUS
  Administered 2017-08-21 (×2): 1 mg via INTRAVENOUS
  Filled 2017-08-20 (×3): qty 1

## 2017-08-20 MED ORDER — ROCURONIUM BROMIDE 50 MG/5ML IV SOSY
PREFILLED_SYRINGE | INTRAVENOUS | Status: AC
  Filled 2017-08-20: qty 5

## 2017-08-20 MED ORDER — LACTATED RINGERS IV SOLN
INTRAVENOUS | Status: DC
  Administered 2017-08-20: 09:00:00 via INTRAVENOUS

## 2017-08-20 MED ORDER — FENTANYL CITRATE (PF) 100 MCG/2ML IJ SOLN
INTRAMUSCULAR | Status: DC | PRN
  Administered 2017-08-20: 25 ug via INTRAVENOUS
  Administered 2017-08-20 (×2): 50 ug via INTRAVENOUS
  Administered 2017-08-20: 25 ug via INTRAVENOUS
  Administered 2017-08-20 (×2): 50 ug via INTRAVENOUS

## 2017-08-20 MED ORDER — OMEPRAZOLE 20 MG PO CPDR
20.0000 mg | DELAYED_RELEASE_CAPSULE | Freq: Two times a day (BID) | ORAL | Status: DC
  Administered 2017-08-20 – 2017-08-21 (×2): 20 mg via ORAL
  Filled 2017-08-20 (×2): qty 1

## 2017-08-20 MED ORDER — CEFAZOLIN SODIUM-DEXTROSE 2-4 GM/100ML-% IV SOLN
INTRAVENOUS | Status: AC
  Filled 2017-08-20: qty 100

## 2017-08-20 MED ORDER — THIAMINE HCL 100 MG/ML IJ SOLN
100.0000 mg | Freq: Every day | INTRAMUSCULAR | Status: DC
  Filled 2017-08-20 (×2): qty 1

## 2017-08-20 SURGICAL SUPPLY — 54 items
BAG ZIPLOCK 12X15 (MISCELLANEOUS) ×3 IMPLANT
BANDAGE ACE 6X5 VEL STRL LF (GAUZE/BANDAGES/DRESSINGS) ×3 IMPLANT
BANDAGE ESMARK 6X9 LF (GAUZE/BANDAGES/DRESSINGS) ×1 IMPLANT
BLADE SAW SGTL 11.0X1.19X90.0M (BLADE) IMPLANT
BNDG ESMARK 6X9 LF (GAUZE/BANDAGES/DRESSINGS) ×3
CLOSURE WOUND 1/2 X4 (GAUZE/BANDAGES/DRESSINGS) ×2
COVER MAYO STAND STRL (DRAPES) IMPLANT
COVER SURGICAL LIGHT HANDLE (MISCELLANEOUS) ×3 IMPLANT
CUFF TOURN SGL QUICK 34 (TOURNIQUET CUFF) ×2
CUFF TRNQT CYL 34X4X40X1 (TOURNIQUET CUFF) ×1 IMPLANT
DERMABOND ADVANCED (GAUZE/BANDAGES/DRESSINGS) ×2
DERMABOND ADVANCED .7 DNX12 (GAUZE/BANDAGES/DRESSINGS) ×1 IMPLANT
DRAPE C-ARM 42X120 X-RAY (DRAPES) IMPLANT
DRAPE EXTREMITY T 121X128X90 (DRAPE) ×3 IMPLANT
DRAPE OEC MINIVIEW 54X84 (DRAPES) IMPLANT
DRAPE POUCH INSTRU U-SHP 10X18 (DRAPES) ×3 IMPLANT
DRAPE STERI IOBAN 125X83 (DRAPES) ×3 IMPLANT
DRAPE U-SHAPE 47X51 STRL (DRAPES) ×3 IMPLANT
DRESSING AQUACEL AG SP 3.5X10 (GAUZE/BANDAGES/DRESSINGS) ×1 IMPLANT
DRSG AQUACEL AG ADV 3.5X 4 (GAUZE/BANDAGES/DRESSINGS) ×3 IMPLANT
DRSG AQUACEL AG SP 3.5X10 (GAUZE/BANDAGES/DRESSINGS) ×3
DRSG EMULSION OIL 3X16 NADH (GAUZE/BANDAGES/DRESSINGS) ×3 IMPLANT
DRSG PAD ABDOMINAL 8X10 ST (GAUZE/BANDAGES/DRESSINGS) ×3 IMPLANT
DURAPREP 26ML APPLICATOR (WOUND CARE) ×6 IMPLANT
ELECT REM PT RETURN 15FT ADLT (MISCELLANEOUS) ×3 IMPLANT
GAUZE SPONGE 4X4 12PLY STRL (GAUZE/BANDAGES/DRESSINGS) ×3 IMPLANT
GLOVE BIOGEL M 7.0 STRL (GLOVE) IMPLANT
GLOVE BIOGEL PI IND STRL 7.5 (GLOVE) ×1 IMPLANT
GLOVE BIOGEL PI IND STRL 8.5 (GLOVE) ×1 IMPLANT
GLOVE BIOGEL PI INDICATOR 7.5 (GLOVE) ×2
GLOVE BIOGEL PI INDICATOR 8.5 (GLOVE) ×2
GLOVE ECLIPSE 8.0 STRL XLNG CF (GLOVE) IMPLANT
GLOVE ORTHO TXT STRL SZ7.5 (GLOVE) ×6 IMPLANT
GLOVE SURG ORTHO 8.0 STRL STRW (GLOVE) ×3 IMPLANT
GOWN STRL REUS W/TWL LRG LVL3 (GOWN DISPOSABLE) ×3 IMPLANT
GOWN STRL REUS W/TWL XL LVL3 (GOWN DISPOSABLE) ×6 IMPLANT
KIT BASIN OR (CUSTOM PROCEDURE TRAY) ×3 IMPLANT
MANIFOLD NEPTUNE II (INSTRUMENTS) ×3 IMPLANT
NS IRRIG 1000ML POUR BTL (IV SOLUTION) ×3 IMPLANT
PACK TOTAL JOINT (CUSTOM PROCEDURE TRAY) ×3 IMPLANT
PADDING CAST COTTON 6X4 STRL (CAST SUPPLIES) ×3 IMPLANT
POSITIONER SURGICAL ARM (MISCELLANEOUS) ×3 IMPLANT
STAPLER VISISTAT 35W (STAPLE) IMPLANT
STRIP CLOSURE SKIN 1/2X4 (GAUZE/BANDAGES/DRESSINGS) ×4 IMPLANT
SUCTION FRAZIER HANDLE 10FR (MISCELLANEOUS) ×2
SUCTION TUBE FRAZIER 10FR DISP (MISCELLANEOUS) ×1 IMPLANT
SUT MNCRL AB 4-0 PS2 18 (SUTURE) ×6 IMPLANT
SUT STRATAFIX 0 PDS 27 VIOLET (SUTURE) ×3
SUT VIC AB 1 CT1 36 (SUTURE) ×6 IMPLANT
SUT VIC AB 2-0 CT1 27 (SUTURE) ×4
SUT VIC AB 2-0 CT1 TAPERPNT 27 (SUTURE) ×2 IMPLANT
SUTURE STRATFX 0 PDS 27 VIOLET (SUTURE) ×1 IMPLANT
TOWEL OR 17X26 10 PK STRL BLUE (TOWEL DISPOSABLE) ×6 IMPLANT
WATER STERILE IRR 1500ML POUR (IV SOLUTION) ×3 IMPLANT

## 2017-08-20 NOTE — Anesthesia Postprocedure Evaluation (Signed)
Anesthesia Post Note  Patient: Jasmine Richard  Procedure(s) Performed: Procedure(s) (LRB): Removal of retained hardware in left distal femur (Left)     Patient location during evaluation: PACU Anesthesia Type: General Level of consciousness: awake and alert Pain management: pain level controlled Vital Signs Assessment: post-procedure vital signs reviewed and stable Respiratory status: spontaneous breathing, nonlabored ventilation, respiratory function stable and patient connected to nasal cannula oxygen Cardiovascular status: blood pressure returned to baseline and stable Postop Assessment: no apparent nausea or vomiting Anesthetic complications: no    Last Vitals:  Vitals:   08/20/17 1345 08/20/17 1400  BP: 112/70 (!) 114/58  Pulse: (!) 101 96  Resp: 18 17  Temp: 36.7 C 37.1 C  SpO2: 96% 98%    Last Pain:  Vitals:   08/20/17 1345  TempSrc:   PainSc: 5                  Kennieth Rad

## 2017-08-20 NOTE — Brief Op Note (Signed)
08/20/2017  11:25 AM  PATIENT:  Stefani Dama  61 y.o. female  PRE-OPERATIVE DIAGNOSIS:  Retained hardware left distal femur  POST-OPERATIVE DIAGNOSIS:  Retained hardware left distal femur  PROCEDURE:  Procedure(s) with comments: Removal of retained hardware in left distal femur (Left) - 90 mins  SURGEON:  Surgeon(s) and Role:    Durene Romans, MD - Primary  PHYSICIAN ASSISTANT: Lanney Gins, PA-C  ASSISTANTS: Velta Addison, RNFA  ANESTHESIA:   general  EBL:  Total I/O In: 1000 [I.V.:1000] Out: - <50 cc after tourniquet let down  BLOOD ADMINISTERED:none  DRAINS: none   LOCAL MEDICATIONS USED:  NONE  SPECIMEN:  No Specimen  DISPOSITION OF SPECIMEN:  N/A  COUNTS:  YES  TOURNIQUET:   Total Tourniquet Time Documented: Thigh (Left) - 33 minutes Total: Thigh (Left) - 33 minutes   DICTATION: .Other Dictation: Dictation Number [id number]  PLAN OF CARE: Admit for overnight observation  PATIENT DISPOSITION:  PACU - hemodynamically stable.   Delay start of Pharmacological VTE agent (>24hrs) due to surgical blood loss or risk of bleeding: no

## 2017-08-20 NOTE — Interval H&P Note (Signed)
History and Physical Interval Note:  08/20/2017 9:06 AM  Jasmine Richard  has presented today for surgery, with the diagnosis of Retained hardware left distal femur  The various methods of treatment have been discussed with the patient and family. After consideration of risks, benefits and other options for treatment, the patient has consented to  Procedure(s) with comments: Removal of retained hardware in left distal femur (Left) - 90 mins as a surgical intervention .  The patient's history has been reviewed, patient examined, no change in status, stable for surgery.  I have reviewed the patient's chart and labs.  Questions were answered to the patient's satisfaction.     [name]

## 2017-08-20 NOTE — Anesthesia Preprocedure Evaluation (Addendum)
Anesthesia Evaluation  Patient identified by MRN, date of birth, ID band Patient awake    Reviewed: Allergy & Precautions, NPO status , Patient's Chart, lab work & pertinent test results  Airway Mallampati: III  TM Distance: >3 FB Neck ROM: Full  Mouth opening: Limited Mouth Opening  Dental  (+) Dental Advisory Given   Pulmonary neg pulmonary ROS,    breath sounds clear to auscultation       Cardiovascular + Valvular Problems/Murmurs  Rhythm:Regular Rate:Normal  01/2017: Left ventricle: The cavity size was normal. Systolic function was  normal. The estimated ejection fraction was in the range of 55%  to 60%. Wall motion was normal; there were no regional wall  motion abnormalities. There was an increased relative   contribution of atrial contraction to ventricular filling, which  may be due to hypovolemia. Left ventricular diastolic function  parameters were normal. - Aortic valve: There was mild to moderate regurgitation directed  centrally in the LVOT.   Neuro/Psych  Headaches, Anxiety Depression    GI/Hepatic GERD  Medicated,(+)     substance abuse  alcohol use,   Endo/Other  negative endocrine ROS  Renal/GU negative Renal ROS     Musculoskeletal  (+) Arthritis ,   Abdominal   Peds  Hematology  (+) anemia ,   Anesthesia Other Findings   Reproductive/Obstetrics                            Lab Results  Component Value Date   WBC 2.5 (L) 07/31/2017   HGB 13.2 07/31/2017   HCT 37.2 07/31/2017   MCV 97.6 07/31/2017   PLT 190 07/31/2017   Lab Results  Component Value Date   CREATININE 0.52 07/31/2017   BUN 8 07/31/2017   NA 137 07/31/2017   K 4.4 07/31/2017   CL 105 07/31/2017   CO2 26 07/31/2017    Anesthesia Physical Anesthesia Plan  ASA: II  Anesthesia Plan: General   Post-op Pain Management:    Induction: Intravenous  PONV Risk Score and Plan: 4 or greater and  Ondansetron, Dexamethasone, Propofol infusion and Treatment may vary due to age or medical condition  Airway Management Planned: Video Laryngoscope Planned and Oral ETT  Additional Equipment: None  Intra-op Plan:   Post-operative Plan: Extubation in OR  Informed Consent: I have reviewed the patients History and Physical, chart, labs and discussed the procedure including the risks, benefits and alternatives for the proposed anesthesia with the patient or authorized representative who has indicated his/her understanding and acceptance.   Dental advisory given  Plan Discussed with: CRNA  Anesthesia Plan Comments:        Anesthesia Quick Evaluation

## 2017-08-20 NOTE — Transfer of Care (Signed)
Immediate Anesthesia Transfer of Care Note  Patient: Jasmine Richard  Procedure(s) Performed: Procedure(s) with comments: Removal of retained hardware in left distal femur (Left) - 90 mins  Patient Location: PACU  Anesthesia Type:General  Level of Consciousness: awake, alert , oriented and patient cooperative  Airway & Oxygen Therapy: Patient Spontanous Breathing and Patient connected to face mask oxygen  Post-op Assessment: Report given to RN, Post -op Vital signs reviewed and stable and Patient moving all extremities  Post vital signs: Reviewed and stable  Last Vitals:  Vitals:   08/20/17 0729  BP: 121/72  Pulse: (!) 106  Resp: 18  Temp: 36.7 C  SpO2: 100%    Last Pain:  Vitals:   08/20/17 0729  TempSrc: Oral         Complications: No apparent anesthesia complications

## 2017-08-20 NOTE — Anesthesia Procedure Notes (Signed)
Procedure Name: Intubation Date/Time: 08/20/2017 10:19 AM Performed by: Jarvis Newcomer A Pre-anesthesia Checklist: Patient identified, Emergency Drugs available, Suction available, Patient being monitored and Timeout performed Patient Re-evaluated:Patient Re-evaluated prior to induction Oxygen Delivery Method: Circle system utilized Preoxygenation: Pre-oxygenation with 100% oxygen Induction Type: IV induction Ventilation: Mask ventilation without difficulty Laryngoscope Size: Glidescope and 3 Grade View: Grade I Tube type: Glide rite Tube size: 7.5 mm Number of attempts: 1 Airway Equipment and Method: Stylet and Video-laryngoscopy Placement Confirmation: ETT inserted through vocal cords under direct vision,  positive ETCO2 and breath sounds checked- equal and bilateral Secured at: 21 cm Tube secured with: Tape Dental Injury: Teeth and Oropharynx as per pre-operative assessment  Comments: Elective Glidescope per Dr Sampson Goon due to small mouth opening.

## 2017-08-21 ENCOUNTER — Encounter (HOSPITAL_COMMUNITY): Payer: Self-pay | Admitting: Orthopedic Surgery

## 2017-08-21 DIAGNOSIS — Z472 Encounter for removal of internal fixation device: Secondary | ICD-10-CM | POA: Diagnosis not present

## 2017-08-21 LAB — BASIC METABOLIC PANEL
Anion gap: 8 (ref 5–15)
BUN: 10 mg/dL (ref 6–20)
CHLORIDE: 102 mmol/L (ref 101–111)
CO2: 27 mmol/L (ref 22–32)
CREATININE: 0.43 mg/dL — AB (ref 0.44–1.00)
Calcium: 8.3 mg/dL — ABNORMAL LOW (ref 8.9–10.3)
GFR calc Af Amer: >60 mL/min (ref >60)
Glucose, Bld: 142 mg/dL — ABNORMAL HIGH (ref 65–99)
Potassium: 4 mmol/L (ref 3.5–5.1)
SODIUM: 137 mmol/L (ref 135–145)

## 2017-08-21 LAB — CBC
HCT: 28.5 % — ABNORMAL LOW (ref 36.0–46.0)
Hemoglobin: 10.4 g/dL — ABNORMAL LOW (ref 12.0–15.0)
MCH: 35.7 pg — AB (ref 26.0–34.0)
MCHC: 36.5 g/dL — AB (ref 30.0–36.0)
MCV: 97.9 fL (ref 78.0–100.0)
PLATELETS: 82 10*3/uL — AB (ref 150–400)
RBC: 2.91 MIL/uL — ABNORMAL LOW (ref 3.87–5.11)
RDW: 15.9 % — ABNORMAL HIGH (ref 11.5–15.5)
WBC: 6.3 10*3/uL (ref 4.0–10.5)

## 2017-08-21 MED ORDER — HYDROCODONE-ACETAMINOPHEN 7.5-325 MG PO TABS: 1.0000 | ORAL_TABLET | ORAL | 0 refills | Status: DC | PRN

## 2017-08-21 MED ORDER — POLYETHYLENE GLYCOL 3350 17 G PO PACK: 17.0000 g | PACK | Freq: Two times a day (BID) | ORAL | 0 refills | Status: DC

## 2017-08-21 MED ORDER — ASPIRIN 81 MG PO CHEW: 81.0000 mg | CHEWABLE_TABLET | Freq: Two times a day (BID) | ORAL | 0 refills | Status: AC

## 2017-08-21 MED ORDER — DOCUSATE SODIUM 100 MG PO CAPS: 100.0000 mg | ORAL_CAPSULE | Freq: Two times a day (BID) | ORAL | 0 refills | Status: DC

## 2017-08-21 MED ORDER — METHOCARBAMOL 500 MG PO TABS: 500.0000 mg | ORAL_TABLET | Freq: Four times a day (QID) | ORAL | 0 refills | Status: DC | PRN

## 2017-08-21 NOTE — Progress Notes (Signed)
Patient ID: Jasmine Richard, female   DOB: [birth year], 61 y.o.   MRN: [id number] Subjective: 1 Day Post-Op Procedure(s) (LRB): Removal of retained hardware in left distal femur (Left)    Patient reports pain as moderate.  Limited activity yesterday.  CIWA protocol in place  Objective:   VITALS:   Vitals:   08/21/17 0123 08/21/17 0624  BP: 122/63 115/71  Pulse: 84 78  Resp: 17 15  Temp: 99.1 F (37.3 C) 98 F (36.7 C)  SpO2: 98% 99%    Neurovascular intact Incision: dressing C/D/I - ice to left thigh  LABS  Recent Labs  08/21/17 0508  HGB 10.4*  HCT 28.5*  WBC 6.3  PLT 82*     Recent Labs  08/21/17 0508  NA 137  K 4.0  BUN 10  CREATININE 0.43*  GLUCOSE 142*    No results for input(s): LABPT, INR in the last 72 hours.   Assessment/Plan: 1 Day Post-Op Procedure(s) (LRB): Removal of retained hardware in left distal femur (Left)   Advance diet Up with therapy   Currently an observation status.  Will plan for discharge today unless she fails PT and unable to get home safely Wants Trazadone at night - should be on home med list Post-op appointment already set

## 2017-08-21 NOTE — Progress Notes (Signed)
This CM met with pt at bedside about DC plan. Pt states she has a RW and 3in1 at home. Pt states "I assume I will be doing outpatient PT when I leave" Patient was instructed to clarify this with MD. This was not indicated in MD note. Marney Doctor RN,BSN,NCM [phone]

## 2017-08-21 NOTE — Evaluation (Signed)
Physical Therapy Evaluation Patient Details Name: Jasmine Richard MRN: [id number] DOB: October 15, 1956 Today's Date: 08/21/2017   History of Present Illness  s/p hardware removal L femur  Clinical Impression  Pt admitted with above diagnosis. Pt currently with functional limitations due to the deficits listed below (see PT Problem List). * Pt will benefit from skilled PT to increase their independence and safety with mobility to allow discharge to the venue listed below.   Will see again for stair training; pt should be ready for D/C after second session      Follow Up Recommendations DC plan and follow up therapy as arranged by surgeon    Equipment Recommendations  None recommended by PT    Recommendations for Other Services       Precautions / Restrictions Precautions Precautions: Fall Restrictions Other Position/Activity Restrictions: WBAT      Mobility  Bed Mobility               General bed mobility comments: pt in recliner  Transfers Overall transfer level: Needs assistance Equipment used: Rolling walker (2 wheeled) Transfers: Sit to/from Stand Sit to Stand: Min guard;Min assist         General transfer comment: close guard for safety, Pt with minor LOB during sit<>stand, able to recover with MinA; stood from toilet with supervision but required heavy min assist to sit  Ambulation/Gait                Stairs            Wheelchair Mobility    Modified Rankin (Stroke Patients Only)       Balance Overall balance assessment: Needs assistance Sitting-balance support: Feet supported Sitting balance-Leahy Scale: Fair Sitting balance - Comments: static sitting EOB    Standing balance support: Bilateral upper extremity supported Standing balance-Leahy Scale: Poor Standing balance comment: reliant on UEs during mobility; requires close guard for safety during LB clothing manipulation                             Pertinent  Vitals/Pain Pain Assessment: Faces Faces Pain Scale: Hurts little more Pain Descriptors / Indicators: Aching;Sore;Grimacing Pain Intervention(s): Monitored during session;Repositioned;Ice applied;RN gave pain meds during session    Home Living Family/patient expects to be discharged to:: Private residence Living Arrangements: Alone Available Help at Discharge: Family;Friend(s) Type of Home: Other(Comment) (townhouse ) Home Access: Stairs to enter   [employer] of Steps: 2 Home Layout: One level;Able to live on main level with bedroom/bathroom Home Equipment: Dan Humphreys - 2 wheels;Tub bench;Bedside commode      Prior Function Level of Independence: Independent               Hand Dominance        Extremity/Trunk Assessment   Upper Extremity Assessment Upper Extremity Assessment: Defer to OT evaluation;Overall WFL for tasks assessed    Lower Extremity Assessment Lower Extremity Assessment: LLE deficits/detail LLE Deficits / Details: knee flexion limited to ~  10* to 65*, grossly 3/5       Communication   Communication: No difficulties  Cognition Arousal/Alertness: Awake/alert Behavior During Therapy: WFL for tasks assessed/performed;Impulsive Overall Cognitive Status: Impaired/Different from baseline Area of Impairment: Safety/judgement;Problem solving                         Safety/Judgement: Decreased awareness of safety   Problem Solving: Requires verbal cues;Requires tactile cues General Comments: Pt requires  verbal safety cues throughout session, during LB dressing Pt stood and began walking away from edge of chair while pants were still down around legs (prior to pulling over hips); when donning overhead nightgown Pt donned nightgown, Pt initially put head and UE through same opening, required assist to correct      General Comments      Exercises     Assessment/Plan    PT Assessment Patient needs continued PT services  PT Problem  List Decreased activity tolerance;Decreased range of motion;Decreased strength;Decreased balance;Pain;Decreased knowledge of use of DME       PT Treatment Interventions DME instruction;Gait training;Functional mobility training;Therapeutic activities;Therapeutic exercise;Patient/family education;Stair training    PT Goals (Current goals can be found in the Care Plan section)  Acute Rehab PT Goals Patient Stated Goal: return home  PT Goal Formulation: With patient Time For Goal Achievement: 08/28/17 Potential to Achieve Goals: Good    Frequency Min 6X/week   Barriers to discharge        Co-evaluation               AM-PAC PT "6 Clicks" Daily Activity  Outcome Measure Difficulty turning over in bed (including adjusting bedclothes, sheets and blankets)?: A Little Difficulty moving from lying on back to sitting on the side of the bed? : A Little Difficulty sitting down on and standing up from a chair with arms (e.g., wheelchair, bedside commode, etc,.)?: Unable Help needed moving to and from a bed to chair (including a wheelchair)?: A Little Help needed walking in hospital room?: A Little Help needed climbing 3-5 steps with a railing? : A Little 6 Click Score: 16    End of Session Equipment Utilized During Treatment: Gait belt Activity Tolerance: Patient tolerated treatment well Patient left: with call bell/phone within reach;in chair;with chair alarm set Nurse Communication: Mobility status PT Visit Diagnosis: Difficulty in walking, not elsewhere classified (R26.2);Pain Pain - Right/Left: Left Pain - part of body: Knee    Time: 2263-3354 PT Time Calculation (min) (ACUTE ONLY): 28 min   Charges:   PT Evaluation $PT Eval Low Complexity: 1 Low PT Treatments $Gait Training: 8-22 mins   PT G Codes:   PT G-Codes **NOT FOR INPATIENT CLASS** Functional Assessment Tool Used: AM-PAC 6 Clicks Basic Mobility;Clinical judgement Functional Limitation: Mobility: Walking and  moving around Mobility: Walking and Moving Around Current Status (T6256): At least 20 percent but less than 40 percent impaired, limited or restricted Mobility: Walking and Moving Around Goal Status [phone]): At least 1 percent but less than 20 percent impaired, limited or restricted      [facility] 08/21/2017, 11:50 AM

## 2017-08-21 NOTE — Evaluation (Addendum)
Occupational Therapy Evaluation Patient Details Name: Jasmine Richard MRN: [id number] DOB: Jun 27, 1956 Today's Date: 08/21/2017    History of Present Illness s/p hardware removal L femur   Clinical Impression   This 61 y/o F presents with the above. At baseline Pt is independent with ADLs and functional mobility. Pt currently requires MinA for functional mobility and UB/LB ADLs. Pt demonstrates decreased safety awareness during ADL completion/functional mobility, requiring verbal cues and education throughout session to ensure safe ADL completion and safe RW use. Pt lives alone, reports will have 24 hr assist initially after return home for assistance PRN. Pt will benefit from continued acute OT services to maximize Pt's safety and independence with ADLs and functional mobility prior to return home.     Follow Up Recommendations  DC plan and follow up therapy as arranged by surgeon;Supervision/Assistance - 24 hour    Equipment Recommendations  None recommended by OT           Precautions / Restrictions Precautions Precautions: Fall Restrictions Other Position/Activity Restrictions: WBAT      Mobility Bed Mobility               General bed mobility comments: Pt sitting EOB with RN upon entering room   Transfers Overall transfer level: Needs assistance Equipment used: Rolling walker (2 wheeled) Transfers: Sit to/from Stand Sit to Stand: Min guard;Min assist         General transfer comment: close guard for safety, Pt with minor LOB during sit<>stand, able to recover with MinA     Balance Overall balance assessment: Needs assistance Sitting-balance support: Feet supported Sitting balance-Leahy Scale: Fair Sitting balance - Comments: static sitting EOB    Standing balance support: Bilateral upper extremity supported Standing balance-Leahy Scale: Poor Standing balance comment: reliant on UEs during mobility; requires close guard for safety during LB dressing                             ADL either performed or assessed with clinical judgement   ADL Overall ADL's : Needs assistance/impaired Eating/Feeding: Set up;Sitting   Grooming: Set up;Sitting   Upper Body Bathing: Min guard;Sitting   Lower Body Bathing: Minimal assistance;Sit to/from stand   Upper Body Dressing : Minimal assistance;Sitting Upper Body Dressing Details (indicate cue type and reason): assist for nightgown management when donning overhead Lower Body Dressing: Minimal assistance;Sit to/from stand Lower Body Dressing Details (indicate cue type and reason): Pt requires assist for safety; able to doff/don socks and dons underwear, MinA while standing to advance over hips  Toilet Transfer: Maximal assistance;Ambulation;BSC;RW Toilet Transfer Details (indicate cue type and reason): BSC over toilet; simulated via room level functional mobility around bedside to transfer to chair  Toileting- Clothing Manipulation and Hygiene: Min guard;Sit to/from Nurse, children's Details (indicate cue type and reason): verbally reviewed tub transfer using tub bench  Functional mobility during ADLs: Minimal assistance;Rolling walker;Cueing for safety                           Pertinent Vitals/Pain Pain Assessment: Faces Faces Pain Scale: Hurts little more Pain Descriptors / Indicators: Aching;Sore;Grimacing Pain Intervention(s): Monitored during session;Repositioned;Ice applied;RN gave pain meds during session          Extremity/Trunk Assessment Upper Extremity Assessment Upper Extremity Assessment: Overall WFL for tasks assessed   Lower Extremity Assessment Lower Extremity Assessment: Defer to PT evaluation  Communication Communication Communication: No difficulties   Cognition Arousal/Alertness: Awake/alert Behavior During Therapy: WFL for tasks assessed/performed;Impulsive Overall Cognitive Status: Impaired/Different from baseline Area of  Impairment: Safety/judgement;Problem solving                         Safety/Judgement: Decreased awareness of safety   Problem Solving: Requires verbal cues;Requires tactile cues General Comments: Pt requires verbal safety cues throughout session, during LB dressing Pt stood and began walking away from edge of chair while pants were still down around legs (prior to pulling over hips); when donning overhead nightgown Pt donned nightgown, Pt initially put head and UE through same opening, required assist to correct   General Comments                  Home Living Family/patient expects to be discharged to:: Private residence Living Arrangements: Alone Available Help at Discharge: Family;Friend(s) Type of Home: Other(Comment) (townhouse ) Home Access: Stairs to enter [employer] of Steps: 2   Home Layout: One level;Able to live on main level with bedroom/bathroom     Bathroom Shower/Tub: Chief Strategy Officer: Standard     Home Equipment: Environmental consultant - 2 wheels;Tub bench;Bedside commode          Prior Functioning/Environment Level of Independence: Independent                 OT Problem List: Impaired balance (sitting and/or standing);Decreased activity tolerance;Decreased safety awareness;Decreased knowledge of precautions;Decreased knowledge of use of DME or AE;Pain      OT Treatment/Interventions: Self-care/ADL training;DME and/or AE instruction;Therapeutic activities;Balance training;Therapeutic exercise;Patient/family education    OT Goals(Current goals can be found in the care plan section) Acute Rehab OT Goals Patient Stated Goal: return home  OT Goal Formulation: With patient Time For Goal Achievement: 08/28/17 Potential to Achieve Goals: Good ADL Goals Pt Will Perform Grooming: with supervision;standing Pt Will Perform Upper Body Bathing: with supervision;sitting Pt Will Perform Lower Body Bathing: with supervision;sit to/from  stand Pt Will Perform Lower Body Dressing: with supervision;sit to/from stand Pt Will Transfer to Toilet: with supervision;ambulating;bedside commode (BSC over toilet ) Pt Will Perform Toileting - Clothing Manipulation and hygiene: with supervision;sit to/from stand Pt Will Perform Tub/Shower Transfer: Tub transfer;with min guard assist;tub bench;rolling walker  OT Frequency: Min 2X/week                             AM-PAC PT "6 Clicks" Daily Activity     Outcome Measure Help from another person eating meals?: None Help from another person taking care of personal grooming?: A Little Help from another person toileting, which includes using toliet, bedpan, or urinal?: A Little Help from another person bathing (including washing, rinsing, drying)?: A Little Help from another person to put on and taking off regular upper body clothing?: A Little Help from another person to put on and taking off regular lower body clothing?: A Little 6 Click Score: 19   End of Session Equipment Utilized During Treatment: Gait belt;Rolling walker Nurse Communication: Mobility status  Activity Tolerance: Patient tolerated treatment well Patient left: in chair;with call bell/phone within reach;with chair alarm set  OT Visit Diagnosis: Unsteadiness on feet (R26.81);Pain Pain - Right/Left: Left Pain - part of body: Leg                Time: 1694-5038 OT Time Calculation (min): 46 min Charges:  OT General Charges $  OT Visit: 1 Visit OT Evaluation $OT Eval Low Complexity: 1 Low OT Treatments $Self Care/Home Management : 23-37 mins G-Codes: OT G-codes **NOT FOR INPATIENT CLASS** Functional Assessment Tool Used: AM-PAC 6 Clicks Daily Activity;Clinical judgement Functional Limitation: Self care Self Care Current Status (B1517): At least 20 percent but less than 40 percent impaired, limited or restricted Self Care Goal Status (O1607): At least 1 percent but less than 20 percent impaired, limited or  restricted   Marcy Siren, OT Pager [phone] 08/21/2017   Orlando Penner 08/21/2017, 11:38 AM

## 2017-08-21 NOTE — Progress Notes (Signed)
Physical Therapy Treatment Patient Details Name: Jasmine Richard MRN: [id number] DOB: 11-07-1956 Today's Date: 08/21/2017    History of Present Illness s/p hardware removal L femur hx ETOH    PT Comments    Pt  Doing well, mildly impulsive requiring cues for safety; would benefit from HHPT but D/C plan per MD  Follow Up Recommendations  DC plan and follow up therapy as arranged by surgeon     Equipment Recommendations  None recommended by PT    Recommendations for Other Services       Precautions / Restrictions Precautions Precautions: Fall Restrictions Other Position/Activity Restrictions: WBAT    Mobility  Bed Mobility               General bed mobility comments: pt in recliner  Transfers Overall transfer level: Needs assistance Equipment used: Rolling walker (2 wheeled) Transfers: Sit to/from Stand Sit to Stand: Min guard;Supervision         General transfer comment: cues for hand placement and safety  Ambulation/Gait Ambulation/Gait assistance: Min guard Ambulation Distance (Feet): 40 Feet Assistive device: Rolling walker (2 wheeled) Gait Pattern/deviations: Step-to pattern;Step-through pattern;Antalgic     General Gait Details: cues for safety and RW position   Stairs Stairs: Yes   Stair Management: Two rails;Forwards Number of Stairs: 5 General stair comments: cues for sequence  Wheelchair Mobility    Modified Rankin (Stroke Patients Only)       Balance Overall balance assessment: Needs assistance Sitting-balance support: Feet supported Sitting balance-Leahy Scale: Fair Sitting balance - Comments: static sitting EOB    Standing balance support: Bilateral upper extremity supported Standing balance-Leahy Scale: Poor Standing balance comment: reliant on UEs during mobility; requires close guard for safety during LB clothing manipulation                            Cognition Arousal/Alertness: Awake/alert Behavior  During Therapy: [facility] for tasks assessed/performed;Impulsive Overall Cognitive Status: Impaired/Different from baseline Area of Impairment: Safety/judgement;Problem solving                         Safety/Judgement: Decreased awareness of safety   Problem Solving: Requires verbal cues;Requires tactile cues General Comments: Pt requires verbal safety cues throughout session, during LB dressing Pt stood and began walking away from edge of chair while pants were still down around legs (prior to pulling over hips); when donning overhead nightgown Pt donned nightgown, Pt initially put head and UE through same opening, required assist to correct      Exercises Total Joint Exercises Ankle Circles/Pumps: AROM;Both;10 reps Quad Sets: AROM;Strengthening;Both;10 reps Heel Slides: AAROM;Left;5 reps    General Comments        Pertinent Vitals/Pain Pain Assessment: 0-10 Pain Score: 2  Faces Pain Scale: Hurts little more Pain Location: left knee Pain Descriptors / Indicators: Aching;Sore;Grimacing Pain Intervention(s): Limited activity within patient's tolerance;Monitored during session    Home Living Family/patient expects to be discharged to:: Private residence Living Arrangements: Alone Available Help at Discharge: Family;Friend(s) Type of Home: Other(Comment) (townhouse ) Home Access: Stairs to enter   Home Layout: One level;Able to live on main level with bedroom/bathroom Home Equipment: Dan Humphreys - 2 wheels;Tub bench;Bedside commode      Prior Function Level of Independence: Independent          PT Goals (current goals can now be found in the care plan section) Acute Rehab PT Goals Patient Stated Goal: return  home  PT Goal Formulation: With patient Time For Goal Achievement: 08/28/17 Potential to Achieve Goals: Good    Frequency    Min 6X/week      PT Plan Current plan remains appropriate    Co-evaluation              AM-PAC PT "6 Clicks" Daily  Activity  Outcome Measure  Difficulty turning over in bed (including adjusting bedclothes, sheets and blankets)?: A Little Difficulty moving from lying on back to sitting on the side of the bed? : A Little Difficulty sitting down on and standing up from a chair with arms (e.g., wheelchair, bedside commode, etc,.)?: Unable Help needed moving to and from a bed to chair (including a wheelchair)?: A Little Help needed walking in hospital room?: A Little Help needed climbing 3-5 steps with a railing? : A Little 6 Click Score: 16    End of Session Equipment Utilized During Treatment: Gait belt Activity Tolerance: Patient tolerated treatment well Patient left: with call bell/phone within reach;in chair;with chair alarm set Nurse Communication: Mobility status PT Visit Diagnosis: Difficulty in walking, not elsewhere classified (R26.2);Pain Pain - Right/Left: Left Pain - part of body: Knee     Time: 2878-6767 PT Time Calculation (min) (ACUTE ONLY): 12 min  Charges:  $Gait Training: 8-22 mins                    G Codes:  Functional Assessment Tool Used: AM-PAC 6 Clicks Basic Mobility;Clinical judgement Functional Limitation: Mobility: Walking and moving around Mobility: Walking and Moving Around Current Status (M0947): At least 20 percent but less than 40 percent impaired, limited or restricted Mobility: Walking and Moving Around Goal Status [phone]): At least 1 percent but less than 20 percent impaired, limited or restricted    Drucilla Chalet, PT Pager: [phone] 08/21/2017    Drucilla Chalet 08/21/2017, 2:25 PM

## 2017-08-21 NOTE — Op Note (Signed)
NAMEJAMILET, Jasmine Richard              ACCOUNT NO.:  [id number]  MEDICAL RECORD NO.:  [id number]  LOCATION:                                 FACILITY:  PHYSICIAN:  [name], [name]       DATE OF BIRTH:  DATE OF PROCEDURE:  08/20/2017 DATE OF DISCHARGE:                              OPERATIVE REPORT   PREOPERATIVE DIAGNOSIS:  Retained orthopedic hardware in the setting of advanced knee osteoarthritis, left.  POSTOPERATIVE DIAGNOSIS:  Retained orthopedic hardware in the setting of advanced knee osteoarthritis, left.  PROCEDURE:  Removal of left distal femoral plate.  This included the plate and 7 retained screws.  Please note that I had removed a previous screw on outpatient basis.  SURGEON:  [name], [name]  ASSISTANT:  Lanney Gins, PA-C.  ANESTHESIA:  General.  SPECIMENS:  None.  COMPLICATION:  None.  BLOOD LOSS:  Less than 50 mL.  TOURNIQUET:  Tourniquet was utilized during the case for 32 minutes at 250 mmHg.  INDICATIONS FOR PROCEDURE:  Jasmine Richard is a 61 year old female who has been a patient of mine for a while.  She unfortunately had a fall December of 2016, requiring an open reduction and internal fixation.  She has had in the past, patellofemoral arthritis, but this has been exacerbated with the postoperative course after distal femur fracture.  At this point, she would like to proceed with total knee arthroplasty. We discussed removing her hardware in staged fashion and make sure there was no evidence of any periprosthetic complications in the perioperative time as well as makes her incision to heal prior to knee replacement. Risks and benefits of the procedure were discussed including the potential for fracture through screw sites.  Consent was obtained for benefit of pain relief, minimal risks for infection and DVT, but she will be protected with antibiotics and aspirin postoperatively.  PROCEDURE IN DETAIL:  The patient was brought to the  operative theater. Once adequate anesthesia, preoperative antibiotics, Ancef administered, she was positioned in supine with a bump underneath the left hip.  The proximal thigh tourniquet was able to be high enough from the proximal- most screw incision site.  The left lower extremity was prepped and draped in sterile fashion.  Time-out was performed identifying the patient, planned procedure and extremity.  The leg was exsanguinated, tourniquet was elevated to 250 mmHg.  We identified her old incisions.  I initially demarcated them on the skin.  I started distally, excised her old scar.  The plate was identified and the three remaining screws in the distal aspect of the femur were removed easily.  The previous fourth screw had been removed on outpatient basis as noted.  Instead of using a separate stab incision, the incision distally was long enough that I just extended it slightly to expose the distal metadiaphyseal screws, these were identified and removed.  There was a separate incision proximally, which I used, soft tissue dissection to the iliotibial band.  I was then able to get down to bone, identified the plate and the screws.  These screws were removed.  Once all the screws were removed without breakage or complication, the plate was elevated  using Cobb elevator distally and then removed without difficulty.  Following debridement of soft tissue as palpable, we irrigated the wound with normal saline solution.  Proximally, the iliotibial band was reapproximated with interrupted #1 Vicryl sutures and then 2-0 Vicryl and subcuticular stitch.  Distally, the wound was closed in layers with the iliotibial band reapproximated using #1 Vicryl and 0 Stratafix suture.  The remainder of the wound distally was closed again with 2-0 Vicryl and a running 3-0 Monocryl. Wounds were then cleaned, dried and dressed sterilely with surgical glue and Aquacel dressing.  Tourniquet had been  used for 32 minutes.  Once the legs were dressed, she was placed in an Ace wrap, brought to the recovery room in stable condition.  She will be admitted overnight for observation.  Check her hemoglobin in the morning due to the potential for bleeding from screw holes as well as hardware removal and dissection.  We will also monitor her pain control.  Anticipate that she will be weightbearing as tolerated with a walker.  We will see her back in the office into certain timing for knee replacement upon adequate healing.     Madlyn Frankel Charlann Boxer, [name]   ______________________________ [name], [name]    MDO/MEDQ  D:  08/20/2017  T:  08/21/2017  Job:  [id number]

## 2017-08-22 NOTE — Discharge Summary (Signed)
Physician Discharge Summary  Patient ID: Jasmine Richard MRN: [id number] DOB/AGE: 04-09-1956 61 y.o.  Admit date: 08/20/2017 Discharge date: 08/21/2017   Procedures:  Procedure(s) (LRB): Removal of retained hardware in left distal femur (Left)  Attending Physician:  Dr. Durene Romans   Admission Diagnoses:   Left knee OA and pain with retained orthopaedic hardware  Discharge Diagnoses:  Principal Problem:   Retained orthopedic hardware  Past Medical History:  Diagnosis Date  . ADD (attention deficit disorder)   . Anxiety   . Depression   . Diverticulosis   . GERD (gastroesophageal reflux disease)   . H/O ETOH abuse    recovered  . Headache   . Heart murmur   . IDA (iron deficiency anemia)   . Insomnia   . Osteoarthritis   . Psoriatic arthritis (HCC)    Dr. Zenovia Jordan    HPI:    Pt is a 61 y.o. female complaining of left leg/knee pain for ~1.5 years. Pain had continually increased since the beginning. X-rays in the clinic show end-stage arthritic changes of the left knee as well as retained orthopaedic hardware. Pt has tried various conservative treatments which have failed to alleviate their symptoms, including NSAIDs, cortisone injections, assistance devices and activity modification. Various options are discussed with the patient. Risks, benefits and expectations were discussed with the patient. Patient understand the risks, benefits and expectations and wishes to proceed with surgery.   PCP: Merlene Laughter, MD   Discharged Condition: good  Hospital Course:  Patient underwent the above stated procedure on 08/20/2017. Patient tolerated the procedure well and brought to the recovery room in good condition and subsequently to the floor.  POD #1 BP: 115/71 ; Pulse: 78 ; Temp: 98 F (36.7 C) ; Resp: 15 Patient reports pain as moderate.  Limited activity yesterday.  CIWA protocol in place. Neurovascular intact and incision: dressing C/D/I - ice to left thigh.  LABS    Basename    HGB     10.4  HCT     28.5    Discharge Exam: General appearance: alert, cooperative and no distress Extremities: Homans sign is negative, no sign of DVT, no edema, redness or tenderness in the calves or thighs and no ulcers, gangrene or trophic changes  Disposition: Home with follow up in 2 weeks   Follow-up Information    Durene Romans, MD. Schedule an appointment as soon as possible for a visit in 2 week(s).   Specialty:  Orthopedic Surgery Contact information: [address] [place] [address] [phone]           Discharge Instructions    Call MD / Call 911    Complete by:  As directed    If you experience chest pain or shortness of breath, CALL 911 and be transported to the hospital emergency room.  If you develope a fever above 101 F, pus (white drainage) or increased drainage or redness at the wound, or calf pain, call your surgeon's office.   Change dressing    Complete by:  As directed    Maintain surgical dressing until follow up in the clinic. If the edges start to pull up, may reinforce with tape. If the dressing is no longer working, may remove and cover with gauze and tape, but must keep the area dry and clean.  Call with any questions or concerns.   Constipation Prevention    Complete by:  As directed    Drink plenty of fluids.  Prune juice may be helpful.  You may use a stool softener, such as Colace (over the counter) 100 mg twice a day.  Use MiraLax (over the counter) for constipation as needed.   Diet - low sodium heart healthy    Complete by:  As directed    Discharge instructions    Complete by:  As directed    Maintain surgical dressing until follow up in the clinic. If the edges start to pull up, may reinforce with tape. If the dressing is no longer working, may remove and cover with gauze and tape, but must keep the area dry and clean.  Follow up in 2 weeks at [facility]. Call with any questions or  concerns.   Increase activity slowly as tolerated    Complete by:  As directed    Weight bearing as tolerated with assist device (walker, cane, etc) as directed, use it as long as suggested by your surgeon or therapist, typically at least 4-6 weeks.   TED hose    Complete by:  As directed    Use stockings (TED hose) for 2 weeks on both leg(s).  You may remove them at night for sleeping.      Allergies as of 08/21/2017   No Known Allergies     Medication List    STOP taking these medications   HUMIRA 40 MG/0.8ML Pskt Generic drug:  Adalimumab   HYDROcodone-acetaminophen 5-325 MG tablet Commonly known as:  NORCO/VICODIN Replaced by:  HYDROcodone-acetaminophen 7.5-325 MG tablet   meloxicam 15 MG tablet Commonly known as:  MOBIC     TAKE these medications   aspirin 81 MG chewable tablet Chew 1 tablet (81 mg total) by mouth 2 (two) times daily. Take for 4 weeks.   Biotin 5000 MCG Caps Take 5,000 mcg by mouth daily.   CALCIUM 1200 PO Take 1 tablet by mouth 2 (two) times daily.   cyanocobalamin 1000 MCG/ML injection Commonly known as:  (VITAMIN B-12) Inject 1 mL (1,000 mcg total) into the muscle every 30 (thirty) days.   docusate sodium 100 MG capsule Commonly known as:  COLACE Take 1 capsule (100 mg total) by mouth 2 (two) times daily.   escitalopram 20 MG tablet Commonly known as:  LEXAPRO Take 1 tablet (20 mg total) by mouth daily.   ferrous sulfate 325 (65 FE) MG tablet Take 325 mg by mouth daily with breakfast.   HYDROcodone-acetaminophen 7.5-325 MG tablet Commonly known as:  NORCO Take 1-2 tablets by mouth every 4 (four) hours as needed for moderate pain. Replaces:  HYDROcodone-acetaminophen 5-325 MG tablet   loperamide 2 MG capsule Commonly known as:  IMODIUM Take 2-4 mg by mouth See admin instructions. Take 4mg  at onset of diarrhea then 2mg  every 2 hours as needed for diarrhea. Do not exceed 16mg  in 24hrs.   MAGNESIUM-POTASSIUM PO Take 1 tablet by mouth  2 (two) times daily.   methocarbamol 500 MG tablet Commonly known as:  ROBAXIN Take 1 tablet (500 mg total) by mouth every 6 (six) hours as needed for muscle spasms.   multivitamin with minerals tablet Take 0.5 tablets by mouth 2 (two) times daily.   naltrexone 50 MG tablet Commonly known as:  DEPADE Take 50 mg by mouth at bedtime.   omeprazole 20 MG capsule Commonly known as:  PRILOSEC Take 20 mg by mouth 2 (two) times daily before a meal.   oxybutynin 5 MG 24 hr tablet Commonly known as:  DITROPAN-XL Take 5 mg by mouth daily.  polyethylene glycol packet Commonly known as:  MIRALAX / GLYCOLAX Take 17 g by mouth 2 (two) times daily.   PROBIOTIC DAILY PO Take 1 capsule by mouth daily.   PSEUDOEPHEDRINE-GUAIFENESIN CR PO Take 1 tablet by mouth daily.   SUMAtriptan 100 MG tablet Commonly known as:  IMITREX May repeat in 2 hours if headache persists or recurs. What changed:  how much to take  how to take this  when to take this  reasons to take this  additional instructions   traZODone 100 MG tablet Commonly known as:  DESYREL Take 100 mg by mouth at bedtime.   valACYclovir 500 MG tablet Commonly known as:  VALTREX Take 500 mg by mouth See admin instructions. Take 4 tablets at first sign of cold sores and 4 tablets 12 hours later.   vitamin C 500 MG tablet Commonly known as:  ASCORBIC ACID Take 500 mg by mouth 2 (two) times daily.            Discharge Care Instructions        Start     Ordered   08/21/17 0000  docusate sodium (COLACE) 100 MG capsule  2 times daily    Question:  Supervising Provider  Answer:  Durene Romans   08/21/17 0946   08/21/17 0000  methocarbamol (ROBAXIN) 500 MG tablet  Every 6 hours PRN    Question:  Supervising Provider  Answer:  Durene Romans   08/21/17 0946   08/21/17 0000  polyethylene glycol (MIRALAX / Ethelene Hal) packet  2 times daily    Question:  Supervising Provider  Answer:  Durene Romans   08/21/17 0946    08/21/17 0000  aspirin 81 MG chewable tablet  2 times daily    Question:  Supervising Provider  Answer:  Durene Romans   08/21/17 0946   08/21/17 0000  HYDROcodone-acetaminophen (NORCO) 7.5-325 MG tablet  Every 4 hours PRN    Comments:  Post-op Rx  Question:  Supervising Provider  AnswerDurene Romans   08/21/17 0946   08/21/17 0000  Call MD / Call 911    Comments:  If you experience chest pain or shortness of breath, CALL 911 and be transported to the hospital emergency room.  If you develope a fever above 101 F, pus (white drainage) or increased drainage or redness at the wound, or calf pain, call your surgeon's office.   08/21/17 0946   08/21/17 0000  Discharge instructions    Comments:  Maintain surgical dressing until follow up in the clinic. If the edges start to pull up, may reinforce with tape. If the dressing is no longer working, may remove and cover with gauze and tape, but must keep the area dry and clean.  Follow up in 2 weeks at [facility]. Call with any questions or concerns.   08/21/17 0946   08/21/17 0000  Diet - low sodium heart healthy     08/21/17 0946   08/21/17 0000  Constipation Prevention    Comments:  Drink plenty of fluids.  Prune juice may be helpful.  You may use a stool softener, such as Colace (over the counter) 100 mg twice a day.  Use MiraLax (over the counter) for constipation as needed.   08/21/17 0946   08/21/17 0000  Increase activity slowly as tolerated    Comments:  Weight bearing as tolerated with assist device (walker, cane, etc) as directed, use it as long as suggested by your surgeon or therapist, typically at least 4-6  weeks.   08/21/17 0946   08/21/17 0000  TED hose    Comments:  Use stockings (TED hose) for 2 weeks on both leg(s).  You may remove them at night for sleeping.   08/21/17 0946   08/21/17 0000  Change dressing    Comments:  Maintain surgical dressing until follow up in the clinic. If the edges start to pull up, may  reinforce with tape. If the dressing is no longer working, may remove and cover with gauze and tape, but must keep the area dry and clean.  Call with any questions or concerns.   08/21/17 5056       Signed: Anastasio Auerbach. Asiah Befort   PA-C  08/22/2017, 9:09 AM

## 2017-08-23 ENCOUNTER — Ambulatory Visit: Payer: Self-pay | Admitting: Psychiatry

## 2017-08-30 ENCOUNTER — Ambulatory Visit: Admitting: Psychiatry

## 2017-09-03 DIAGNOSIS — Z472 Encounter for removal of internal fixation device: Secondary | ICD-10-CM

## 2017-09-11 ENCOUNTER — Ambulatory Visit (INDEPENDENT_AMBULATORY_CARE_PROVIDER_SITE_OTHER): Admitting: Psychology

## 2017-09-11 DIAGNOSIS — F1020 Alcohol dependence, uncomplicated: Secondary | ICD-10-CM

## 2017-09-13 ENCOUNTER — Ambulatory Visit: Admitting: Psychiatry

## 2017-09-13 ENCOUNTER — Other Ambulatory Visit (HOSPITAL_COMMUNITY): Attending: Psychiatry | Admitting: Psychology

## 2017-09-13 DIAGNOSIS — K219 Gastro-esophageal reflux disease without esophagitis: Secondary | ICD-10-CM | POA: Insufficient documentation

## 2017-09-13 DIAGNOSIS — Z811 Family history of alcohol abuse and dependence: Secondary | ICD-10-CM | POA: Insufficient documentation

## 2017-09-13 DIAGNOSIS — F4323 Adjustment disorder with mixed anxiety and depressed mood: Secondary | ICD-10-CM | POA: Insufficient documentation

## 2017-09-13 DIAGNOSIS — G4700 Insomnia, unspecified: Secondary | ICD-10-CM | POA: Insufficient documentation

## 2017-09-13 DIAGNOSIS — Z9884 Bariatric surgery status: Secondary | ICD-10-CM | POA: Insufficient documentation

## 2017-09-13 DIAGNOSIS — D509 Iron deficiency anemia, unspecified: Secondary | ICD-10-CM | POA: Insufficient documentation

## 2017-09-13 DIAGNOSIS — F4310 Post-traumatic stress disorder, unspecified: Secondary | ICD-10-CM | POA: Diagnosis not present

## 2017-09-13 DIAGNOSIS — M0579 Rheumatoid arthritis with rheumatoid factor of multiple sites without organ or systems involvement: Secondary | ICD-10-CM | POA: Diagnosis not present

## 2017-09-13 DIAGNOSIS — Z7982 Long term (current) use of aspirin: Secondary | ICD-10-CM | POA: Insufficient documentation

## 2017-09-13 DIAGNOSIS — M1990 Unspecified osteoarthritis, unspecified site: Secondary | ICD-10-CM | POA: Insufficient documentation

## 2017-09-13 DIAGNOSIS — L4050 Arthropathic psoriasis, unspecified: Secondary | ICD-10-CM | POA: Insufficient documentation

## 2017-09-13 DIAGNOSIS — F900 Attention-deficit hyperactivity disorder, predominantly inattentive type: Secondary | ICD-10-CM | POA: Diagnosis not present

## 2017-09-13 DIAGNOSIS — Z8049 Family history of malignant neoplasm of other genital organs: Secondary | ICD-10-CM | POA: Diagnosis not present

## 2017-09-13 DIAGNOSIS — G43709 Chronic migraine without aura, not intractable, without status migrainosus: Secondary | ICD-10-CM | POA: Diagnosis not present

## 2017-09-13 DIAGNOSIS — I350 Nonrheumatic aortic (valve) stenosis: Secondary | ICD-10-CM | POA: Insufficient documentation

## 2017-09-13 DIAGNOSIS — F1020 Alcohol dependence, uncomplicated: Secondary | ICD-10-CM | POA: Diagnosis not present

## 2017-09-13 DIAGNOSIS — G894 Chronic pain syndrome: Secondary | ICD-10-CM | POA: Diagnosis not present

## 2017-09-13 DIAGNOSIS — Z79899 Other long term (current) drug therapy: Secondary | ICD-10-CM | POA: Insufficient documentation

## 2017-09-13 DIAGNOSIS — Z8249 Family history of ischemic heart disease and other diseases of the circulatory system: Secondary | ICD-10-CM | POA: Diagnosis not present

## 2017-09-13 DIAGNOSIS — F411 Generalized anxiety disorder: Secondary | ICD-10-CM | POA: Insufficient documentation

## 2017-09-16 ENCOUNTER — Encounter (HOSPITAL_COMMUNITY): Payer: Self-pay | Admitting: Psychology
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
## 2017-09-16 NOTE — Progress Notes (Signed)
The patient is a 61 yo widowed, white, female seeking to enter the CD-IOP. She lives alone in [place]. The patient was referred to an intensive outpatient program upon discharge from 29 days of residential treatment at the [facility] in [place], [place]. The treatment in [place] was not her first treatment experience, but the most recent since 2016. The patient successfully completed the CD-IOP here at [facility] in the summer of 2016. She had completed an alcohol detox at a hospital in [place] and her PCP had referred her to this program. Later that fall, the patient returned to use and entered [facility] in October. She was still at [facility] on December 23 when she fell from a chair she was standing on to reach something in the kitchen. The fall caused serious injuries including breaking her left femur in three places and badly damaging her knee. She had surgery and spent the next three months at [facility], a rehab nursing facility. The patient remained sober until May of 2018. She explained she had been 'uninvited' to an annual beach trip with a group of ladies she had gone with for 15+ years. The group was made up of ladies from her church. This rejection was incredibly painful and the patient admitted, "I drank that night". This relapse precipitated her eventual entry into [facility] in early July. The patient reported this treatment experience was very intense and extremely informative. She returned to her home and met with this counselor in late August. She reported her family wanted her to have accountability, which the CD-IOP would represent. Despite the recommendation that she seek counseling with [facility], LPC, LCAS, the patient did not follow through on this recommendation. She contacted this counselor again in mid-October stating she was determined to enter this program. Upon meeting with her on Oct. 16, the patient admitted she had relapsed earlier in the month.  The patient explained that she  had been 'uninvited' to attend her nephew's wedding scheduled for October 6. Her brother was concerned that she would get drunk and ruin his son's wedding. His concerns included that the wedding was being held in the same church where she had married her husband 40+ years ago. The patient reported she drank a 'box of wine' from the 6 through the 7. Her sobriety date is October 8. She presented on the 16 using a cane. She had had surgery on September 24 to have the 'hardware' removed from her leg and knee in order to prepare for a knee replacement on December 4. The patient did not drink for most of her life and was a minimal drinker in later years. Her husband collapsed with a brain aneurysm while on a business trip in [place]. The patient never regained consciousness and after five days at his bedside, the family ordered that all life support be removed. He died on 2012-07-30. The patient's foundation collapsed and she admitted she gradually began drinking to self-medicate her pain and grief. As a result of these numerous episodes, the patient has become distanced from her three married children and their families. She also feels that she has been judged and ostracized by her "Darrick Meigs" friends and community. Despite attending AA for a good two years, the patient admitted she has never gotten beyond Step 3. It does not appear she has developed particularly close or trusting relationships with her sponsors. It is unclear whether she is willing to become vulnerable and address these inner demons that keep her 'going  back out again'. The patient is the oldest of two children and her younger brother, by two years, also lives in [place]. The patient's mother is deceased, but her father is 5 yo and lives at [facility], an affluent retirement community in [place]. The patient denies any alcohol abuse in her immediate family, but her brother is obese and she underwent a gastric bypass in 2002. A paternal  uncle was morbidly obese at over 600 lbs and she reported her maternal grandfather's brother was an alcoholic. In addition to her diagnosis of Alcohol Dependence, she is diagnosed with Adjustment Disorder with Depressed mood. The prognosis from [facility] is 'guarded'. The patient will return on Thursday, October 18 and begin the CD-IOP.

## 2017-09-17 ENCOUNTER — Encounter (HOSPITAL_COMMUNITY): Payer: Self-pay | Admitting: Medical

## 2017-09-17 ENCOUNTER — Other Ambulatory Visit (INDEPENDENT_AMBULATORY_CARE_PROVIDER_SITE_OTHER): Admitting: Psychology

## 2017-09-17 VITALS — BP 130/74 | HR 66 | Ht 65.0 in | Wt 165.5 lb

## 2017-09-17 DIAGNOSIS — Z472 Encounter for removal of internal fixation device: Secondary | ICD-10-CM

## 2017-09-17 DIAGNOSIS — S72402A Unspecified fracture of lower end of left femur, initial encounter for closed fracture: Secondary | ICD-10-CM

## 2017-09-17 DIAGNOSIS — G43101 Migraine with aura, not intractable, with status migrainosus: Secondary | ICD-10-CM | POA: Diagnosis not present

## 2017-09-17 DIAGNOSIS — T7432XS Child psychological abuse, confirmed, sequela: Secondary | ICD-10-CM

## 2017-09-17 DIAGNOSIS — Z9884 Bariatric surgery status: Secondary | ICD-10-CM | POA: Diagnosis not present

## 2017-09-17 DIAGNOSIS — F4321 Adjustment disorder with depressed mood: Secondary | ICD-10-CM | POA: Diagnosis not present

## 2017-09-17 DIAGNOSIS — F4323 Adjustment disorder with mixed anxiety and depressed mood: Secondary | ICD-10-CM | POA: Diagnosis not present

## 2017-09-17 DIAGNOSIS — G894 Chronic pain syndrome: Secondary | ICD-10-CM

## 2017-09-17 DIAGNOSIS — F1020 Alcohol dependence, uncomplicated: Secondary | ICD-10-CM

## 2017-09-17 DIAGNOSIS — M0579 Rheumatoid arthritis with rheumatoid factor of multiple sites without organ or systems involvement: Secondary | ICD-10-CM

## 2017-09-17 DIAGNOSIS — F4310 Post-traumatic stress disorder, unspecified: Secondary | ICD-10-CM | POA: Diagnosis not present

## 2017-09-17 MED ORDER — PREGABALIN 150 MG PO CAPS: 150.0000 mg | ORAL_CAPSULE | Freq: Three times a day (TID) | ORAL | 2 refills | Status: DC
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
## 2017-09-17 NOTE — Progress Notes (Signed)
Psychiatric Initial Adult Assessment   Patient Identification: Jasmine Richard MRN:  [id number] Date of Evaluation:  05/28/2015 Referral Source: PCP Dr Felipa Eth Chief Complaint:      Chief Complaint    Alcohol Problem     Visit Diagnosis: No diagnosis found. Diagnosis:       Patient Active Problem List   Diagnosis Date Noted  . Alcohol use disorder, severe, dependence [F10.20] 05/27/2015  . 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 Chronic migraine without aura without status migrainosus, not intractable [G43.709] 12/29/2014  . Aortic stenosis [I35.0] 12/29/2014  . Duodenogastric reflux [K21.9] 12/29/2014  . Systolic murmur [L87.5] 64/33/2951  . GERD (gastroesophageal reflux disease) [K21.9] 02/01/2013  . Insomnia [G47.00] 02/01/2013  . Psoriatic arthritis [L40.50] 02/01/2013  . S/P gastric bypass [Z98.84] 02/01/2013   History of Present Illness:               : The patient is a 61 yo widowed, Caucasian, woman seeking entry into the CD-IOP. She lives alone here in [place]. The patient was discharged this past Sunday after successfully completing an alcohol detox in [place], [place]. Upon returning to [facility], she contacted her PCP, Lajean Manes, MD and he instructed her to seek outpatient treatment here at [facility]. The patient reported she and her husband had not been drinkers when their children were young, but they enjoyed a cocktail or glass of wine in later years. At best, she was a social drinker, but in 2013, her husband collapsed after suffering a cerebral aneurysm while on a business trip to [place]. The family gathered in [place] and he died, 5 days later, on 07/19/2023 after the family instructed the life support to be disconnected. The patient reported she was completely devastated with his sudden death and as time passed, she began 'self-medicating' to ease the pain. Last week, the patient was having the king-size  bed moved out of the master bedroom and 2 friends were with her. At one point, she had trouble speaking clearly and when 1 of the friends smelled the ginger ale bottle the patient was drinking out of, she discovered it was wine. They phoned the patient's daughter and when she came to the house, her mother lied to her about the wine. The patient was, subsequently, confronted by her children and agreed to a detox. The patient admitted that she wanted to go somewhere away from town so she wouldn't see anyone she knew, but today she admitted that this was foolish thinking and didn't help the continuum of care. She presented today as understanding and accepting of her status as alcohol dependent and determined to do whatever it takes to address this problem. In addition to her alcohol dependence, the patient is also diagnosed with depression and GAD. In 2002, the patient underwent a gastric by-pass. She has been very dedicated to keeping the weight off and goes to the gym almost every day, works with a trainer and is very conscious of her diet. When asked about any genetic predisposition, the patient reported that her maternal grandfather's brother was an alcoholic. She knew of no other CD in the family, but reported that her brother is obese (400 lbs.) and a paternal uncle weighed almost 600 lbs. at the time of his death. The patient has strong support from her 3 adult children and their spouses. Her daughter and son-in-law live in [place] while her 2 sons live in [place] and [place] with their families. She has a  strong faith and attends the same church she and her husband had been members of for over 30 years. The documentation was reviewed, signed and the orientation completed accordingly. The patient will return tomorrow and begin the CD-IOP.  Pt reports she has never had a sense of self esteem relying on her husband who she met at 52 yrs of age.She has no knowledge of chemical dependency or codependency  in her family.After her husband's death she began to rely on xanax to attend social functions but preferred to remain home isolated and drinking. She gives a hx of RA and Migraines for which she had a prescription for Hydrocodone that she finds necessary to relieve her pain . Her Imitrex only relieves the aura (?)Her pharmacist son "threw out" this prescription.She is no longer taking xanax.She also had rx for Ambien but has been put on Trazodone for sleep now. She was started on Lexapro at Detox center.Due to her gastric bypass she is only taking 5 mg to start and is planning to increase to 10 mg next week.She would like to treplace her Hydrocodone and xanax with nonaddictive alternatives.     Associated Signs/Symptoms: Depression Symptoms:  depressed mood,low self esteem, sleep disturbance, appetite decreased(improving now) (Hypo) Manic Symptoms:  none Anxiety Symptoms:  Panic Symptoms, Obsessive Compulsive Symptoms:   None,, Social Anxiety, Specific Phobias,Claustrophobia Psychotic Symptoms:Alcohol use related only  Delusions, Hallucinations: None Ideas of Reference, Paranoia, none PTSD Symptoms: Had a traumatic exposure:  Husband had CVA August 2013-5 days later she and family took hinm off life support Had a traumatic exposure in the last month:  Children put her in car and took her to [facility] for detox unsuspecting. On return home she found children had removed husbands clothing she had kept in closet Re-experiencing:  Intrusive Thoughts Hypervigilance:  Negative Hyperarousal:  Irritability/Anger Sleep Avoidance:  Decreased Interest/Participation  Past Medical History:  Past Medical History  Diagnosis Date  . GERD (gastroesophageal reflux disease)   . Psoriatic arthritis     Dr. Gavin Pound  . Osteoarthritis           Past Surgical History  Procedure Laterality Date  . Cesarean section      x2  . Tonsillectomy and adenoidectomy  1960  . Hysteroscopy w/d&c   07    no polyps  . Gastric bypass  2002    lost 140 lb  . Cholecystectomy  1984  . Colonoscopy  2005    recheck in 5 years  . Tarsal metatarsal arthrodesis  11/28/2012    Procedure: TARSAL METATARSAL FUSION;  Surgeon: Colin Rhein, MD;  Location: [facility];  Service: Orthopedics;  Laterality: Right;  right 2nd and 3rd TMT joint fusion local bone graft stress x ray foot   . Ulnar tunnel release  09/2014  . Carpal tunnel release  09/2014  . Tee without cardioversion N/A 01/07/2015    Procedure: TRANSESOPHAGEAL ECHOCARDIOGRAM (TEE);  Surgeon: Dorothy Spark, MD;  Location: [facility];  Service: Cardiovascular;  Laterality: N/A;    Substance Abuse History in the last 12 months: Substance Age of 1st Use Last Use Amount Specific Type  Nicotine  never 0 0 0  Alcohol 16 05/19/15 72 oz wine  Cannabis 0 0 0 0  Opiates ? 04/2015 RX/denies abuse Hydrocodone  Cocaine 0 0 0 0  Methamphetamines 0 0 0 0  LSD 0 0 0 0  Ecstasy 0 0 0 0  Benzodiazepines 56 05/22/15 0.25 mg Xanax  Caffeine  Inhalants      Others:                          Family History:        Family History  Problem Relation Age of Onset  . Cervical cancer Mother   . Hypertension Father   . Heart failure Father   . Hypertension Brother     also with hip replacements  . Hyperlipidemia Brother    Social History:   History        Social History  . Marital Status: Widowed    Spouse Name: N/A  . Number of Children: 3  . Years of Education: N/A         Social History Main Topics  . Smoking status: Never Smoker   . Smokeless tobacco: Never Used  . Alcohol Use: Yes     Comment: occ  . Drug Use: No  . Sexual Activity: No     Comment: husband passed 06/2012       Other Topics Concern  . None   Social History Narrative   Additional Social History:   Musculoskeletal: Strength & Muscle Tone: within normal limits Gait &  Station: normal Patient leans: N/A  Psychiatric Specialty Exam: HPI see above  ROS General: No chills, fever, night sweats or weight changes. Cardiovascular: No chest pain, dyspnea on exertion, edema, orthopnea, palpitations, paroxysmal nocturnal dyspnea. Dermatological: No rash, lesions/masses Respiratory: No cough, dyspnea Urologic: No hematuria, dysuria Abdominal: No nausea, vomiting, diarrhea, bright red blood per rectum, melena, or hematemesis Neurologic: No visual changes, wkns,  Psychiatric:+anxiety;depression;alcohol dependence;insomnia;PTSD/                               - SI;HI, Hallucinations;psychosis  Last menstrual period 04/19/2011.There is no weight on file to calculate BMI.  General Appearance: Fairly Groomed  Eye Contact:  Good  Speech:  Clear and Coherent  Volume:  Normal  Mood:  Anxious and Dysphoric PHQ9-14  Affect:  Congruent  Thought Process:  Coherent and emotional  Orientation:  Full (Time, Place, and Person)  Thought Content:  WDL and Rumination  Suicidal Thoughts:  No  Homicidal Thoughts:  No  Memory:  Affected by drinking temporarily at timedes esp with blackouts  Judgement:  Impaired  Insight:  Lacking  Psychomotor Activity:  Normal  Concentration:  Good  Recall:  Good  Fund of Knowledge:Good  Language: Good  Akathisia:  Negative  Handed:  Right  AIMS (if indicated):  na  Assets:  Desire for Improvement Financial Resources/Insurance Housing Resilience Social Support  ADL's:  Intact  Cognition: WNL  Sleep:  Requires sleep aid-rx trazodone  ambien stopped   Is the patient at risk to self?  No. Has the patient been a risk to self in the past 6 months?  No. Has the patient been a risk to self within the distant past?  No. Is the patient a risk to others?  No. Has the patient been a risk to others in the past 6 months?  No. Has the patient been a risk to others within the distant past?  No.  Allergies:  No Known Allergies Current  Medications:       Current Outpatient Prescriptions  Medication Sig Dispense Refill  . adalimumab (HUMIRA) 40 MG/0.8ML injection Inject 40 mg into the skin every 14 (fourteen) days.    . ALPRAZolam (XANAX) 0.5 MG tablet TAKE  1 TABLET AT BEDTIME AS NEEDED FOR ANXIETY. (Patient not taking: Reported on 05/27/2015) 30 tablet 1  . Ascorbic Acid (VITAMIN C) 1000 MG tablet Take 1,000 mg by mouth daily.    Marland Kitchen BIOTIN PO Take 1 tablet by mouth daily.    . busPIRone (BUSPAR) 10 MG tablet Take 1 tablet (10 mg total) by mouth 3 (three) times daily. 90 tablet 0  . Calcium Carbonate-Vit D-Min (CALCIUM 1200 PO) Take 1 tablet by mouth 2 (two) times daily.    . cetirizine (ZYRTEC) 10 MG tablet Take 10 mg by mouth daily.    . Cholecalciferol (VITAMIN D3) 2000 UNITS TABS Take 1 tablet by mouth 2 (two) times daily.    . cyanocobalamin (,VITAMIN B-12,) 1000 MCG/ML injection Inject 1 mL (1,000 mcg total) into the muscle every 30 (thirty) days. 10 mL 1  . folic acid (FOLVITE) 1 MG tablet Take 2 mg by mouth 2 (two) times daily.    Marland Kitchen GLUCOSAMINE-CHONDROITIN PO Take by mouth 2 (two) times daily.    Marland Kitchen HYDROcodone-acetaminophen (VICODIN) 5-500 MG per tablet Take 1 tablet by mouth every 6 (six) hours as needed. (Patient not taking: Reported on 05/27/2015) 30 tablet 0  . isometheptene-acetaminophen-dichloralphenazone (MIDRIN) 65-100-325 MG capsule Take 1 capsule by mouth every 4 (four) hours as needed for migraine. Maximum 5 capsules in 12 hours for migraine headaches, 8 capsules in 24 hours for tension headaches. 30 capsule 2  . MAGNESIUM PO Take 250 mg by mouth 2 (two) times daily.    . meloxicam (MOBIC) 15 MG tablet Take 15 mg by mouth daily.    . metaxalone (SKELAXIN) 800 MG tablet TAKE 1 TABLET 3 TIMES A DAY. 40 tablet 1  . methotrexate (RHEUMATREX) 2.5 MG tablet Take 12 mg by mouth once a week.    . MULTIPLE VITAMIN PO Take 0.5 tablets by mouth 2 (two) times daily.    Marland Kitchen omeprazole (PRILOSEC) 40 MG  capsule Take 40 mg by mouth 2 (two) times daily.    Marland Kitchen POTASSIUM PO Take 250 mg by mouth 2 (two) times daily.    . Probiotic Product (PROBIOTIC PO) Take 1 tablet by mouth daily.    . Pyridoxine HCl (VITAMIN B6 PO) Take 400 mg by mouth 2 (two) times daily as needed.    . SUMAtriptan (IMITREX) 100 MG tablet May repeat in 2 hours if headache persists or recurs. 10 tablet 5  . zolpidem (AMBIEN) 10 MG tablet TAKE 1 TABLET AT BEDTIME AS NEEDED FOR SLEEP. (Patient not taking: Reported on 05/27/2015) 30 tablet 5   No current facility-administered medications for this visit.    Previous Psychotropic Medications: Yes see list  Substance Abuse History in the last 12 months:  Yes.    See above Consequences of Substance Abuse: Medical Consequences:  None known Legal Consequences:  None Family Consequences:  3 children 33/31/27 upset and frightened Blackouts:   DT's: Withdrawal Symptoms:   Tremors  Medical Decision Making:  Review and summation of old records (2), New Problem, with no additional work-up planned (3), Review of Medication Regimen & Side Effects (2) and Review of New Medication or Change in Dosage (2)  Treatment Plan Summary: [facility] [place] CD IOP Labs: UDS per protocol;Referrals: Other: none Rx Buspar trial for anxiety;Midrin for migraine pain FU prn

## 2017-09-18 ENCOUNTER — Encounter (HOSPITAL_COMMUNITY): Payer: Self-pay | Admitting: Psychology

## 2017-09-18 NOTE — Progress Notes (Signed)
    Daily Group Progress Note  Program: CD-IOP   09/18/2017 [name] [id number]  Diagnosis:  Alcohol use disorder, severe, dependence (HCC)   Sobriety Date: 10/8  Group Time: 1-2:30  Participation Level: Active  Behavioral Response: Appropriate and Sharing  Type of Therapy: Process Group  Interventions: CBT and Motivational Interviewing  Topic: Patients were active and engaged in group process session with an emphasis on discussing early recovery strategies, "shining moments" and "speedbumps". Some UDS results were returned and discussed among group members.      Group Time: 2:30-4  Participation Level: Active  Behavioral Response: Appropriate and Sharing  Type of Therapy: Psycho-education Group  Interventions: CBT, Motivational Interviewing and Supportive  Topic: Patients were active and engaged in group psychoeducation session. Counselors facilitated discussion that centered on recovery-related topics such as AA, feelings, 12 Steps, and sobriety. Pts shared openly, gave, and received feedback to each other.   Summary: Patient was active and engaged in session. She listened actively and shared openly about her past struggles w/ alcoholism. She stated she had 6 mo of sobriety last time she tried, and she was intent on learning better strategies for staying sober this time. Pt reports she attended 2 new meetings around town since she has been in a pattern of going to old meetings. She reported she successfully got the interlock breathalyzer installed in her car, voluntarily, at the insistence of her adult son who is a Clinical research associate. Pt stated she is "primarily concerned w/ staying sober and winning back the trust of her adult children".    UDS collected: No Results: negative  AA/NA attended?: YesWednesday and Thursday  Sponsor?: No   Jasmine Richard, LPCA LCASA 09/18/2017 8:33 AM

## 2017-09-19 ENCOUNTER — Other Ambulatory Visit (HOSPITAL_COMMUNITY): Payer: Self-pay

## 2017-09-19 ENCOUNTER — Other Ambulatory Visit (HOSPITAL_COMMUNITY): Admitting: Psychology

## 2017-09-19 ENCOUNTER — Encounter (HOSPITAL_COMMUNITY): Payer: Self-pay | Admitting: Psychology

## 2017-09-19 ENCOUNTER — Other Ambulatory Visit (HOSPITAL_COMMUNITY): Payer: Self-pay | Admitting: Medical

## 2017-09-19 DIAGNOSIS — F1020 Alcohol dependence, uncomplicated: Secondary | ICD-10-CM

## 2017-09-19 MED ORDER — PREGABALIN 150 MG PO CAPS: 150.0000 mg | ORAL_CAPSULE | Freq: Three times a day (TID) | ORAL | 2 refills | Status: DC

## 2017-09-19 NOTE — Progress Notes (Signed)
    Daily Group Progress Note  Program: CD-IOP   Group Time: 1-2:30 pm  Participation Level: Actve  Behavioral Response: Appropriate and sharing  Type of Therapy: Process  Topic: Process: The first half of group was spent in process. Members shared about their struggles and successes in early recovery. Two members met with the program director during the session. Four random drug tests were collected.   Group Time: 2:30-4 pm  Participation Level: Active  Behavioral Response: Sharing  Type of Therapy: Psycho-Ed  Topic:Psycho-Ed: Experiential Interactive Exercise: The second half of group was spent in a psycho-ed. Members broke up into couples and one member was given a drawing and the other a blank piece of paper. Sitting back to back, the member with the picture was to instruct their partner and explain how to draw their picture. The session was timed and at the end of the 10 minute exercise, a lively discussion ensued. The importance of communication was high-lighted and the ways to enhance communication identified.     Summary: Patient was attentive and engaged in group. She provided helpful feedback to another member regarding her mother's unhealthy behaviors. The patient noted that her mother had smoked cigarettes for many years and she hated her smoking and encouraged her to stop smoking. The patient reported her mother died of ovarian cancer, which smoking had directly contributed to and yet, she still smoked until she died. The patient admitted that sometimes, "I wish I had cancer because then I would get sympathy". She clearly isn't getting any sympathy from family or friends with repeated relapses. The patient reported she is working to regain the trust of her three adult children. She explained that it wasn't the fact that she was drinking, but that "I lied about it" that has so badly damaged her relationships with them. The patient pointed out that getting the 'blow and go'  installed in her car and being here in the CD-IOP are some of the ways she is working to be accountable and regain their trust. The patient's shining moment was spending lunch on Tuesday with her daughter and the in-law. She was able to spend time with her 6 mo granddaughter. Her speed bump was not being able to get her prescription for the program director filled at the drug store because of an error in the way it was written. In the psycho-ed, the patient was the person drawing in response to her partner who was describing what she should draw. The patient admitted she didn't ask many questions because her partner described things so well. This patient's drawing was probably the most accurate of all the groups. She made some good comments and responded well to this intervention.    Family Program: No   UDS collected: yes, Pending  AA/NA attended?: Yes, Tuesday and Wednesday  Sponsor?: Yes   Brandon Melnick, LCAS

## 2017-09-20 ENCOUNTER — Other Ambulatory Visit (HOSPITAL_COMMUNITY): Admitting: Psychology

## 2017-09-20 DIAGNOSIS — F4321 Adjustment disorder with depressed mood: Secondary | ICD-10-CM

## 2017-09-20 DIAGNOSIS — F1020 Alcohol dependence, uncomplicated: Secondary | ICD-10-CM

## 2017-09-21 ENCOUNTER — Encounter (HOSPITAL_COMMUNITY): Payer: Self-pay | Admitting: Psychology

## 2017-09-21 NOTE — Progress Notes (Signed)
    Daily Group Progress Note  Program: CD-IOP   09/21/2017 Jasmine Richard [id number]  Diagnosis:  Alcohol use disorder, severe, dependence (HCC)  Unresolved grief   Sobriety Date: 10/8  Group Time: 1-2:30  Participation Level: Active  Behavioral Response: Appropriate and Sharing  Type of Therapy: Process Group  Interventions: CBT, Motivational Interviewing and Supportive  Topic: Patients were active and engaged for process session. Counselors led pts in discussing their feelings, thoughts, and behaviors related to recovery, including "shining moments" and "speedbumps" since yesterday. Pts discussed issues of panhandling, addiction, and guilt around helping others vs. choosing not to give money.     Group Time: 2:30-4  Participation Level: Active  Behavioral Response: Appropriate and Sharing  Type of Therapy: Psycho-education Group  Interventions: CBT and Assertiveness Training  Topic: Patients were active and engaged in psychoeducation session. Counselors led topic of communication. Pts were provided a handout and discussed 4 styles of communication including passive, aggressive, passive-aggressive, and assertive w/ an emphasis on being assertive when possible. Pts practiced role-plays that modeled assertive communication. Pts participated actively and reported that the exercise gave them a new insight into their own personal style of communication.   Summary: Pt was active and engaged in session. She reported she did not attend any AA meetings since yesterday. She states she went on a "friend date" w/ another group member and discussed recovery at lunch and went to the local AA store and bought $90 worth of books and AA materials. Pt reported on her goal list that she is working on at the direction of her adult children. Pt stated she is working to organize her house better but is struggling w/ her bad leg and needing more help. Pt admitted she did not want help  since she likes to "do it alone". Other group members encouraged her to ask for help so she would not build resentment or hurt herself. Pt admitted she was an overall passive communicator and struggled to be assertive in her role play. Pt showed courage by doing a role play since she stated she "hates doing this kind of thing in front of others who are looking at her".    UDS collected: No Results: negative  AA/NA attended?: No  Sponsor?: Yes   Wes Swan, LPCA LCASA 09/21/2017 11:48 AM
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
## 2017-09-24 ENCOUNTER — Other Ambulatory Visit (INDEPENDENT_AMBULATORY_CARE_PROVIDER_SITE_OTHER): Admitting: Medical

## 2017-09-24 ENCOUNTER — Encounter (HOSPITAL_COMMUNITY): Payer: Self-pay | Admitting: Medical

## 2017-09-24 DIAGNOSIS — T7432XS Child psychological abuse, confirmed, sequela: Secondary | ICD-10-CM

## 2017-09-24 DIAGNOSIS — S72402A Unspecified fracture of lower end of left femur, initial encounter for closed fracture: Secondary | ICD-10-CM

## 2017-09-24 DIAGNOSIS — G43101 Migraine with aura, not intractable, with status migrainosus: Secondary | ICD-10-CM

## 2017-09-24 DIAGNOSIS — F1020 Alcohol dependence, uncomplicated: Secondary | ICD-10-CM

## 2017-09-24 DIAGNOSIS — M0579 Rheumatoid arthritis with rheumatoid factor of multiple sites without organ or systems involvement: Secondary | ICD-10-CM

## 2017-09-24 DIAGNOSIS — F4310 Post-traumatic stress disorder, unspecified: Secondary | ICD-10-CM

## 2017-09-24 DIAGNOSIS — G894 Chronic pain syndrome: Secondary | ICD-10-CM

## 2017-09-24 DIAGNOSIS — Z9884 Bariatric surgery status: Secondary | ICD-10-CM

## 2017-09-24 DIAGNOSIS — F419 Anxiety disorder, unspecified: Secondary | ICD-10-CM

## 2017-09-24 DIAGNOSIS — F4321 Adjustment disorder with depressed mood: Secondary | ICD-10-CM

## 2017-09-24 MED ORDER — TRAZODONE HCL 100 MG PO TABS: ORAL_TABLET | ORAL | 1 refills | Status: AC

## 2017-09-24 NOTE — Progress Notes (Addendum)
Patient ID: Jasmine Richard, female   DOB: 1955/12/05, 61 y.o.   MRN: [id number] Fu for Genesight testing and medication management S Pt Jasmine Richard confirms her current Psych med prescriptions are genetically compatible and do not require change or adjustment.Pt says family noticesmood swings and thought she should possibly be on something else BUT PT ADMITS THESE SWINGS ARE MOST PROBABLY DUE TO FAILURE TO GRIEVE AS WE DISCUSSED AT INTAKE. Still awaiting approval for her Lyrica rx O Mental Status Examination   Appearance: Well dressed Well groomed Alert: Yes Attention: fair  Cooperative: Yes Eye Contact: Fair Speech: normal in volume, rate, tone, spontaneous Psychomotor Activity: Normal Memory/Concentration: OK Oriented: person, place and situation Mood: Euthymic Affect: Congruent and Full Range Thought Processes and Associations: Goal Directed and Intact Fund of Knowledge: Fair Thought Content: NO Suicidal ideation, Homicidal ideation, Auditory hallucinations, Visual hallucinations, Delusions and Paranoia,  Insight: Fair to poor Judgement: Fair to poor Language: Fair  A- Genesight confirms efficacy of medications Awaiting PA for Lyrica for chronic anxiety  P- Continue current plan including completion of grief counseling

## 2017-09-25 ENCOUNTER — Encounter (HOSPITAL_COMMUNITY): Payer: Self-pay | Admitting: Psychology
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
## 2017-09-25 NOTE — Progress Notes (Signed)
Jasmine Richard is a 61 y.o. female patient. CD-IOP: Treatment Planning Session. Met with the patient as scheduled this afternoon. She reported she was doing well and had attended the [employer] at the [facility] last night. She was considering making it her home group. When asked about her sponsor and the requirements she had agreed upon, the patient reported her sponsor was a professor at one of the [employer] and had not asked that she phone her daily. When the patient reported this on Monday in group, her fellow group members had questioned her about only calling when she needed to talk. They wondered if this was not what she needed and if she didn't need to be required to phone daily? She had admitted she wasn't sure what she needed. Today, she reported she had spoken with her sponsor and she had agreed she could contact her daily, but she might not be able to respond immediately. The patient was receptive when asked about her goals of treatment. She identified remaining alcohol-free as her #1 goal of treatment. She reported she had tried, but been unsuccessfully doing it on her own. When asked about any other goals, the patient readily identified regaining her adult children's trust in her and rebuilding the relationship she has with them. The patient identified the ways she will regain that trust, including staying sober, managing the 'blow and go' properly, 'walking the talk', spending more time with them and eventually, caring for her grandchildren while they are elsewhere. The remainder of the session was spent discussing how to repair the friendships that seem to have deteriorated since her revelation about alcoholism and subsequent treatment episodes. The patient cried as she revealed the details of the betrayal she had experienced. The question remains whether she intends to pursue re-engaging with those same women and whether their rejection is worth her efforts to rebuild. Is  this something she wants to pursue? The patient reported she intended to schedule appointments to have her eyes examined and to have a mammogram. When questioned about pursuing counseling with Hospice, the patient reported she didn't think she was ready or capable to do this at this time and has opted to wait and complete treatment before pursuing this part of her life. The session ended with the treatment plan signed and the patient sharing her plans for the day ahead. She was uncertain whether she would dress for the holiday tomorrow, but assured me that she would be in group. We will continue to follow closely in the days ahead. Her sobriety date remains 09/03/17.       Brandon Melnick, LCAS

## 2017-09-26 ENCOUNTER — Other Ambulatory Visit (HOSPITAL_COMMUNITY): Admitting: Psychology

## 2017-09-26 ENCOUNTER — Encounter: Payer: Self-pay | Admitting: Psychiatry

## 2017-09-26 DIAGNOSIS — F1020 Alcohol dependence, uncomplicated: Secondary | ICD-10-CM | POA: Diagnosis not present

## 2017-09-26 DIAGNOSIS — F4321 Adjustment disorder with depressed mood: Secondary | ICD-10-CM

## 2017-09-26 DIAGNOSIS — F4310 Post-traumatic stress disorder, unspecified: Secondary | ICD-10-CM

## 2017-09-26 DIAGNOSIS — F419 Anxiety disorder, unspecified: Secondary | ICD-10-CM

## 2017-09-26 NOTE — Addendum Note (Signed)
Addended by: Court Joy on: 09/26/2017 05:39 PM   Modules accepted: Orders

## 2017-09-26 NOTE — Progress Notes (Unsigned)
BH MD/PA/NP OP Progress Note  09/26/2017 4:21 PM Jasmine Richard  MRN:  [id number]  Chief Complaint:  HPI:  Visit Diagnosis: No diagnosis found.  Past Psychiatric History: ***  Past Medical History:  Past Medical History:  Diagnosis Date  . ADD (attention deficit disorder)   . Anxiety   . Depression   . Diverticulosis   . GERD (gastroesophageal reflux disease)   . H/O ETOH abuse    recovered  . Headache   . Heart murmur   . IDA (iron deficiency anemia)   . Insomnia   . Osteoarthritis   . Psoriatic arthritis (HCC)    Dr. Zenovia Jordan    Past Surgical History:  Procedure Laterality Date  . CARPAL TUNNEL RELEASE  09/2014  . CESAREAN SECTION     x2  . CHOLECYSTECTOMY  1984  . COLONOSCOPY  2005   recheck in 5 years  . FEMUR CLOSED REDUCTION Left 11/19/2015   Procedure: CLOSED REDUCTION LEFT DISTAL FEMUR;  Surgeon: Beverely Low, MD;  Location: [facility];  Service: Orthopedics;  Laterality: Left;  Marland Kitchen GASTRIC BYPASS  2002   lost 140 lb  . HARDWARE REMOVAL Left 03/22/2016   Procedure: LEFT KNEE HARDWARE REMOVAL;  Surgeon: Tarry Kos, MD;  Location: [facility];  Service: Orthopedics;  Laterality: Left;  . HARDWARE REMOVAL Left 08/20/2017   Procedure: Removal of retained hardware in left distal femur;  Surgeon: Durene Romans, MD;  Location: [facility];  Service: Orthopedics;  Laterality: Left;  90 mins  . HYSTEROSCOPY W/D&C  07   no polyps  . ORIF FEMUR FRACTURE Left 11/20/2015   Procedure: OPEN REDUCTION INTERNAL FIXATION (ORIF) DISTAL FEMUR FRACTURE;  Surgeon: Tarry Kos, MD;  Location: [facility];  Service: Orthopedics;  Laterality: Left;  . TARSAL METATARSAL ARTHRODESIS  11/28/2012   Procedure: TARSAL METATARSAL FUSION;  Surgeon: Sherri Rad, MD;  Location: [facility];  Service: Orthopedics;  Laterality: Right;  right 2nd and 3rd TMT joint fusion local bone graft stress x ray foot   . TEE WITHOUT CARDIOVERSION N/A 01/07/2015   Procedure:  TRANSESOPHAGEAL ECHOCARDIOGRAM (TEE);  Surgeon: Lars Masson, MD;  Location: [facility];  Service: Cardiovascular;  Laterality: N/A;  . TONSILLECTOMY    . TONSILLECTOMY AND ADENOIDECTOMY  1960  . ULNAR TUNNEL RELEASE  09/2014    Family Psychiatric History: ***  Family History:  Family History  Problem Relation Age of Onset  . Cervical cancer Mother   . Ulcerative colitis Mother   . Hypertension Father   . Heart failure Father   . Hypertension Brother        also with hip replacements  . Hyperlipidemia Brother   . Alcohol abuse Paternal Uncle     Social History:  Social History   Social History  . Marital status: Widowed    Spouse name: N/A  . Number of children: 3  . Years of education: N/A   Social History Main Topics  . Smoking status: Never Smoker  . Smokeless tobacco: Never Used  . Alcohol use 3.6 oz/week    6 Glasses of wine per week     Comment: reports relapsed 1 week ago  . Drug use: No  . Sexual activity: No     Comment: husband passed 06/2012   Other Topics Concern  . Not on file   Social History Narrative  . No narrative on file    Allergies: No Known Allergies  Metabolic Disorder Labs: No  results found for: HGBA1C, MPG No results found for: PROLACTIN No results found for: CHOL, TRIG, HDL, CHOLHDL, VLDL, LDLCALC Lab Results  Component Value Date   TSH 2.15 05/24/2016   TSH 2.150 02/01/2013    Therapeutic Level Labs: No results found for: LITHIUM No results found for: VALPROATE No components found for:  CBMZ  Current Medications: Current Outpatient Prescriptions  Medication Sig Dispense Refill  . Biotin 5000 MCG CAPS Take 5,000 mcg by mouth daily.    . Calcium Carbonate-Vit D-Min (CALCIUM 1200 PO) Take 1 tablet by mouth 2 (two) times daily.    . cyanocobalamin (,VITAMIN B-12,) 1000 MCG/ML injection Inject 1 mL (1,000 mcg total) into the muscle every 30 (thirty) days. 10 mL 1  . docusate sodium (COLACE) 100 MG capsule Take 1 capsule  (100 mg total) by mouth 2 (two) times daily. 10 capsule 0  . escitalopram (LEXAPRO) 20 MG tablet Take 1 tablet (20 mg total) by mouth daily. 30 tablet 2  . ferrous sulfate 325 (65 FE) MG tablet Take 325 mg by mouth daily with breakfast.    . HYDROcodone-acetaminophen (NORCO) 7.5-325 MG tablet     . loperamide (IMODIUM) 2 MG capsule Take 2-4 mg by mouth See admin instructions. Take 4mg  at onset of diarrhea then 2mg  every 2 hours as needed for diarrhea. Do not exceed 16mg  in 24hrs.    MAGNESIUM-POTASSIUM PO Take 1 tablet by mouth 2 (two) times daily.    . meloxicam (MOBIC) 15 MG tablet     . methocarbamol (ROBAXIN) 500 MG tablet Take 1 tablet (500 mg total) by mouth every 6 (six) hours as needed for muscle spasms. 30 tablet 0  . Multiple Vitamins-Minerals (MULTIVITAMIN WITH MINERALS) tablet Take 0.5 tablets by mouth 2 (two) times daily.     . naltrexone (DEPADE) 50 MG tablet Take 50 mg by mouth at bedtime.  0  . omeprazole (PRILOSEC) 20 MG capsule Take 20 mg by mouth 2 (two) times daily before a meal.    . oxybutynin (DITROPAN-XL) 5 MG 24 hr tablet Take 5 mg by mouth daily.    . polyethylene glycol (MIRALAX / GLYCOLAX) packet Take 17 g by mouth 2 (two) times daily. 14 each 0  . pregabalin (LYRICA) 150 MG capsule Take 1 capsule (150 mg total) by mouth 3 (three) times daily. 90 capsule 2  . Probiotic Product (PROBIOTIC DAILY PO) Take 1 capsule by mouth daily.    PSEUDOEPHEDRINE-GUAIFENESIN CR PO Take 1 tablet by mouth daily.    . SUMAtriptan (IMITREX) 100 MG tablet May repeat in 2 hours if headache persists or recurs. (Patient taking differently: Take 100 mg by mouth every 2 (two) hours as needed for migraine. May repeat in 2 hours if headache persists or recurs.) 10 tablet 5  . traZODone (DESYREL) 100 MG tablet Take 1-1 1/2 tablet HS 45 tablet 1  . valACYclovir (VALTREX) 500 MG tablet Take 500 mg by mouth See admin instructions. Take 4 tablets at first sign of cold sores and 4 tablets 12 hours  later.  0  . vitamin C (ASCORBIC ACID) 500 MG tablet Take 500 mg by mouth 2 (two) times daily.     No current facility-administered medications for this visit.      Musculoskeletal: Strength & Muscle Tone: {desc; muscle tone:32375} Gait & Station: {PE GAIT ED Patient leans: {Patient Leans:21022755}  Psychiatric Specialty Exam: ROS  Last menstrual period 04/19/2011.There is no height or weight on file to calculate BMI.  General Appearance: {Appearance:22683}  Eye Contact:  {BHH EYE CONTACT:22684}  Speech:  {Speech:22685}  Volume:  {Volume (PAA):22686}  Mood:  {BHH MOOD:22306}  Affect:  {Affect (PAA):22687}  Thought Process:  {Thought Process (PAA):22688}  Orientation:  {BHH ORIENTATION (PAA):22689}  Thought Content: {Thought Content:22690}   Suicidal Thoughts:  {ST/HT (PAA):22692}  Homicidal Thoughts:  {ST/HT (PAA):22692}  Memory:  {BHH MEMORY:22881}  Judgement:  {Judgement (PAA):22694}  Insight:  {Insight (PAA):22695}  Psychomotor Activity:  {Psychomotor (PAA):22696}  Concentration:  {Concentration:21399}  Recall:  {BHH GOOD/FAIR/POOR:22877}  Fund of Knowledge: {BHH GOOD/FAIR/POOR:22877}  Language: {BHH GOOD/FAIR/POOR:22877}  Akathisia:  {BHH YES OR NO:22294}  Handed:  {Handed:22697}  AIMS (if indicated): {Desc; done/not:10129}  Assets:  {Assets (PAA):22698}  ADL's:  {BHH ZOX'W:96045}  Cognition: {chl bhh cognition:304700322}  Sleep:  {BHH GOOD/FAIR/POOR:22877}   Screenings: GAD-7     Counselor from 09/11/2017 in BEHAVIORAL HE[facility] OUTPATIENT THERAPY [place]  Total GAD-7 Score  5    PHQ2-9     Counselor from 09/11/2017 in BEHAVIORAL HEALTH OUTPATIENT THERAPY [place]  PHQ-2 Total Score  3  PHQ-9 Total Score  10       Assessment and Plan: ***   Maryjean Morn, PA-C 09/26/2017, 4:21 PM

## 2017-09-27 ENCOUNTER — Other Ambulatory Visit (HOSPITAL_COMMUNITY): Attending: Psychiatry | Admitting: Psychology

## 2017-09-27 ENCOUNTER — Encounter (HOSPITAL_COMMUNITY): Payer: Self-pay | Admitting: Psychology

## 2017-09-27 DIAGNOSIS — G894 Chronic pain syndrome: Secondary | ICD-10-CM | POA: Diagnosis not present

## 2017-09-27 DIAGNOSIS — F4310 Post-traumatic stress disorder, unspecified: Secondary | ICD-10-CM | POA: Insufficient documentation

## 2017-09-27 DIAGNOSIS — Z9884 Bariatric surgery status: Secondary | ICD-10-CM | POA: Insufficient documentation

## 2017-09-27 DIAGNOSIS — M0579 Rheumatoid arthritis with rheumatoid factor of multiple sites without organ or systems involvement: Secondary | ICD-10-CM | POA: Diagnosis not present

## 2017-09-27 DIAGNOSIS — F4329 Adjustment disorder with other symptoms: Secondary | ICD-10-CM | POA: Insufficient documentation

## 2017-09-27 DIAGNOSIS — G4700 Insomnia, unspecified: Secondary | ICD-10-CM | POA: Insufficient documentation

## 2017-09-27 DIAGNOSIS — D509 Iron deficiency anemia, unspecified: Secondary | ICD-10-CM | POA: Insufficient documentation

## 2017-09-27 DIAGNOSIS — F4321 Adjustment disorder with depressed mood: Secondary | ICD-10-CM | POA: Diagnosis not present

## 2017-09-27 DIAGNOSIS — F329 Major depressive disorder, single episode, unspecified: Secondary | ICD-10-CM | POA: Diagnosis not present

## 2017-09-27 DIAGNOSIS — S72492S Other fracture of lower end of left femur, sequela: Secondary | ICD-10-CM | POA: Insufficient documentation

## 2017-09-27 DIAGNOSIS — K219 Gastro-esophageal reflux disease without esophagitis: Secondary | ICD-10-CM | POA: Insufficient documentation

## 2017-09-27 DIAGNOSIS — Z79899 Other long term (current) drug therapy: Secondary | ICD-10-CM | POA: Insufficient documentation

## 2017-09-27 DIAGNOSIS — F1020 Alcohol dependence, uncomplicated: Secondary | ICD-10-CM | POA: Insufficient documentation

## 2017-09-27 DIAGNOSIS — Z9049 Acquired absence of other specified parts of digestive tract: Secondary | ICD-10-CM | POA: Diagnosis not present

## 2017-09-27 DIAGNOSIS — X58XXXS Exposure to other specified factors, sequela: Secondary | ICD-10-CM | POA: Insufficient documentation

## 2017-09-27 DIAGNOSIS — F419 Anxiety disorder, unspecified: Secondary | ICD-10-CM | POA: Diagnosis not present

## 2017-09-27 NOTE — Progress Notes (Signed)
    Daily Group Progress Note  Program: CD-IOP   09/27/2017 Edison Simon [id number]  Diagnosis:  Alcohol use disorder, severe, dependence ([place])  Unresolved grief  Post traumatic stress disorder (PTSD)  Chronic anxiety   Sobriety Date: 10/8  Group Time: 1-2:30  Participation Level: Active  Behavioral Response: Appropriate and Sharing  Type of Therapy: Process Group  Interventions: CBT and Motivational Interviewing  Topic: Patients were active and engaged in process group session. Some pts met w/ Darlyne Russian, PA for medication updates. Pts shared about their challenges and successes, "speedbumps and shining moments" in recovery. Pts shared feedback and discussed their lives intimately. 2 members engaged in a debate over disrespect which sparked the opportunity for counselor to provide insight, awareness, and feedback about communication in groups. Pts were encouraged to share how they felt in the moment.      Group Time: 2:30-4  Participation Level: Active  Behavioral Response: Appropriate and Sharing  Type of Therapy: Psycho-education Group  Interventions: CBT, Motivational Interviewing, Supportive and Meditation: Mindful eating  Topic: Patients were active and engaged in group psychoeducation session. Counselors led a 5 min mindful eating exercise in which pts were invited to eat a raisin slowly. Pts processed their experiences afterward.     Summary: Patient was active and engaged in group. She presented as somewhat reserved but did provide feedback to other members who were asking for help w/ their communication style. Pt discussed her faith and how prayer helped her find a sponsor. She reported she attended 1 AA meeting since last group. Pt reported she "also felt frustrated" by debate b/w 2 other members.   UDS collected: No Results: negative  AA/NA attended?: YesTuesday  Sponsor?: Yes   [name], LPCA LCASA 09/27/2017 11:36 AM

## 2017-09-28 ENCOUNTER — Encounter (HOSPITAL_COMMUNITY): Payer: Self-pay | Admitting: Psychology

## 2017-09-28 NOTE — Progress Notes (Signed)
    Daily Group Progress Note  Program: CD-IOP   09/28/2017 Jasmine Richard [id number]  Diagnosis:  No diagnosis found.   Sobriety Date: 10/8  Group Time: 1-2:30pm  Participation Level: Active  Behavioral Response: Sharing  Type of Therapy: Process Group  Interventions: Supportive  Topic: Process: The first half of group was spent in process with popsicle sticks. After checking in and sharing about their time since we last met, each member drew a popsicle stick and identified their word written on the stick. The words are taken from the 'Feeling Wheel' and members are asked to speak to the topic as it relates to their recovery and lives in sobriety. The group had a guest this afternoon from the PA program at [facility]. The student shared about her life and her intentions around graduation. Three group members were absent today.   Group Time: 2:30-4pm  Participation Level: Active  Behavioral Response: Sharing  Type of Therapy: Psycho-education Group  Interventions: Strength-based  Topic: Psycho-Ed: Emotional Jenga/Graduation. The second half of group was spent in a psycho-ed and the game of [place]. Members pulled a block and shared what they felt, on a physical level, when they read this word. In other words, what physical effect did it have? The exercise teaches members to become more aware and familiar with their feelings. The session ended with a graduation ceremony. The graduating members' husband came to the graduation and her fellow group members shared words of admiration and appreciation. It was emotional and tearful for some and a very powerful ending to this session.   Summary: The patient reported had spoken with her sponsor at length twice since we last met. The patient identified her biggest triggers were internal ones and include feelings of sadness, rejection and hurt. Her last two relapses were both initiated by painful feelings. When the counselor noted  her failure to complete grief counseling over the sudden death of her husband five years ago, she spoke at length about the events of his death along with her experience with Hospice. The patient agreed that she had not completed the grieving process, but admitted she was reluctant to begin that now due to fears of relapsing. She received helpful and supportive feedback from fellow group members. In the psycho-ed, the patient's words were "peaceful" and "perplexed". She successfully described how those felt to her from a physical perspective. The patient shared that her relationships with her three adult children have improved greatly, even in the last two weeks and she feels a degree of peacefulness since they seem to have accepted her and are responding to her improvement in functioning and overall presentation. The patient expressed happiness over having reconnected with the graduating member and expressed anticipation over future lunches and attending meetings and remaining in contact. The patient responded well to this intervention.   UDS collected: No Results:   AA/NA attended?: No  Sponsor?: Yes   Brandon Melnick, LCAS 09/28/2017 9:57 AM

## 2017-10-01 ENCOUNTER — Other Ambulatory Visit (INDEPENDENT_AMBULATORY_CARE_PROVIDER_SITE_OTHER): Admitting: Psychology

## 2017-10-01 ENCOUNTER — Encounter (HOSPITAL_COMMUNITY): Payer: Self-pay | Admitting: Medical

## 2017-10-01 DIAGNOSIS — F419 Anxiety disorder, unspecified: Secondary | ICD-10-CM | POA: Diagnosis not present

## 2017-10-01 DIAGNOSIS — G894 Chronic pain syndrome: Secondary | ICD-10-CM

## 2017-10-01 DIAGNOSIS — F1020 Alcohol dependence, uncomplicated: Secondary | ICD-10-CM

## 2017-10-01 DIAGNOSIS — T7432XS Child psychological abuse, confirmed, sequela: Secondary | ICD-10-CM

## 2017-10-01 DIAGNOSIS — F4310 Post-traumatic stress disorder, unspecified: Secondary | ICD-10-CM

## 2017-10-01 DIAGNOSIS — M0579 Rheumatoid arthritis with rheumatoid factor of multiple sites without organ or systems involvement: Secondary | ICD-10-CM

## 2017-10-01 DIAGNOSIS — F4321 Adjustment disorder with depressed mood: Secondary | ICD-10-CM | POA: Diagnosis not present

## 2017-10-01 DIAGNOSIS — Z9884 Bariatric surgery status: Secondary | ICD-10-CM

## 2017-10-01 DIAGNOSIS — S72492S Other fracture of lower end of left femur, sequela: Secondary | ICD-10-CM
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
## 2017-10-02 ENCOUNTER — Encounter (HOSPITAL_COMMUNITY): Payer: Self-pay | Admitting: Medical

## 2017-10-02 ENCOUNTER — Encounter (HOSPITAL_COMMUNITY): Payer: Self-pay | Admitting: Psychology
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
## 2017-10-02 NOTE — Progress Notes (Signed)
Jasmine Richard is a 61 y.o. female patient. CD-IOP: Individual Counseling Session. Met with the patient this afternoon as scheduled. She had met with her mother-in-law and sister-in-law for lunch. They had enjoyed their time together. Her father-in-law is in declining health and his wife spends most of her time caring for him. It was a treat for her to get out of the house and have lunch. The patient reported that her in-laws have all been supportive of her, "they are a very loyal family", and noted she was disappointed in her own family. When asked about her children, the patient explained that she wasn't referring to her children ('they have come so far'), but with her brother, Clair Gulling. He 'shuns me', she reported. When questioned further about this, the patient reported that he is unwilling to learn about the disease of addiction and considers her weak-willed. She noted, "He didn't want me to have the gastric bypass" and felt like she should lose the weight naturally. Having said this, the patient reminded me that her brother is obese. They were both overweight in childhood and while she had the procedure in 2002 and has kept the weight off, her brother remains extremely overweight. She described him as being arrogant and unwilling to look at himself. We discussed whether she would be willing to address this issue with him and if he is willing to work on repairing their relationship.  She agreed she would consider this. I assigned her to write in her journal about this issue for four consecutive days. The patient assured me that she would do this. She continues to make good progress in her goals of treatment. Including approaching her 30-day sobriety mark on November 8 - Thursday. The patient complained about feeling irritable, clumsy and forgetful and she laughed after I handed her a list of PAWS. She noted that everything she described is identified as a symptom of PAWS. She is attending a minimum of four  meetings per week and has recently secured a sponsor. The sponsor shares the characteristic that she became an alcoholic later in life, as did this patient.  However, I have repeatedly expressed my concern that she insure this woman has the time to spend with her because the patient needs hands-on guidance despite her efforts to resist this accountability. The patient is also making inroads on regaining the trust and confidence of her three adult children and their spouses. She has three grandchildren and regaining their trust and being able to be in the lives of the young one is extremely important.  The patient is progressing in her recovery and sobriety and will pick up her 30-day chip in two days. Her sobriety date remains 09/03/17.        Brandon Melnick, LCAS

## 2017-10-03 ENCOUNTER — Encounter (HOSPITAL_COMMUNITY): Payer: Self-pay

## 2017-10-03 ENCOUNTER — Encounter (HOSPITAL_COMMUNITY): Payer: Self-pay | Admitting: Psychology

## 2017-10-03 ENCOUNTER — Other Ambulatory Visit (HOSPITAL_COMMUNITY)

## 2017-10-04 ENCOUNTER — Other Ambulatory Visit (HOSPITAL_COMMUNITY): Admitting: Psychology

## 2017-10-04 DIAGNOSIS — F4310 Post-traumatic stress disorder, unspecified: Secondary | ICD-10-CM

## 2017-10-04 DIAGNOSIS — F1020 Alcohol dependence, uncomplicated: Secondary | ICD-10-CM

## 2017-10-04 DIAGNOSIS — Z9884 Bariatric surgery status: Secondary | ICD-10-CM

## 2017-10-04 NOTE — Progress Notes (Signed)
Jasmine Richard is a 62 y.o. female patient. CD-IOP: Excused Absence. The patient appeared for group today, but she had to leave shortly after 2 pm for a previously scheduled appointment with her orthopedic surgeon. She underwent surgery in late September and will undergo a knee replacement on December 4. The patient stated she would return if her appointment ended, but she did not return. She will not receive credit nor be billed for this appointment.        [name], LCAS

## 2017-10-05 ENCOUNTER — Encounter (HOSPITAL_COMMUNITY): Payer: Self-pay | Admitting: Psychology

## 2017-10-05 ENCOUNTER — Telehealth (HOSPITAL_COMMUNITY): Payer: Self-pay | Admitting: Psychology

## 2017-10-05 NOTE — Progress Notes (Signed)
    Daily Group Progress Note  Program: CD-IOP   10/05/2017 Edison Simon [id number]  Diagnosis:  No diagnosis found.   Sobriety Date: 09/03/17  Group Time: 1-2:30pm  Participation Level: Active  Behavioral Response: Sharing  Type of Therapy: Process Group  Interventions: Supportive  Topic: Process: The first half of group was spent in process. Members shared about their experiences in early recovery, identifying speed bumps and shining moments. A new group member was present and he introduced himself during this half of group. One drug test was collected while previously collected test results returned.   Group Time: 2:30-4pm  Participation Level: Minimal  Behavioral Response: withdrawn  Type of Therapy: Psycho-education Group  Interventions: Strength-based  Topic: Psycho-Ed: From unhealthy thoughts to relapse prevention. The second half of group was spent in psycho-ed. members began to share the topic on their popsicle stick. The first word or phrase to be addressed in recovery was 'unhealthy thoughts'. The member seemed confused at first, but was eventually able to identify specific things he might to address these unhealthy thoughts. This generated a spontaneous review of the four steps that result in relapse: Trigger, thought, craving, use. This four-step process was discussed at length with all members asked to identify how they will address thoughts, change them and avoid moving into the craving or urge aspect? This generated a lively discussion and the session ended with members displaying a renewed intention around avoiding relapse.  Summary: The patient reported she had met with the orthopedic specialist yesterday to follow up on previous surgery and prepare for her upcoming knee replacement scheduled for December 3. She reported she had visited a former group member at [place] store and purchased an extensive array of makeup from her counter. She challenged  a fellow group member with a question about her mother and this generated further helpful feedback. The patient described an [place] meeting she had attended where the topic was 'pain'. This patient complained that if was filled with women whining about their pain. She noted that despite having lost her husband suddenly, having had six surgeries on her leg and knee and struggling in early recovery, she didn't complain about anything. The group challenged her on this and one member wondered if she was not feeling resentful towards those women in the meeting? Another questioned whether everyone's pain is their own experience and not to be judged by another. This member had little to say and per this counselor, was visibly upset. (Despite our conversation at break, and admitting she felt attacked, she refused to share these feelings with the group). The patient was quiet and somewhat disengaged for the remainder of the group session. As the session winded down, the patient shared that she was having dinner with two of her three children, her mother-in-law and a niece that is in town for the weekend.  The patient started the session engaged, but shut down in response to what she felt was criticism and 'being attacked'. Expressing herself and her feelings is something she has resisted and instead, kept them in until they led her to drink. She will benefit by changing those old patterns that have led her back to drinking.     UDS collected: No Results:  AA/NA attended?: YesWednesday and Thursday  Sponsor?: Yes   Brandon Melnick, LCAS 10/05/2017 9:40 AM

## 2017-10-08 ENCOUNTER — Other Ambulatory Visit (HOSPITAL_COMMUNITY): Admitting: Psychology

## 2017-10-08 DIAGNOSIS — F419 Anxiety disorder, unspecified: Secondary | ICD-10-CM

## 2017-10-08 DIAGNOSIS — F1020 Alcohol dependence, uncomplicated: Secondary | ICD-10-CM

## 2017-10-08 DIAGNOSIS — F4321 Adjustment disorder with depressed mood: Secondary | ICD-10-CM

## 2017-10-09 ENCOUNTER — Encounter (HOSPITAL_COMMUNITY): Payer: Self-pay | Admitting: Psychology

## 2017-10-09 NOTE — Progress Notes (Signed)
    Daily Group Progress Note  Program: CD-IOP   10/09/2017 Jasmine Richard [id number]  Diagnosis:  Alcohol use disorder, severe, dependence (HCC)  Chronic anxiety  Unresolved grief   Sobriety Date: 10/8  Group Time: 1-2:30  Participation Level: Active  Behavioral Response: Appropriate, Sharing and Agitated  Type of Therapy: Process Group  Interventions: CBT  Topic: Counselors led a patient centered process session designed to elicit pt's change talk, discuss their active recovery from mind-altering drugs and alcohol, and help motivate pts to sustain changes. Pts were encouraged to continue meeting their goals of attending AA/NA and using CBT based interventions to prevent relapse and tolerate distressing emotions.     Group Time: 2:30-4  Participation Level: Active  Behavioral Response: Appropriate and Sharing  Type of Therapy: Psycho-education Group  Interventions: CBT and Strength-based  Topic: Counselors led a psychoeducation session aimed at increasing pt's understanding, and insight into their personal values. Pts did a "values sort" ranking their top 10 personal values. Pts discussed the process of sorting and choosing their values w/ each other. Pts appeared engaged and interested.    Summary: Pt was active and engaged in session. She reported she attended 3 AA meetings over the break. She became somewhat defensive when another member was not "judged for his negativity towards AA", as she was last week. Pt became upset and lightly tearful stating, "I don't want to be labeled a victim, but you all seem to have double standards for people in this group". Pt continued her check in by discussing bx changes she was making including eating lunch by herself and reaching out more for help. She was further confronted by members who stated she "may be at risk of getting sober for other people's motivation". Pt defended herself stating "I may not know why I am getting  sober for myself, but I am interested in finding out this go around". Counselor reflected she was courageous for showing up authentically in session today.   UDS collected: No Results: negative  AA/NA attended?: YesSaturday and Sunday  Sponsor?: Yes   Dorann Lodge, LPCA LCASA 10/09/2017 2:55 PM

## 2017-10-10 ENCOUNTER — Other Ambulatory Visit (HOSPITAL_COMMUNITY): Admitting: Psychology

## 2017-10-10 DIAGNOSIS — G894 Chronic pain syndrome: Secondary | ICD-10-CM

## 2017-10-10 DIAGNOSIS — F1020 Alcohol dependence, uncomplicated: Secondary | ICD-10-CM | POA: Diagnosis not present

## 2017-10-10 DIAGNOSIS — T7432XS Child psychological abuse, confirmed, sequela: Secondary | ICD-10-CM

## 2017-10-11 ENCOUNTER — Encounter (HOSPITAL_COMMUNITY): Payer: Self-pay

## 2017-10-11 ENCOUNTER — Other Ambulatory Visit (HOSPITAL_COMMUNITY): Admitting: Psychology

## 2017-10-11 DIAGNOSIS — F4321 Adjustment disorder with depressed mood: Secondary | ICD-10-CM

## 2017-10-11 DIAGNOSIS — F1020 Alcohol dependence, uncomplicated: Secondary | ICD-10-CM | POA: Diagnosis not present

## 2017-10-11 DIAGNOSIS — F419 Anxiety disorder, unspecified: Secondary | ICD-10-CM

## 2017-10-11 NOTE — Progress Notes (Signed)
    Daily Group Progress Note  Program: CD-IOP   10/11/2017 TINIKA BUCKNAM [id number]  Diagnosis:  Alcohol use disorder, severe, dependence (HCC)  Chronic anxiety  Unresolved grief   Sobriety Date: 10/8  Group Time: 1-2:30  Participation Level: Active  Behavioral Response: Appropriate and Sharing  Type of Therapy: Process Group  Interventions: CBT and Strength-based  Topic: Pts were active and engaged in process session in which pts were encouraged to share about their recovery from mind-altering drugs and alcohol. Pts were encouraged to consider their tx goals of sobriety, social support, and any person-centered goals when checking in.      Group Time: 2:30-4  Participation Level: Active  Behavioral Response: Appropriate and Sharing  Type of Therapy: Psycho-education Group  Interventions: Other: Sleep, Diet, Exercise, Importance of Self Care  Topic: Pts were active and engaged in psychoeducational session with an emphasis on "sleep, diet, exercise, and general self care strategies". Enriqueta Shutter from [facility], led a 1 hour powerpoint presentation on the subject. Pts asked multiple questions and shared openly about their struggles and successes in self care.    Summary: Pt was active and engaged in session. She reported she attended 1 AA meeting since last group. She states she is "feeling positive" and continuing to sustain her behavioral changes. She states she is excited to have her children all under her roof for Thanksgiving, which is the first time since she began drinking alcoholically. Pt talked to her father and visited him in the nursing home. Pt discussed her personal goals of drinking more water throughout the day.    UDS collected: No Results: negative  AA/NA attended?: YesThursday  Sponsor?: Yes  Wes Paislea Hatton LPCA LCASA 10/11/2017 4:55 PM

## 2017-10-15 ENCOUNTER — Other Ambulatory Visit (HOSPITAL_COMMUNITY): Admitting: Psychology

## 2017-10-15 ENCOUNTER — Encounter (HOSPITAL_COMMUNITY): Payer: Self-pay | Admitting: Psychology

## 2017-10-15 DIAGNOSIS — F1020 Alcohol dependence, uncomplicated: Secondary | ICD-10-CM

## 2017-10-15 DIAGNOSIS — Z9884 Bariatric surgery status: Secondary | ICD-10-CM

## 2017-10-15 NOTE — Progress Notes (Signed)
    Daily Group Progress Note  Program: CD-IOP   10/15/2017 Edison Simon [id number]  Diagnosis:  No diagnosis found.   Sobriety Date: 10/8  Group Time: 1-2:30pm  Participation Level: Active  Behavioral Response: Appropriate and Sharing  Type of Therapy: Process Group  Interventions: Supportive  Topic: Process: The first half of group was spent in process. Members shared about their shining moments and any speed bumps they had experienced since we last met. A new group member was present, and she introduced herself and told the group about her struggles with alcoholism. The medical director met with two group members and three drug tests were collected today.  Group Time: 2:30-4pm  Participation Level: Active  Behavioral Response: Sharing  Type of Therapy: Mindfulness exercise/Psycho-education  Interventions: Strength-based  Topic: Mindfulness/Psycho-ed; Values, part 2: the second half of group began with a five-minute mindfulness exercise. It entailed breathing in and saying, 'I am breathing in' and breathing out and saying, "I have breathing out". Members had various responses at the conclusion of the exercise. The remainder of the session was spent in a psycho-ed. The topic was 'Values' and was a second part of the session begun on Monday. Members were asked to identify how they can direct their behaviors to line up with their values. There was good feedback and discussion among the group.  Summary: The patient reported she had attended two meetings since we last met, including a Monday evening meeting where they celebrated 21 years of their history. She also noted she had collected 'lots of phone numbers', and made a call this morning. Her counselor has instructed her to make one call a day to another woman in recovery. The patient has been an isolated drinker and never phoned anybody, when she had thoughts of drinking. The patient reported the mindfulness exercise  brought her back to the 'Centering Prayer' she had learned almost thirty years ago and would do every morning with her husband. When asked to identify something she values, the patient reported her #1 goal is to be a good grandmother, but she also pointed out finding 'balance' and re-inventing herself. Her husband died five years ago and the patient admitted she has been 'shut down' for along time. Finding balance and moving towards self-actualization are both very important to her. The patient was very honest about her life over the past few years and admitted there is a lot of 'stubbornness on my part'. This willingness to examine and explore what that is about will be addressed going forward. The patient responded well to this intervention.   UDS collected: No Results:   AA/NA attended?: Yes, Monday and Wednesday  Sponsor?: Yes   Brandon Melnick, LCAS 10/15/2017 9:26 AM

## 2017-10-16 ENCOUNTER — Telehealth (HOSPITAL_COMMUNITY): Payer: Self-pay | Admitting: Psychology

## 2017-10-16 NOTE — Progress Notes (Signed)
Jasmine Richard is a 61 y.o. female patient. CD-IOP: Individual Counseling Session. Met with the patient this afternoon as scheduled. The patient reported she has invited all of her kids to celebrate thanksgiving on the Sunday after the holiday. Two of the three have responded enthusiastically and she is excited. The patient reported she would make a traditional [place] dinner. She reported she had gotten a sweet email from her daughter saying how much she had enjoyed spending time with her mother and her 59-monthold daughter. The patient reported she is very pleased at the way her children are responding to her and feels she is regaining their trust. She reported she would go to her son-in-law home for thanksgiving on Thursday. The patient reported she is pushing herself to be more outgoing and friendly around people. She admitted she has been 'miserable for five years' or since her husband died suddenly. The patient reported, "I have never reinvented myself and it's scary". The patient admitted she is feeling 'overwhelmed' and we discussed her responsibilities and trying not to take on too much. She is meeting with her sponsor and enjoys talking with her. The patient reported she is reluctant to reach out and call people, but I asked her to call one recovering alcoholic a day. Not to talk at length, necessarily, but to practice calling. We discussed the incident that had occurred in group the other day. The patient reported she is just 'too sensitive' and that she feels 'criticized' and that is uncomfortable. She is in pain having stopped taking any pain medications and reminded me she will have knee replacement on December 3. She is hoping to go to the rehab facility for a couple of weeks directly after surgery since she would rather not be at the house by herself. The patient reported she would do her best to phone one person a day and practice reaching out to others. She feels differently this time and more  committed to sobriety. She is making measurable progress, but with her badly injured knee and the pain that she experiences daily, it makes it hard to be enthusiastic. She remains compliant and we will continue to follow closely in the days ahead. The patient's sobriety date remains 10/8.        ABrandon Melnick LCAS

## 2017-10-17 ENCOUNTER — Other Ambulatory Visit (HOSPITAL_COMMUNITY): Admitting: Psychology

## 2017-10-17 ENCOUNTER — Encounter (HOSPITAL_COMMUNITY): Payer: Self-pay | Admitting: Psychology

## 2017-10-17 DIAGNOSIS — F1020 Alcohol dependence, uncomplicated: Secondary | ICD-10-CM | POA: Diagnosis not present

## 2017-10-17 NOTE — Progress Notes (Signed)
    Daily Group Progress Note  Program: CD-IOP   10/17/2017 Jasmine Richard [id number]  Diagnosis:  No diagnosis found.   Sobriety Date: 09/03/17  Group Time: 1-2:30pm  Participation Level: Active  Behavioral Response: Sharing  Type of Therapy: Process Group  Interventions: Supportive  Topic: Process: The first half of group was spent in process. Members shared about any struggles or challenges they had experienced since we last met. The medical director met with one patient and four random drug tests were collected. Two group members were absent today.  Group Time: 2:30-4pm  Participation Level: Active  Behavioral Response: Sharing  Type of Therapy: Psycho-education Group  Interventions: Strength-based  Topic: Psycho-Education: Relapse Prevention - identifying internal and external triggers. The second half of group was spent in a psycho-ed on relapse prevention. Group members were provided handouts asking them to identify external and internal triggers. While members could easily identify the external triggers, they found it more difficult to identify internal triggers or feelings that compelled them to use. They admitted that in most cases, the used out of habit or ritual versus something actually triggering them to use. The session will continue in group on Wednesday as group members identify plans for the upcoming holiday and strategize how they intend to remain abstinent.   Summary: The patient reported had been clumsy all weekend and wondered if she was still experiencing PAWS. The counseor reminded her that post acute withdrawal symptoms can last over a year. She also admitted she has been 'cranky', but attributed that to the pain she has in her leg and hip. Her brother-in-law and his wife came over and put up her tree. Its an artificial one, but with her current condition, she couldn't possibly have done it herself. The patient reminded the group that her knee  replacement is scheduled for December 3 and she wants to  have her townhouse decorated before she goes into the hospital. The patient expressed disappointment over the loss of her many Christmas decorations. She noted that she had collected 76 nutcrackers, but all were sold in the yard sale her children had at her home when she was still in the rehab facility after breaking her femur in three places. When she was asked about resentments, she admitted she did have a resentment about that. In the psycho-ed, the patient identified certain songs that she associated with her husband as being an external trigger. She said that hearing them made her sad. Her internal triggers include feeling overwhelmed and having to 're-invent myself'. When she drank she experienced a numbness of her emotional pain. It was very effective and reliable, but only made things worse in the long run. The patient was engaged in the session and responded well to this intervention.   UDS collected: Yes Results: pending  AA/NA attended?: [facility] and Saturday  Sponsor?: Yes   Brandon Melnick, LCAS 10/17/2017 9:28 AM

## 2017-10-22 ENCOUNTER — Other Ambulatory Visit (HOSPITAL_COMMUNITY): Admitting: Psychology

## 2017-10-22 ENCOUNTER — Encounter (HOSPITAL_COMMUNITY): Payer: Self-pay | Admitting: Psychology

## 2017-10-22 DIAGNOSIS — F1020 Alcohol dependence, uncomplicated: Secondary | ICD-10-CM

## 2017-10-22 DIAGNOSIS — F4321 Adjustment disorder with depressed mood: Secondary | ICD-10-CM

## 2017-10-22 NOTE — Progress Notes (Signed)
    Daily Group Progress Note  Program: CD-IOP   10/22/2017 Edison Simon [id number]  Diagnosis:  No diagnosis found.   Sobriety Date: 10/8  Group Time: 1-2:30pm  Participation Level: Active  Behavioral Response: Sharing  Type of Therapy: Process Group  Interventions: Supportive  Topic: Process: The first half of group was spent in process. Members shared about their experiences over the past few days and any challenges or struggles they were faced with since we last met. The medical director met with one group member. This part of group also included a review of the plans each member had for the upcoming holiday weekend.   Group Time: 2:30-4pm  Participation Level: Active  Behavioral Response: Sharing  Type of Therapy: Psycho-education Group  Interventions: Strength-based  Topic: Psycho-Ed/Graduation: The second half of group included a psycho-ed on skills to manage emotions. A "distress tolerance handout" was provided and members identified what they did or could do to manage negative emotions. The session concluded with a graduation ceremony. The graduating member's wife, daughter-in-law and brother-in-law appeared for the ceremony. It proved a very touching session with tears and grateful words expressed by the graduating member.  Summary: The patient reported she had gotten an unexpected phone call from son-in-law yesterday. He was ill and her daughter had a migraine. He asked if she would come over and look after her 63 month-old granddaughter? The patient regarded this request as a clear demonstration that she was regaining her childrens' trust and spent most of the day at their house. She was sorry that they were both feeling so poorly, but elated that she would be allowed to look after their daughter. The patient reported she had plans to meet with her sponsor this evening before the 8 pm AA speaker meeting. She shared her holiday plans which include being with lots  of friends and family. The patient agreed that this was going to be a wonderful holiday and admitted that 'last Thanksgiving, I was drunk'. In the psycho-ed, the patient reading books is an activity she uses to distract herself. She noted that she is also giving things away that she doesn't need or at least replacing things, which makes her feel as if she is contributing to the greater good. The patient made some insightful comments and responded well to this intervention.    UDS collected: No Results:   AA/NA attended?: Yes, Monday evening  Sponsor?: Yes   Brandon Melnick, LCAS 10/22/2017 9:05 AM

## 2017-10-23 ENCOUNTER — Encounter (HOSPITAL_COMMUNITY): Payer: Self-pay | Admitting: Psychology

## 2017-10-23 NOTE — Progress Notes (Signed)
Jasmine Richard is a 61 y.o. female patient. CD-IOP: Treatment Plan Update. Met with the patient as scheduled this afternoon. She had had lunch with her mother-in-law and sister-in-law and enjoyed their company. I explained the importance of evaluating her progress on identified treatment goals and the patient reported she felt as if she was making significant progress. I agreed and noted she has remained sober, which is her #1 goal of treatment. Her second goal was to build a network of support and she has secured a sponsor, identified a home group and is attending 4-5 AA meetings per week. She has also followed through on my instructions to phone one woman in recovery every day. The patient's third goal was to repair and rebuild the relationships with her three adult children. They had lost their trust in their mother as she relapsed on at least two occasions after attaining significant sobriety. She has also been in two rehabs over the past two years. Today she reported having a wonderful weekend with them along with spending time yesterday with her daughter and 18 month-old granddaughter. The patient noted their conversations are deeper and more meaningful and beginning to look like their interactions before her drinking became problematic. I realized that the time has passed quickly and that the patient is scheduled for a knee replacement next Monday! She agreed that the time has passed quickly and, in fact, she would have to leave early on Thursday in order to meet with the orthopedic for her pre-surgical review. The patient reported she hoped to be able to return for a couple of weeks upon discharge from hospital and/or rehab facility. She has made good progress and appears to be more determined than in the past about her alcoholism and her desire to be sober. We will continue to follow closely in the days ahead. Her sobriety date remains 10/8.       Brandon Melnick, LCAS

## 2017-10-23 NOTE — Progress Notes (Signed)
    Daily Group Progress Note  Program: CD-IOP   10/23/2017 ABREA HENLE [id number]  Diagnosis:  Alcohol use disorder, severe, dependence (HCC)  Unresolved grief   Sobriety Date: 10/8  Group Time: 1-2:30  Participation Level: Active  Behavioral Response: Appropriate and Sharing  Type of Therapy: Process Group  Interventions: CBT and Supportive  Topic: Pts were active and engaged in session. Pts discussed their long holiday weekend away from CD-IOP. One pt graduated successfully from CD-IOP and had multiple family members present for final 15 min of session. Pts were mostly upbeat and stated that they had positive experiences the last few days. Pts discussed coping skills for distress tolerance and mindfulness to help support their recovery from mind-altering drugs. UDS were collected from some members. One member "forgot" to use UDS cup when she urinated and asked if she could "redo" it tomorrow. Pt was reminded that an unfulfilled UDS was considered a negative result.      Group Time: 2:30-4  Participation Level: Active  Behavioral Response: Appropriate and Sharing  Type of Therapy: Psycho-education Group  Interventions: CBT and Other: Spirituality  Topic: Pts were active and engaged for psychoeducation session w/ a discussion on "control and higher power". Pts listened to 5 min of an audio clip by Duaine Dredge discussing "Letting go of control". Pts were then asked to comment on their experience of trying to control things and how that worked out for them.     Summary: Pt was active and engaged in session. She reported she had a "[place]" thanksgiving break and got "everything she wanted out of it". Pt stated she went to 1 AA meeting since last session and called her sponsor twice. Pt confirmed that she was able to cook for her adult children and was able to keep her grandchildren company during the holiday. Pt was happy to report her adult daughter had "given her the  go ahead" to buy a car seat for her own car, indicating that pt could drive her granddaughter around. Pt stated she was frustrated that her children insisted she continue to use the blow and go in her car despite not drinking in over 30 days. Pt acknowledged that her trust w/ her children is more important that a "minor inconvenience" of the blow and go. Pt was attentive during discussion on spirituality and admitted that "her spirituality and outlook on life has grown since entering CD-IOP".    UDS collected: No Results: negative  AA/NA attended?: YesSaturday  Sponsor?: Yes   Wes Swan, LPCA LCASA 10/23/2017 4:18 PM

## 2017-10-24 ENCOUNTER — Encounter (HOSPITAL_COMMUNITY): Payer: Self-pay | Admitting: Medical

## 2017-10-24 ENCOUNTER — Other Ambulatory Visit (INDEPENDENT_AMBULATORY_CARE_PROVIDER_SITE_OTHER): Admitting: Psychology

## 2017-10-24 DIAGNOSIS — G894 Chronic pain syndrome: Secondary | ICD-10-CM

## 2017-10-24 DIAGNOSIS — F4310 Post-traumatic stress disorder, unspecified: Secondary | ICD-10-CM

## 2017-10-24 DIAGNOSIS — F1020 Alcohol dependence, uncomplicated: Secondary | ICD-10-CM | POA: Diagnosis not present

## 2017-10-24 DIAGNOSIS — T7432XS Child psychological abuse, confirmed, sequela: Secondary | ICD-10-CM

## 2017-10-24 DIAGNOSIS — Z9884 Bariatric surgery status: Secondary | ICD-10-CM

## 2017-10-24 DIAGNOSIS — S72402A Unspecified fracture of lower end of left femur, initial encounter for closed fracture: Secondary | ICD-10-CM

## 2017-10-24 DIAGNOSIS — M0579 Rheumatoid arthritis with rheumatoid factor of multiple sites without organ or systems involvement: Secondary | ICD-10-CM

## 2017-10-24 DIAGNOSIS — F419 Anxiety disorder, unspecified: Secondary | ICD-10-CM

## 2017-10-24 NOTE — Patient Instructions (Addendum)
Jasmine Richard  10/24/2017   Your procedure is scheduled on: 10-29-17  Report to [facility] Main  Entrance  Follow signs to Short Stay on first floor at     0530AM  Call this number if you have problems the morning of surgery   [phone]   Remember:ONCE you are ready for surgery ! ONLY 1 PERSON MAY VISIT  YOU IN SHORT STAY AREA  Do not eat food or drink liquids :After Midnight.     Take these medicines the morning of surgery with A SIP OF WATER: LYRICA, OMEPRAZOLE, LEXAPRO                                You may not have any metal on your body including hair pins and              piercings  Do not wear jewelry, make-up, lotions, powders or perfumes, deodorant             Do not wear nail polish.  Do not shave  48 hours prior to surgery.             Do not bring valuables to the hospital.  IS NOT             RESPONSIBLE   FOR VALUABLES.  Contacts, dentures or bridgework may not be worn into surgery.  Leave suitcase in the car. After surgery it may be brought to your room.               Please read over the following fact sheets you were given: _____________________________________________________________________          [place] - Preparing for Surgery Before surgery, you can play an important role.  Because skin is not sterile, your skin needs to be as free of germs as possible.  You can reduce the number of germs on your skin by washing with CHG (chlorahexidine gluconate) soap before surgery.  CHG is an antiseptic cleaner which kills germs and bonds with the skin to continue killing germs even after washing. Please DO NOT use if you have an allergy to CHG or antibacterial soaps.  If your skin becomes reddened/irritated stop using the CHG and inform your nurse when you arrive at Short Stay. Do not shave (including legs and underarms) for at least 48 hours prior to the first CHG shower.  You may shave your face/neck. Please follow these  instructions carefully:  1.  Shower with CHG Soap the night before surgery and the  morning of Surgery.  2.  If you choose to wash your hair, wash your hair first as usual with your  normal  shampoo.  3.  After you shampoo, rinse your hair and body thoroughly to remove the  shampoo.                           4.  Use CHG as you would any other liquid soap.  You can apply chg directly  to the skin and wash                       Gently with a scrungie or clean washcloth.  5.  Apply the CHG Soap to your body ONLY FROM THE NECK DOWN.   Do  not use on face/ open                           Wound or open sores. Avoid contact with eyes, ears mouth and genitals (private parts).                       Wash face,  Genitals (private parts) with your normal soap.             6.  Wash thoroughly, paying special attention to the area where your surgery  will be performed.  7.  Thoroughly rinse your body with warm water from the neck down.  8.  DO NOT shower/wash with your normal soap after using and rinsing off  the CHG Soap.                9.  Pat yourself dry with a clean towel.            10.  Wear clean pajamas.            11.  Place clean sheets on your bed the night of your first shower and do not  sleep with pets. Day of Surgery : Do not apply any lotions/deodorants the morning of surgery.  Please wear clean clothes to the hospital/surgery center.  FAILURE TO FOLLOW THESE INSTRUCTIONS MAY RESULT IN THE CANCELLATION OF YOUR SURGERY PATIENT SIGNATURE_________________________________  NURSE SIGNATURE__________________________________  ________________________________________________________________________  WHAT IS A BLOOD TRANSFUSION? Blood Transfusion Information  A transfusion is the replacement of blood or some of its parts. Blood is made up of multiple cells which provide different functions.  Red blood cells carry oxygen and are used for blood loss replacement.  White blood cells fight against  infection.  Platelets control bleeding.  Plasma helps clot blood.  Other blood products are available for specialized needs, such as hemophilia or other clotting disorders. BEFORE THE TRANSFUSION  Who gives blood for transfusions?   Healthy volunteers who are fully evaluated to make sure their blood is safe. This is blood bank blood. Transfusion therapy is the safest it has ever been in the practice of medicine. Before blood is taken from a donor, a complete history is taken to make sure that person has no history of diseases nor engages in risky social behavior (examples are intravenous drug use or sexual activity with multiple partners). The donor's travel history is screened to minimize risk of transmitting infections, such as malaria. The donated blood is tested for signs of infectious diseases, such as HIV and hepatitis. The blood is then tested to be sure it is compatible with you in order to minimize the chance of a transfusion reaction. If you or a relative donates blood, this is often done in anticipation of surgery and is not appropriate for emergency situations. It takes many days to process the donated blood. RISKS AND COMPLICATIONS Although transfusion therapy is very safe and saves many lives, the main dangers of transfusion include:   Getting an infectious disease.  Developing a transfusion reaction. This is an allergic reaction to something in the blood you were given. Every precaution is taken to prevent this. The decision to have a blood transfusion has been considered carefully by your caregiver before blood is given. Blood is not given unless the benefits outweigh the risks. AFTER THE TRANSFUSION  Right after receiving a blood transfusion, you will usually feel much better and more energetic. This is especially true  if your red blood cells have gotten low (anemic). The transfusion raises the level of the red blood cells which carry oxygen, and this usually causes an energy  increase.  The nurse administering the transfusion will monitor you carefully for complications. HOME CARE INSTRUCTIONS  No special instructions are needed after a transfusion. You may find your energy is better. Speak with your caregiver about any limitations on activity for underlying diseases you may have. SEEK MEDICAL CARE IF:   Your condition is not improving after your transfusion.  You develop redness or irritation at the intravenous (IV) site. SEEK IMMEDIATE MEDICAL CARE IF:  Any of the following symptoms occur over the next 12 hours:  Shaking chills.  You have a temperature by mouth above 102 F (38.9 C), not controlled by medicine.  Chest, back, or muscle pain.  People around you feel you are not acting correctly or are confused.  Shortness of breath or difficulty breathing.  Dizziness and fainting.  You get a rash or develop hives.  You have a decrease in urine output.  Your urine turns a dark color or changes to pink, red, or brown. Any of the following symptoms occur over the next 10 days:  You have a temperature by mouth above 102 F (38.9 C), not controlled by medicine.  Shortness of breath.  Weakness after normal activity.  The white part of the eye turns yellow (jaundice).  You have a decrease in the amount of urine or are urinating less often.  Your urine turns a dark color or changes to pink, red, or brown. Document Released: 11/10/2000 Document Revised: 02/05/2012 Document Reviewed: 06/29/2008 ExitCare Patient Information 2014 [place].  _______________________________________________________________________  Incentive Spirometer  An incentive spirometer is a tool that can help keep your lungs clear and active. This tool measures how well you are filling your lungs with each breath. Taking long deep breaths may help reverse or decrease the chance of developing breathing (pulmonary) problems (especially infection) following:  A long  period of time when you are unable to move or be active. BEFORE THE PROCEDURE   If the spirometer includes an indicator to show your best effort, your nurse or respiratory therapist will set it to a desired goal.  If possible, sit up straight or lean slightly forward. Try not to slouch.  Hold the incentive spirometer in an upright position. INSTRUCTIONS FOR USE  1. Sit on the edge of your bed if possible, or sit up as far as you can in bed or on a chair. 2. Hold the incentive spirometer in an upright position. 3. Breathe out normally. 4. Place the mouthpiece in your mouth and seal your lips tightly around it. 5. Breathe in slowly and as deeply as possible, raising the piston or the ball toward the top of the column. 6. Hold your breath for 3-5 seconds or for as long as possible. Allow the piston or ball to fall to the bottom of the column. 7. Remove the mouthpiece from your mouth and breathe out normally. 8. Rest for a few seconds and repeat Steps 1 through 7 at least 10 times every 1-2 hours when you are awake. Take your time and take a few normal breaths between deep breaths. 9. The spirometer may include an indicator to show your best effort. Use the indicator as a goal to work toward during each repetition. 10. After each set of 10 deep breaths, practice coughing to be sure your lungs are clear. If you have an  incision (the cut made at the time of surgery), support your incision when coughing by placing a pillow or rolled up towels firmly against it. Once you are able to get out of bed, walk around indoors and cough well. You may stop using the incentive spirometer when instructed by your caregiver.  RISKS AND COMPLICATIONS  Take your time so you do not get dizzy or light-headed.  If you are in pain, you may need to take or ask for pain medication before doing incentive spirometry. It is harder to take a deep breath if you are having pain. AFTER USE  Rest and breathe slowly and  easily.  It can be helpful to keep track of a log of your progress. Your caregiver can provide you with a simple table to help with this. If you are using the spirometer at home, follow these instructions: [place] IF:   You are having difficultly using the spirometer.  You have trouble using the spirometer as often as instructed.  Your pain medication is not giving enough relief while using the spirometer.  You develop fever of 100.5 F (38.1 C) or higher. SEEK IMMEDIATE MEDICAL CARE IF:   You cough up bloody sputum that had not been present before.  You develop fever of 102 F (38.9 C) or greater.  You develop worsening pain at or near the incision site. MAKE SURE YOU:   Understand these instructions.  Will watch your condition.  Will get help right away if you are not doing well or get worse. Document Released: 03/26/2007 Document Revised: 02/05/2012 Document Reviewed: 05/27/2007 [facility] Patient Information 2014 [place], [place].   ________________________________________________________________________

## 2017-10-24 NOTE — Progress Notes (Signed)
EKG 07-31-17 Epic  ECHO 02-15-17 EPIC

## 2017-10-24 NOTE — Progress Notes (Signed)
Patient ID: Jasmine Richard, female   DOB: August 23, 1956, 61 y.o.   MRN: [id number]    S Met with patient prior to taking leave from [place] for LT TKR Dec 3 (Monday) to discuss opiate management post op.There were concerns about her care -whether she could return to assisted care facility but as fate would have it she has a friend who had been away who is a PA and is returning with no place to live immediately and who is willing to stay with her. She was under the impression that she would only need assistance for a few days but agrees to ask friend to help for 2 weeks. She has taken opiates without difficulty so far-only needed them for a few days after hip repair but a TKR is notorious for much longer period of discomfort and rehabilitation.She wants to return to [facility] to complete course of treatment after surgery. She reports that her grief has resolved  After what she calls an "epiphany" over a 2 day period where she realized that she was "that" person in the past and now she needed to figure out who she is going forward !!!  O:  Mental Status Examination   Appearance: Neat/Casually dressed Alert: Yes Attention: Good Cooperative: Yes Eye Contact: Good Speech: normal in volume, rate, tone, spontaneous Psychomotor Activity: Normal Memory/Concentration: OK Oriented: person, place,time and situation Mood: Euthymic Affect: Congruent and Full Range Thought Processes and Associations: Goal Directed and Intact Fund of Knowledge: Fair to good depending on topic Thought Content: NO Suicidal ideation, Homicidal ideation, Auditory hallucinations, Visual hallucinations, Delusions and Paranoia,  Language: Good Insight: Improving Judgement: Improving  Musculoskeletal: Strength & Muscle Tone: Lt leg/knee weaker Uses cane to ambulate Gait & Station: abnormal-favors lt leg hip and knee=uses cane as noted Patient leans: on cane(LT)  A Remarkable breakthru in her grief process     LT TKR  Monday   P-Will place on Medical Leave for TKR      Discussed risks of Opiate use in context of alcoholism including development of addiction to opiates and /or            return to alcohol/relapse and means of reducing risk      Counselor advised to reinforce risk reduction Thursday      Encouraged her to continue enjoying recovery-she reported she has decided to purchase a new sewing                Machine that caught her attention that apparently is somewhat pricey saying "You cant take it with you"

## 2017-10-25 ENCOUNTER — Other Ambulatory Visit: Payer: Self-pay

## 2017-10-25 ENCOUNTER — Encounter (HOSPITAL_COMMUNITY): Payer: Self-pay

## 2017-10-25 ENCOUNTER — Encounter (HOSPITAL_COMMUNITY)
Admission: RE | Admit: 2017-10-25 | Discharge: 2017-10-25 | Disposition: A | Discharge status: Home / Self Care | Source: Ambulatory Visit | Attending: Orthopedic Surgery | Admitting: Orthopedic Surgery

## 2017-10-25 ENCOUNTER — Other Ambulatory Visit (HOSPITAL_COMMUNITY)

## 2017-10-25 DIAGNOSIS — M1712 Unilateral primary osteoarthritis, left knee: Secondary | ICD-10-CM | POA: Diagnosis not present

## 2017-10-25 DIAGNOSIS — Z01812 Encounter for preprocedural laboratory examination: Secondary | ICD-10-CM | POA: Insufficient documentation

## 2017-10-25 HISTORY — DX: Family history of other specified conditions: Z84.89

## 2017-10-25 HISTORY — DX: Unspecified urinary incontinence: R32

## 2017-10-25 LAB — CBC
HEMATOCRIT: 38 % (ref 36.0–46.0)
HEMOGLOBIN: 13.3 g/dL (ref 12.0–15.0)
MCH: 34.9 pg — AB (ref 26.0–34.0)
MCHC: 35 g/dL (ref 30.0–36.0)
MCV: 99.7 fL (ref 78.0–100.0)
Platelets: 190 10*3/uL (ref 150–400)
RBC: 3.81 MIL/uL — AB (ref 3.87–5.11)
RDW: 14.3 % (ref 11.5–15.5)
WBC: 3.4 10*3/uL — ABNORMAL LOW (ref 4.0–10.5)

## 2017-10-25 LAB — SURGICAL PCR SCREEN
MRSA, PCR: NEGATIVE
STAPHYLOCOCCUS AUREUS: NEGATIVE

## 2017-10-25 LAB — BASIC METABOLIC PANEL
ANION GAP: 4 — AB (ref 5–15)
BUN: 8 mg/dL (ref 6–20)
CO2: 28 mmol/L (ref 22–32)
Calcium: 9.1 mg/dL (ref 8.9–10.3)
Chloride: 106 mmol/L (ref 101–111)
Creatinine, Ser: 0.62 mg/dL (ref 0.44–1.00)
GFR calc Af Amer: >60 mL/min (ref >60)
GFR calc non Af Amer: >60 mL/min (ref >60)
GLUCOSE: 96 mg/dL (ref 65–99)
POTASSIUM: 4.4 mmol/L (ref 3.5–5.1)
Sodium: 138 mmol/L (ref 135–145)

## 2017-10-25 LAB — ABO/RH: ABO/RH(D): O POS
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
## 2017-10-25 NOTE — Progress Notes (Signed)
Spoke with Jasmine Richard  With Dr. Charlann Boxer. Patient complained of head congestion and sore throat that started 10-25-17.no fever at preop.instructed pt. To see her PCP to be evaluated because her surgery is on 10-29-17
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
## 2017-10-29 ENCOUNTER — Ambulatory Visit (HOSPITAL_COMMUNITY): Admitting: Certified Registered Nurse Anesthetist

## 2017-10-29 ENCOUNTER — Observation Stay (HOSPITAL_COMMUNITY)
Admission: AC | Admit: 2017-10-29 | Discharge: 2017-10-30 | Disposition: A | Discharge status: Home / Self Care | Source: Ambulatory Visit | Attending: Orthopedic Surgery | Admitting: Orthopedic Surgery

## 2017-10-29 ENCOUNTER — Encounter (HOSPITAL_COMMUNITY): Payer: Self-pay | Admitting: *Deleted

## 2017-10-29 ENCOUNTER — Encounter (HOSPITAL_COMMUNITY)
Admission: AC | Disposition: A | Discharge status: Home / Self Care | Payer: Self-pay | Source: Ambulatory Visit | Attending: Orthopedic Surgery

## 2017-10-29 ENCOUNTER — Other Ambulatory Visit: Payer: Self-pay

## 2017-10-29 ENCOUNTER — Other Ambulatory Visit (HOSPITAL_COMMUNITY)

## 2017-10-29 DIAGNOSIS — Z79899 Other long term (current) drug therapy: Secondary | ICD-10-CM | POA: Insufficient documentation

## 2017-10-29 DIAGNOSIS — Z9889 Other specified postprocedural states: Secondary | ICD-10-CM | POA: Diagnosis not present

## 2017-10-29 DIAGNOSIS — L4050 Arthropathic psoriasis, unspecified: Secondary | ICD-10-CM | POA: Diagnosis not present

## 2017-10-29 DIAGNOSIS — F1020 Alcohol dependence, uncomplicated: Secondary | ICD-10-CM | POA: Insufficient documentation

## 2017-10-29 DIAGNOSIS — G894 Chronic pain syndrome: Secondary | ICD-10-CM | POA: Insufficient documentation

## 2017-10-29 DIAGNOSIS — F4323 Adjustment disorder with mixed anxiety and depressed mood: Secondary | ICD-10-CM | POA: Diagnosis not present

## 2017-10-29 DIAGNOSIS — Z9884 Bariatric surgery status: Secondary | ICD-10-CM | POA: Insufficient documentation

## 2017-10-29 DIAGNOSIS — I350 Nonrheumatic aortic (valve) stenosis: Secondary | ICD-10-CM | POA: Insufficient documentation

## 2017-10-29 DIAGNOSIS — Z9049 Acquired absence of other specified parts of digestive tract: Secondary | ICD-10-CM | POA: Diagnosis not present

## 2017-10-29 DIAGNOSIS — F4310 Post-traumatic stress disorder, unspecified: Secondary | ICD-10-CM | POA: Insufficient documentation

## 2017-10-29 DIAGNOSIS — G43101 Migraine with aura, not intractable, with status migrainosus: Secondary | ICD-10-CM | POA: Diagnosis not present

## 2017-10-29 DIAGNOSIS — M25762 Osteophyte, left knee: Secondary | ICD-10-CM | POA: Insufficient documentation

## 2017-10-29 DIAGNOSIS — M170 Bilateral primary osteoarthritis of knee: Principal | ICD-10-CM | POA: Insufficient documentation

## 2017-10-29 DIAGNOSIS — D509 Iron deficiency anemia, unspecified: Secondary | ICD-10-CM | POA: Diagnosis not present

## 2017-10-29 DIAGNOSIS — G4700 Insomnia, unspecified: Secondary | ICD-10-CM | POA: Insufficient documentation

## 2017-10-29 DIAGNOSIS — K219 Gastro-esophageal reflux disease without esophagitis: Secondary | ICD-10-CM | POA: Diagnosis not present

## 2017-10-29 DIAGNOSIS — M069 Rheumatoid arthritis, unspecified: Secondary | ICD-10-CM | POA: Diagnosis not present

## 2017-10-29 DIAGNOSIS — Z791 Long term (current) use of non-steroidal anti-inflammatories (NSAID): Secondary | ICD-10-CM | POA: Diagnosis not present

## 2017-10-29 DIAGNOSIS — Z96659 Presence of unspecified artificial knee joint: Secondary | ICD-10-CM

## 2017-10-29 DIAGNOSIS — Z96652 Presence of left artificial knee joint: Secondary | ICD-10-CM

## 2017-10-29 HISTORY — PX: INJECTION KNEE: SHX2446

## 2017-10-29 HISTORY — PX: TOTAL KNEE ARTHROPLASTY: SHX125

## 2017-10-29 LAB — TYPE AND SCREEN
ABO/RH(D): O POS
ANTIBODY SCREEN: NEGATIVE

## 2017-10-29 SURGERY — ARTHROPLASTY, KNEE, TOTAL
Anesthesia: Spinal | Site: Knee | Laterality: Right

## 2017-10-29 MED ORDER — KETOROLAC TROMETHAMINE 30 MG/ML IJ SOLN
INTRAMUSCULAR | Status: DC | PRN
  Administered 2017-10-29: 30 mg

## 2017-10-29 MED ORDER — ONDANSETRON HCL 4 MG/2ML IJ SOLN
INTRAMUSCULAR | Status: DC | PRN
  Administered 2017-10-29: 4 mg via INTRAVENOUS

## 2017-10-29 MED ORDER — BUPIVACAINE HCL (PF) 0.75 % IJ SOLN
INTRAMUSCULAR | Status: DC | PRN
  Administered 2017-10-29: 1.8 mL via INTRATHECAL

## 2017-10-29 MED ORDER — SODIUM CHLORIDE 0.9 % IR SOLN
Status: DC | PRN
  Administered 2017-10-29: 1000 mL

## 2017-10-29 MED ORDER — POLYETHYLENE GLYCOL 3350 17 G PO PACK
17.0000 g | PACK | Freq: Two times a day (BID) | ORAL | Status: DC
  Filled 2017-10-29 (×2): qty 1

## 2017-10-29 MED ORDER — DEXAMETHASONE SODIUM PHOSPHATE 10 MG/ML IJ SOLN
10.0000 mg | Freq: Once | INTRAMUSCULAR | Status: AC
  Administered 2017-10-30: 10 mg via INTRAVENOUS
  Filled 2017-10-29: qty 1

## 2017-10-29 MED ORDER — PHENYLEPHRINE 40 MCG/ML (10ML) SYRINGE FOR IV PUSH (FOR BLOOD PRESSURE SUPPORT)
PREFILLED_SYRINGE | INTRAVENOUS | Status: DC | PRN
  Administered 2017-10-29 (×2): 80 ug via INTRAVENOUS

## 2017-10-29 MED ORDER — SODIUM CHLORIDE 0.9 % IV SOLN
INTRAVENOUS | Status: DC
  Administered 2017-10-29 – 2017-10-30 (×2): via INTRAVENOUS

## 2017-10-29 MED ORDER — LIDOCAINE 2% (20 MG/ML) 5 ML SYRINGE
INTRAMUSCULAR | Status: AC
  Filled 2017-10-29: qty 5

## 2017-10-29 MED ORDER — HYDROCODONE-ACETAMINOPHEN 7.5-325 MG PO TABS: 1.0000 | ORAL_TABLET | ORAL | 0 refills | Status: DC | PRN

## 2017-10-29 MED ORDER — TRAZODONE HCL 100 MG PO TABS: 200.0000 mg | ORAL_TABLET | Freq: Every day | ORAL | Status: DC

## 2017-10-29 MED ORDER — DEXTROSE 5 % IV SOLN
INTRAVENOUS | Status: DC | PRN
  Administered 2017-10-29: 50 ug/min via INTRAVENOUS

## 2017-10-29 MED ORDER — KETOROLAC TROMETHAMINE 30 MG/ML IJ SOLN: 30.0000 mg | Freq: Once | INTRAMUSCULAR | Status: DC | PRN

## 2017-10-29 MED ORDER — HYDROMORPHONE HCL 1 MG/ML IJ SOLN: 0.2500 mg | INTRAMUSCULAR | Status: DC | PRN

## 2017-10-29 MED ORDER — SUMATRIPTAN SUCCINATE 50 MG PO TABS
100.0000 mg | ORAL_TABLET | ORAL | Status: DC | PRN
  Filled 2017-10-29: qty 2

## 2017-10-29 MED ORDER — DOCUSATE SODIUM 100 MG PO CAPS: 100.0000 mg | ORAL_CAPSULE | Freq: Two times a day (BID) | ORAL | 0 refills | Status: AC

## 2017-10-29 MED ORDER — MENTHOL 3 MG MT LOZG: 1.0000 | LOZENGE | OROMUCOSAL | Status: DC | PRN

## 2017-10-29 MED ORDER — NON FORMULARY
20.0000 mg | Freq: Two times a day (BID) | Status: DC
Start: 2017-10-29 — End: 2017-10-29

## 2017-10-29 MED ORDER — MIDAZOLAM HCL 2 MG/2ML IJ SOLN
INTRAMUSCULAR | Status: AC
  Filled 2017-10-29: qty 2

## 2017-10-29 MED ORDER — LACTATED RINGERS IV SOLN
INTRAVENOUS | Status: DC | PRN
  Administered 2017-10-29 (×2): via INTRAVENOUS

## 2017-10-29 MED ORDER — FERROUS SULFATE 325 (65 FE) MG PO TABS
325.0000 mg | ORAL_TABLET | Freq: Three times a day (TID) | ORAL | Status: DC
  Administered 2017-10-29 – 2017-10-30 (×3): 325 mg via ORAL
  Filled 2017-10-29 (×3): qty 1

## 2017-10-29 MED ORDER — BUPIVACAINE-EPINEPHRINE (PF) 0.5% -1:200000 IJ SOLN
INTRAMUSCULAR | Status: DC | PRN
  Administered 2017-10-29 (×6): 5 mL via PERINEURAL

## 2017-10-29 MED ORDER — FENTANYL CITRATE (PF) 100 MCG/2ML IJ SOLN
INTRAMUSCULAR | Status: DC | PRN
  Administered 2017-10-29: 25 ug via INTRAVENOUS
  Administered 2017-10-29: 50 ug via INTRAVENOUS
  Administered 2017-10-29: 25 ug via INTRAVENOUS

## 2017-10-29 MED ORDER — LIDOCAINE HCL (PF) 1 % IJ SOLN
INTRAMUSCULAR | Status: AC
  Filled 2017-10-29: qty 30

## 2017-10-29 MED ORDER — NALTREXONE HCL 50 MG PO TABS
50.0000 mg | ORAL_TABLET | Freq: Every day | ORAL | Status: DC
  Administered 2017-10-29: 50 mg via ORAL
  Filled 2017-10-29: qty 1

## 2017-10-29 MED ORDER — LIDOCAINE-EPINEPHRINE (PF) 1 %-1:200000 IJ SOLN
INTRAMUSCULAR | Status: AC
  Filled 2017-10-29: qty 30

## 2017-10-29 MED ORDER — CELECOXIB 200 MG PO CAPS
200.0000 mg | ORAL_CAPSULE | Freq: Two times a day (BID) | ORAL | Status: DC
  Administered 2017-10-29 – 2017-10-30 (×2): 200 mg via ORAL
  Filled 2017-10-29 (×2): qty 1

## 2017-10-29 MED ORDER — PHENYLEPHRINE 40 MCG/ML (10ML) SYRINGE FOR IV PUSH (FOR BLOOD PRESSURE SUPPORT)
PREFILLED_SYRINGE | INTRAVENOUS | Status: AC
  Filled 2017-10-29: qty 10

## 2017-10-29 MED ORDER — CEFAZOLIN SODIUM-DEXTROSE 2-4 GM/100ML-% IV SOLN
2.0000 g | INTRAVENOUS | Status: AC
  Administered 2017-10-29: 2 g via INTRAVENOUS

## 2017-10-29 MED ORDER — ASPIRIN 81 MG PO CHEW: 81.0000 mg | CHEWABLE_TABLET | Freq: Two times a day (BID) | ORAL | 0 refills | Status: AC

## 2017-10-29 MED ORDER — TRAZODONE HCL 100 MG PO TABS
200.0000 mg | ORAL_TABLET | Freq: Every day | ORAL | Status: DC
  Administered 2017-10-30: 100 mg via ORAL
  Filled 2017-10-29 (×2): qty 2

## 2017-10-29 MED ORDER — PREGABALIN 75 MG PO CAPS
150.0000 mg | ORAL_CAPSULE | Freq: Three times a day (TID) | ORAL | Status: DC
  Administered 2017-10-29 – 2017-10-30 (×3): 150 mg via ORAL
  Filled 2017-10-29 (×3): qty 2

## 2017-10-29 MED ORDER — ALUM & MAG HYDROXIDE-SIMETH 200-200-20 MG/5ML PO SUSP: 15.0000 mL | ORAL | Status: DC | PRN

## 2017-10-29 MED ORDER — METOCLOPRAMIDE HCL 5 MG/ML IJ SOLN: 5.0000 mg | Freq: Three times a day (TID) | INTRAMUSCULAR | Status: DC | PRN

## 2017-10-29 MED ORDER — DEXAMETHASONE SODIUM PHOSPHATE 10 MG/ML IJ SOLN
10.0000 mg | Freq: Once | INTRAMUSCULAR | Status: AC
  Administered 2017-10-29: 10 mg via INTRAVENOUS

## 2017-10-29 MED ORDER — BISACODYL 10 MG RE SUPP: 10.0000 mg | Freq: Every day | RECTAL | Status: DC | PRN

## 2017-10-29 MED ORDER — ONDANSETRON HCL 4 MG PO TABS: 4.0000 mg | ORAL_TABLET | Freq: Four times a day (QID) | ORAL | Status: DC | PRN

## 2017-10-29 MED ORDER — METHYLPREDNISOLONE ACETATE 40 MG/ML IJ SUSP
INTRAMUSCULAR | Status: AC
  Filled 2017-10-29: qty 2

## 2017-10-29 MED ORDER — 0.9 % SODIUM CHLORIDE (POUR BTL) OPTIME
TOPICAL | Status: DC | PRN
  Administered 2017-10-29: 1000 mL

## 2017-10-29 MED ORDER — PROPOFOL 500 MG/50ML IV EMUL
INTRAVENOUS | Status: DC | PRN
  Administered 2017-10-29: 100 ug/kg/min via INTRAVENOUS

## 2017-10-29 MED ORDER — LIDOCAINE HCL (PF) 1 % IJ SOLN
INTRAMUSCULAR | Status: DC | PRN
  Administered 2017-10-29: 6 mL

## 2017-10-29 MED ORDER — METHOCARBAMOL 1000 MG/10ML IJ SOLN
500.0000 mg | Freq: Four times a day (QID) | INTRAVENOUS | Status: DC | PRN
  Administered 2017-10-29: 500 mg via INTRAVENOUS
  Filled 2017-10-29: qty 550

## 2017-10-29 MED ORDER — MIDAZOLAM HCL 5 MG/5ML IJ SOLN
INTRAMUSCULAR | Status: DC | PRN
  Administered 2017-10-29 (×2): 1 mg via INTRAVENOUS

## 2017-10-29 MED ORDER — PHENOL 1.4 % MT LIQD
1.0000 | OROMUCOSAL | Status: DC | PRN
  Filled 2017-10-29: qty 177

## 2017-10-29 MED ORDER — HYDROCODONE-ACETAMINOPHEN 7.5-325 MG PO TABS
1.0000 | ORAL_TABLET | ORAL | Status: DC | PRN
  Administered 2017-10-29: 1 via ORAL
  Filled 2017-10-29: qty 1

## 2017-10-29 MED ORDER — PROPOFOL 10 MG/ML IV BOLUS
INTRAVENOUS | Status: AC
  Filled 2017-10-29: qty 20

## 2017-10-29 MED ORDER — ASPIRIN 81 MG PO CHEW
81.0000 mg | CHEWABLE_TABLET | Freq: Two times a day (BID) | ORAL | Status: DC
  Administered 2017-10-29 – 2017-10-30 (×2): 81 mg via ORAL
  Filled 2017-10-29 (×2): qty 1

## 2017-10-29 MED ORDER — POLYETHYLENE GLYCOL 3350 17 G PO PACK: 17.0000 g | PACK | Freq: Two times a day (BID) | ORAL | 0 refills | Status: AC

## 2017-10-29 MED ORDER — ONDANSETRON HCL 4 MG/2ML IJ SOLN: 4.0000 mg | Freq: Four times a day (QID) | INTRAMUSCULAR | Status: DC | PRN

## 2017-10-29 MED ORDER — DIPHENHYDRAMINE HCL 12.5 MG/5ML PO ELIX: 12.5000 mg | ORAL_SOLUTION | ORAL | Status: DC | PRN

## 2017-10-29 MED ORDER — LORATADINE 10 MG PO TABS
10.0000 mg | ORAL_TABLET | Freq: Every day | ORAL | Status: DC
  Administered 2017-10-29 – 2017-10-30 (×2): 10 mg via ORAL
  Filled 2017-10-29 (×2): qty 1

## 2017-10-29 MED ORDER — METHOCARBAMOL 500 MG PO TABS
500.0000 mg | ORAL_TABLET | Freq: Four times a day (QID) | ORAL | Status: DC | PRN
  Administered 2017-10-30 (×2): 500 mg via ORAL
  Filled 2017-10-29 (×2): qty 1

## 2017-10-29 MED ORDER — PROMETHAZINE HCL 25 MG/ML IJ SOLN: 6.2500 mg | INTRAMUSCULAR | Status: DC | PRN

## 2017-10-29 MED ORDER — METHOCARBAMOL 500 MG PO TABS: 500.0000 mg | ORAL_TABLET | Freq: Four times a day (QID) | ORAL | 0 refills | Status: AC | PRN

## 2017-10-29 MED ORDER — FERROUS SULFATE 325 (65 FE) MG PO TABS: 325.0000 mg | ORAL_TABLET | Freq: Three times a day (TID) | ORAL | 3 refills | Status: AC

## 2017-10-29 MED ORDER — HYDROMORPHONE HCL 1 MG/ML IJ SOLN: 0.5000 mg | INTRAMUSCULAR | Status: DC | PRN

## 2017-10-29 MED ORDER — KETOROLAC TROMETHAMINE 30 MG/ML IJ SOLN
INTRAMUSCULAR | Status: AC
  Filled 2017-10-29: qty 1

## 2017-10-29 MED ORDER — STERILE WATER FOR IRRIGATION IR SOLN
Status: DC | PRN
  Administered 2017-10-29: 2000 mL

## 2017-10-29 MED ORDER — FENTANYL CITRATE (PF) 100 MCG/2ML IJ SOLN
INTRAMUSCULAR | Status: AC
  Filled 2017-10-29: qty 2

## 2017-10-29 MED ORDER — BUPIVACAINE-EPINEPHRINE (PF) 0.25% -1:200000 IJ SOLN
INTRAMUSCULAR | Status: DC | PRN
  Administered 2017-10-29: 30 mL

## 2017-10-29 MED ORDER — MAGNESIUM CITRATE PO SOLN: 1.0000 | Freq: Once | ORAL | Status: DC | PRN

## 2017-10-29 MED ORDER — OMEPRAZOLE 20 MG PO CPDR
20.0000 mg | DELAYED_RELEASE_CAPSULE | Freq: Two times a day (BID) | ORAL | Status: DC
  Administered 2017-10-29 – 2017-10-30 (×2): 20 mg via ORAL
  Filled 2017-10-29 (×2): qty 1

## 2017-10-29 MED ORDER — SODIUM CHLORIDE 0.9 % IJ SOLN
INTRAMUSCULAR | Status: DC | PRN
  Administered 2017-10-29: 29 mL

## 2017-10-29 MED ORDER — PROPOFOL 10 MG/ML IV BOLUS
INTRAVENOUS | Status: AC
  Filled 2017-10-29: qty 40

## 2017-10-29 MED ORDER — MEPERIDINE HCL 50 MG/ML IJ SOLN: 6.2500 mg | INTRAMUSCULAR | Status: DC | PRN

## 2017-10-29 MED ORDER — DEXAMETHASONE SODIUM PHOSPHATE 10 MG/ML IJ SOLN
INTRAMUSCULAR | Status: AC
  Filled 2017-10-29: qty 1

## 2017-10-29 MED ORDER — ACETAMINOPHEN 325 MG PO TABS: 650.0000 mg | ORAL_TABLET | ORAL | Status: DC | PRN

## 2017-10-29 MED ORDER — CEFAZOLIN SODIUM-DEXTROSE 2-4 GM/100ML-% IV SOLN
2.0000 g | Freq: Four times a day (QID) | INTRAVENOUS | Status: AC
  Administered 2017-10-29 (×2): 2 g via INTRAVENOUS
  Filled 2017-10-29 (×2): qty 100

## 2017-10-29 MED ORDER — BUPIVACAINE-EPINEPHRINE (PF) 0.25% -1:200000 IJ SOLN
INTRAMUSCULAR | Status: AC
  Filled 2017-10-29: qty 30

## 2017-10-29 MED ORDER — HYDROCODONE-ACETAMINOPHEN 7.5-325 MG PO TABS
2.0000 | ORAL_TABLET | ORAL | Status: DC | PRN
  Administered 2017-10-29 – 2017-10-30 (×4): 2 via ORAL
  Filled 2017-10-29 (×4): qty 2

## 2017-10-29 MED ORDER — ACETAMINOPHEN 650 MG RE SUPP
650.0000 mg | RECTAL | Status: DC | PRN
Start: 2017-10-29 — End: 2017-10-30

## 2017-10-29 MED ORDER — METHYLPREDNISOLONE ACETATE 80 MG/ML IJ SUSP
INTRAMUSCULAR | Status: DC | PRN
  Administered 2017-10-29: 80 mg

## 2017-10-29 MED ORDER — TRANEXAMIC ACID 1000 MG/10ML IV SOLN
1000.0000 mg | INTRAVENOUS | Status: AC
  Administered 2017-10-29: 1000 mg via INTRAVENOUS
  Filled 2017-10-29: qty 1100

## 2017-10-29 MED ORDER — VALACYCLOVIR HCL 500 MG PO TABS
2000.0000 mg | ORAL_TABLET | Freq: Once | ORAL | Status: DC | PRN
  Filled 2017-10-29: qty 4

## 2017-10-29 MED ORDER — ESCITALOPRAM OXALATE 20 MG PO TABS
20.0000 mg | ORAL_TABLET | Freq: Every day | ORAL | Status: DC
  Administered 2017-10-30: 20 mg via ORAL
  Filled 2017-10-29: qty 1

## 2017-10-29 MED ORDER — CHLORHEXIDINE GLUCONATE 4 % EX LIQD: 60.0000 mL | Freq: Once | CUTANEOUS | Status: DC

## 2017-10-29 MED ORDER — DOCUSATE SODIUM 100 MG PO CAPS
100.0000 mg | ORAL_CAPSULE | Freq: Two times a day (BID) | ORAL | Status: DC
  Administered 2017-10-29: 22:00:00 100 mg via ORAL
  Filled 2017-10-29 (×2): qty 1

## 2017-10-29 MED ORDER — TRANEXAMIC ACID 1000 MG/10ML IV SOLN
1000.0000 mg | Freq: Once | INTRAVENOUS | Status: AC
  Administered 2017-10-29: 13:00:00 1000 mg via INTRAVENOUS
  Filled 2017-10-29: qty 1100

## 2017-10-29 MED ORDER — ONDANSETRON HCL 4 MG/2ML IJ SOLN
INTRAMUSCULAR | Status: AC
  Filled 2017-10-29: qty 2

## 2017-10-29 MED ORDER — OXYBUTYNIN CHLORIDE ER 5 MG PO TB24
5.0000 mg | ORAL_TABLET | Freq: Every day | ORAL | Status: DC
  Administered 2017-10-29: 5 mg via ORAL
  Filled 2017-10-29: qty 1

## 2017-10-29 MED ORDER — TRAZODONE HCL 100 MG PO TABS: 100.0000 mg | ORAL_TABLET | Freq: Every evening | ORAL | Status: DC | PRN

## 2017-10-29 MED ORDER — CEFAZOLIN SODIUM-DEXTROSE 2-4 GM/100ML-% IV SOLN
INTRAVENOUS | Status: AC
Start: 2017-10-29 — End: 2017-10-29
  Filled 2017-10-29: qty 100

## 2017-10-29 MED ORDER — SODIUM CHLORIDE 0.9 % IJ SOLN
INTRAMUSCULAR | Status: AC
  Filled 2017-10-29: qty 50

## 2017-10-29 MED ORDER — METOCLOPRAMIDE HCL 5 MG PO TABS: 5.0000 mg | ORAL_TABLET | Freq: Three times a day (TID) | ORAL | Status: DC | PRN

## 2017-10-29 MED ORDER — PHENYLEPHRINE HCL 10 MG/ML IJ SOLN
INTRAMUSCULAR | Status: AC
  Filled 2017-10-29: qty 2

## 2017-10-29 SURGICAL SUPPLY — 45 items
BAG ZIPLOCK 12X15 (MISCELLANEOUS) ×4 IMPLANT
BANDAGE ACE 6X5 VEL STRL LF (GAUZE/BANDAGES/DRESSINGS) ×4 IMPLANT
BLADE SAW SGTL 11.0X1.19X90.0M (BLADE) ×4 IMPLANT
BLADE SAW SGTL 13.0X1.19X90.0M (BLADE) ×4 IMPLANT
BOWL SMART MIX CTS (DISPOSABLE) ×4 IMPLANT
CAPT KNEE TOTAL 3 ATTUNE ×4 IMPLANT
CEMENT HV SMART SET (Cement) ×8 IMPLANT
COVER SURGICAL LIGHT HANDLE (MISCELLANEOUS) ×4 IMPLANT
CUFF TOURN SGL QUICK 34 (TOURNIQUET CUFF) ×2
CUFF TRNQT CYL 34X4X40X1 (TOURNIQUET CUFF) ×2 IMPLANT
DECANTER SPIKE VIAL GLASS SM (MISCELLANEOUS) ×8 IMPLANT
DERMABOND ADVANCED (GAUZE/BANDAGES/DRESSINGS) ×2
DERMABOND ADVANCED .7 DNX12 (GAUZE/BANDAGES/DRESSINGS) ×2 IMPLANT
DRAPE U-SHAPE 47X51 STRL (DRAPES) ×4 IMPLANT
DRESSING AQUACEL AG SP 3.5X10 (GAUZE/BANDAGES/DRESSINGS) ×2 IMPLANT
DRSG AQUACEL AG SP 3.5X10 (GAUZE/BANDAGES/DRESSINGS) ×4
DURAPREP 26ML APPLICATOR (WOUND CARE) ×8 IMPLANT
ELECT REM PT RETURN 15FT ADLT (MISCELLANEOUS) ×4 IMPLANT
GLOVE BIO SURGEON STRL SZ7 (GLOVE) ×4 IMPLANT
GLOVE BIOGEL PI IND STRL 7.0 (GLOVE) ×4 IMPLANT
GLOVE BIOGEL PI IND STRL 7.5 (GLOVE) ×6 IMPLANT
GLOVE BIOGEL PI IND STRL 8.5 (GLOVE) ×2 IMPLANT
GLOVE BIOGEL PI INDICATOR 7.0 (GLOVE) ×4
GLOVE BIOGEL PI INDICATOR 7.5 (GLOVE) ×6
GLOVE BIOGEL PI INDICATOR 8.5 (GLOVE) ×2
GLOVE ECLIPSE 8.0 STRL XLNG CF (GLOVE) ×8 IMPLANT
GLOVE ORTHO TXT STRL SZ7.5 (GLOVE) ×8 IMPLANT
GOWN STRL REUS W/TWL LRG LVL3 (GOWN DISPOSABLE) ×8 IMPLANT
GOWN STRL REUS W/TWL XL LVL3 (GOWN DISPOSABLE) ×8 IMPLANT
HANDPIECE INTERPULSE COAX TIP (DISPOSABLE) ×2
MANIFOLD NEPTUNE II (INSTRUMENTS) ×4 IMPLANT
PACK TOTAL KNEE CUSTOM (KITS) ×4 IMPLANT
POSITIONER SURGICAL ARM (MISCELLANEOUS) ×4 IMPLANT
SET HNDPC FAN SPRY TIP SCT (DISPOSABLE) ×2 IMPLANT
SET PAD KNEE POSITIONER (MISCELLANEOUS) ×4 IMPLANT
SUT MNCRL AB 4-0 PS2 18 (SUTURE) ×4 IMPLANT
SUT STRATAFIX 0 PDS 27 VIOLET (SUTURE) ×4
SUT VIC AB 1 CT1 36 (SUTURE) ×4 IMPLANT
SUT VIC AB 2-0 CT1 27 (SUTURE) ×6
SUT VIC AB 2-0 CT1 TAPERPNT 27 (SUTURE) ×6 IMPLANT
SUTURE STRATFX 0 PDS 27 VIOLET (SUTURE) ×2 IMPLANT
SYR 50ML LL SCALE MARK (SYRINGE) ×4 IMPLANT
TRAY FOLEY CATH SILVER 14FR (SET/KITS/TRAYS/PACK) ×4 IMPLANT
WRAP KNEE MAXI GEL POST OP (GAUZE/BANDAGES/DRESSINGS) ×4 IMPLANT
YANKAUER SUCT BULB TIP 10FT TU (MISCELLANEOUS) ×4 IMPLANT

## 2017-10-29 NOTE — Op Note (Signed)
NAME:  Jasmine Richard                      MEDICAL RECORD NO.:  [id number]                             FACILITY:  [facility]      PHYSICIAN:  [name], [name]  DATE OF BIRTH:  11/14/1956      DATE OF PROCEDURE:  10/29/2017                                     OPERATIVE REPORT         PREOPERATIVE DIAGNOSIS:  Left knee osteoarthritis.  #2.  Right knee osteoarthritis and pain     POSTOPERATIVE DIAGNOSIS:  Left knee osteoarthritis.  #2.  Right knee osteoarthritis and pain     FINDINGS:  The patient was noted to have complete loss of cartilage and   bone-on-bone arthritis with associated osteophytes in all 3 compartments of   the knee with a significant synovitis and associated effusion.      PROCEDURE:  Left total knee replacement.  #2.  Right knee intra-articular cortisone injection     COMPONENTS USED:  DePuy Attune rotating platform posterior stabilized knee   system, a size 5N femur, 4 tibia, size 6 mm PS AOX insert, and 35 anatomic patellar   button.      SURGEON:  [name], [name]      ASSISTANT: Lanney Gins, PA-C.      ANESTHESIA:  Regional and Spinal.      SPECIMENS:  None.      COMPLICATION:  None.      DRAINS: None.  EBL: Less than 100 cc     TOURNIQUET TIME:   Total Tourniquet Time Documented: Thigh (Left) - 29 minutes Total: Thigh (Left) - 29 minutes  .      The patient was stable to the recovery room.      INDICATION FOR PROCEDURE:  Jasmine Richard is a 61 y.o. female patient of   mine.  The patient had been seen, evaluated, and treated conservatively in the   office with medication, activity modification, and injections.  The patient had   radiographic changes of bone-on-bone arthritis with endplate sclerosis and osteophytes noted.      The patient failed conservative measures including medication, injections, and activity modification, and at this point was ready for more definitive measures.   Based on the radiographic changes and failed  conservative measures, the patient   decided to proceed with total knee replacement.  Risks of infection,   DVT, component failure, need for revision surgery, postop course, and   expectations were all   discussed and reviewed.  Consent was obtained for benefit of pain   relief.      PROCEDURE IN DETAIL:  The patient was brought to the operative theater.   Once adequate anesthesia, preoperative antibiotics, 2 gm of Ancef, 1 g of tranexamic acid, and 10 mg of Decadron administered, the patient was positioned supine with the left thigh tourniquet placed.  The  left lower extremity was prepped and draped in sterile fashion.  A time-   out was performed identifying the patient, planned procedure, and   extremity.      The left lower extremity was placed in the [facility] leg  holder.  The leg was   exsanguinated, tourniquet elevated to 250 mmHg.  A midline incision was   made followed by median parapatellar arthrotomy.  Following initial   exposure, attention was first directed to the patella.  Precut   measurement was noted to be 21 mm.  I resected down to 14 mm and used a  35 anatomic patellar button to restore patellar height as well as cover the cut   surface.      The lug holes were drilled and a metal shim was placed to protect the   patella from retractors and saw blades.      At this point, attention was now directed to the femur.  The femoral   canal was opened with a drill, irrigated to try to prevent fat emboli.  An   intramedullary rod was passed at 3 degrees valgus, 9 mm of bone was   resected off the distal femur.  Following this resection, the tibia was   subluxated anteriorly.  Using the extramedullary guide, 2 mm of bone was resected off   the proximal medial tibia.  We confirmed the gap would be   stable medially and laterally with a size 5 spacer block as well as confirmed   the cut was perpendicular in the coronal plane, checking with an alignment rod.      Once this was  done, I sized the femur to be a size 5 in the anterior-   posterior dimension, chose a narrow component based on medial and   lateral dimension.  The size 5 rotation block was then pinned in   position anterior referenced using the C-clamp to set rotation.  The   anterior, posterior, and  chamfer cuts were made without difficulty nor   notching making certain that I was along the anterior cortex to help   with flexion gap stability.      The final box cut was made off the lateral aspect of distal femur.      At this point, the tibia was sized to be a size 4, the size 4 tray was   then pinned in position through the medial third of the tubercle,   drilled, and keel punched.  Trial reduction was now carried with a 5 femur, 4 tibia, a size 5 then 6 mm PS insert, and the 35 anatomic patella botton.  The knee was brought to   extension, full extension with good flexion stability with the patella   tracking through the trochlea without application of pressure.  Given   all these findings the femoral lug holes were drilled and then the trial components removed.  Final components were   opened and cement was mixed.  The knee was irrigated with normal saline   solution and pulse lavage.  The synovial lining was   then injected with 30 cc of 0.25% Marcaine with epinephrine and 1 cc of Toradol +30 cc of NS for a total of 61 cc.      The knee was irrigated.  Final implants were then cemented onto clean and   dried cut surfaces of bone with the knee brought to extension with a size 6  mm PS trial insert.      Once the cement had fully cured, the excess cement was removed   throughout the knee.  I confirmed I was satisfied with the range of   motion and stability, and the final size 6 mm PS AOX insert was chosen.  It was   placed into the knee.      The tourniquet had been let down at 29 minutes.  No significant   hemostasis required.  The   extensor mechanism was then reapproximated using #1  Vicryl and #0 Stratafix sutures with the knee   in flexion.  The   remaining wound was closed with 2-0 Vicryl and running 4-0 Monocryl.   The knee was cleaned, dried, dressed sterilely using Dermabond and   Aquacel dressing.  The patient was then   brought to recovery room in stable condition, tolerating the procedure   well.   Once the left knee procedure was completed and the dressing applied attention was directed to the right knee.  Under a Betadine prep through a superior lateral portal her right knee was injected with 80 mg of Depo-Medrol, lidocaine.  We will follow-up in the postoperative period from her left knee to see how her right knee is responded to this conservative treatment.  Please note that Physician Assistant, Lanney Gins, PA-C, was present for the entirety of the case, and was utilized for pre-operative positioning, peri-operative retractor management, general facilitation of the procedure.  He was also utilized for primary wound closure at the end of the case.              Madlyn Frankel Charlann Boxer, [name]    10/29/2017 8:44 AM

## 2017-10-29 NOTE — Transfer of Care (Signed)
Immediate Anesthesia Transfer of Care Note  Patient: Jasmine Richard  Procedure(s) Performed: LEFT TOTAL KNEE ARTHROPLASTY (Left Knee) RIGHT KNEE CORTISONE INJECTION (Right Knee)  Patient Location: PACU  Anesthesia Type:Spinal and MAC combined with regional for post-op pain  Level of Consciousness: awake, alert  and oriented  Airway & Oxygen Therapy: Patient Spontanous Breathing and Patient connected to face mask oxygen  Post-op Assessment: Report given to RN and Post -op Vital signs reviewed and stable  Post vital signs: Reviewed and stable  Last Vitals:  Vitals:   10/29/17 0541  BP: 107/65  Pulse: 61  Resp: 18  Temp: 37 C  SpO2: 100%    Last Pain:  Vitals:   10/29/17 0541  TempSrc: Oral      Patients Stated Pain Goal: 5 (14/23/95 3202)  Complications: No apparent anesthesia complications

## 2017-10-29 NOTE — Anesthesia Procedure Notes (Addendum)
Anesthesia Regional Block: Adductor canal block   Pre-Anesthetic Checklist: ,, timeout performed, Correct Patient, Correct Site, Correct Laterality, Correct Procedure, Correct Position, site marked, Risks and benefits discussed,  Surgical consent,  Pre-op evaluation,  At surgeon's request and post-op pain management  Laterality: Left and Lower  Prep: chloraprep       Needles:  Injection technique: Single-shot  Needle Type: Echogenic Stimulator Needle     Needle Length: 9cm  Needle Gauge: 21   Needle insertion depth: 4 cm   Additional Needles:   Procedures:,,,, ultrasound used (permanent image in chart),,,,  Narrative:  Start time: 10/29/2017 7:00 AM End time: 10/29/2017 7:10 AM Injection made incrementally with aspirations every 5 mL. Anesthesiologist: Leilani Able, MD

## 2017-10-29 NOTE — Anesthesia Procedure Notes (Signed)
Date/Time: 10/29/2017 7:20 AM Performed by: Vanessa Ackerman, CRNA Oxygen Delivery Method: Simple face mask

## 2017-10-29 NOTE — Discharge Instructions (Signed)

## 2017-10-29 NOTE — Evaluation (Signed)
Physical Therapy Evaluation Patient Details Name: Jasmine Richard MRN: [id number] DOB: 10-01-1956 Today's Date: 10/29/2017   History of Present Illness  61 yo female s/p L TKA and R knee injection 10/29/17.   Clinical Impression  On eval, POD 0, pt was Min assist for mobility. She walked ~75 feet with a RW. Pain rated 9/10 with activity. Will follow and progress activity as tolerated. Pt stated she is unsure of f/u therapy plan at time of eval.     Follow Up Recommendations DC plan and follow up therapy as arranged by surgeon    Equipment Recommendations  None recommended by PT    Recommendations for Other Services OT consult     Precautions / Restrictions Precautions Precautions: Knee Restrictions Weight Bearing Restrictions: No LLE Weight Bearing: Weight bearing as tolerated      Mobility  Bed Mobility Overal bed mobility: Needs Assistance Bed Mobility: Supine to Sit     Supine to sit: Min assist;HOB elevated     General bed mobility comments: Assist for LEs.   Transfers Overall transfer level: Needs assistance Equipment used: Rolling walker (2 wheeled) Transfers: Sit to/from Stand Sit to Stand: Min assist         General transfer comment: Assist to rise, stabilize, control descent. VC safety, hand/LE placement  Ambulation/Gait Ambulation/Gait assistance: Min assist Ambulation Distance (Feet): 75 Feet Assistive device: Rolling walker (2 wheeled) Gait Pattern/deviations: Step-to pattern;Step-through pattern;Decreased stride length     General Gait Details: Assist to stabilize intermittently.   Stairs            Wheelchair Mobility    Modified Rankin (Stroke Patients Only)       Balance                                             Pertinent Vitals/Pain Pain Assessment: 0-10 Pain Score: 9  Pain Location: L knee Pain Descriptors / Indicators: Sore;Aching Pain Intervention(s): Monitored during session;Repositioned;Ice  applied    Home Living Family/patient expects to be discharged to:: Private residence Living Arrangements: Alone   Type of Home: Other(Comment)(townhome) Home Access: Stairs to enter   Entrance Stairs-Number of Steps: 1 Home Layout: One level;Able to live on main level with bedroom/bathroom Home Equipment: Tub bench;Walker - 2 wheels;Walker - 4 wheels;Cane - single point;Bedside commode;Crutches      Prior Function Level of Independence: Independent               Hand Dominance        Extremity/Trunk Assessment   Upper Extremity Assessment Upper Extremity Assessment: Defer to OT evaluation    Lower Extremity Assessment Lower Extremity Assessment: Generalized weakness(s/p L TKA)    Cervical / Trunk Assessment Cervical / Trunk Assessment: Normal  Communication   Communication: No difficulties  Cognition Arousal/Alertness: Awake/alert Behavior During Therapy: WFL for tasks assessed/performed Overall Cognitive Status: Within Functional Limits for tasks assessed                                        General Comments      Exercises     Assessment/Plan    PT Assessment Patient needs continued PT services  PT Problem List Decreased strength;Decreased mobility;Decreased activity tolerance;Decreased balance;Decreased range of motion;Decreased knowledge of use of DME;Pain;Decreased knowledge of precautions  PT Treatment Interventions DME instruction;Gait training;Therapeutic activities;Therapeutic exercise;Patient/family education;Balance training;Functional mobility training;Stair training    PT Goals (Current goals can be found in the Care Plan section)  Acute Rehab PT Goals Patient Stated Goal: regain PLOF and independence PT Goal Formulation: With patient Time For Goal Achievement: 11/12/17 Potential to Achieve Goals: Good    Frequency 7X/week   Barriers to discharge        Co-evaluation               AM-PAC PT "6  Clicks" Daily Activity  Outcome Measure Difficulty turning over in bed (including adjusting bedclothes, sheets and blankets)?: Unable Difficulty moving from lying on back to sitting on the side of the bed? : Unable Difficulty sitting down on and standing up from a chair with arms (e.g., wheelchair, bedside commode, etc,.)?: Unable Help needed moving to and from a bed to chair (including a wheelchair)?: A Little Help needed walking in hospital room?: A Little Help needed climbing 3-5 steps with a railing? : A Little 6 Click Score: 12    End of Session Equipment Utilized During Treatment: Gait belt Activity Tolerance: Patient tolerated treatment well Patient left: in chair;with call bell/phone within reach   PT Visit Diagnosis: Pain;Muscle weakness (generalized) (M62.81);Difficulty in walking, not elsewhere classified (R26.2) Pain - Right/Left: Left Pain - part of body: Knee    Time: 5361-4431 PT Time Calculation (min) (ACUTE ONLY): 14 min   Charges:   PT Evaluation $PT Eval Moderate Complexity: 1 Mod     PT G Codes:          Rebeca Alert, MPT Pager: [phone]

## 2017-10-29 NOTE — Anesthesia Procedure Notes (Signed)
Spinal  Patient location during procedure: OR Start time: 10/29/2017 7:22 AM End time: 10/29/2017 7:27 AM Staffing Anesthesiologist: Leilani Able, MD Resident/CRNA: Vanessa Hebron, CRNA Performed: resident/CRNA  Preanesthetic Checklist Completed: patient identified, site marked, surgical consent, pre-op evaluation, timeout performed, IV checked, risks and benefits discussed and monitors and equipment checked Spinal Block Patient position: sitting Prep: DuraPrep Patient monitoring: heart rate, continuous pulse ox and blood pressure Approach: midline Location: L3-4 Needle Needle gauge: 24 G Needle length: 10 cm Needle insertion depth: 9 cm Assessment Sensory level: T6

## 2017-10-29 NOTE — Interval H&P Note (Signed)
History and Physical Interval Note:  10/29/2017 7:04 AM  Jasmine Richard  has presented today for surgery, with the diagnosis of Left knee osteoarthritis  The various methods of treatment have been discussed with the patient and family. After consideration of risks, benefits and other options for treatment, the patient has consented to  Procedure(s) with comments: LEFT TOTAL KNEE ARTHROPLASTY (Left) - 90 mins as a surgical intervention .  The patient's history has been reviewed, patient examined, no change in status, stable for surgery.  I have reviewed the patient's chart and labs.  Questions were answered to the patient's satisfaction.     [name]

## 2017-10-29 NOTE — Anesthesia Preprocedure Evaluation (Signed)
Anesthesia Evaluation  Patient identified by MRN, date of birth, ID band Patient awake    Reviewed: Allergy & Precautions, NPO status , Patient's Chart, lab work & pertinent test results  Airway Mallampati: I       Dental no notable dental hx. (+) Teeth Intact   Pulmonary neg pulmonary ROS,    Pulmonary exam normal breath sounds clear to auscultation       Cardiovascular negative cardio ROS Normal cardiovascular exam Rhythm:Regular Rate:Normal     Neuro/Psych    GI/Hepatic Neg liver ROS,   Endo/Other  negative endocrine ROS  Renal/GU negative Renal ROS  negative genitourinary   Musculoskeletal   Abdominal Normal abdominal exam  (+)   Peds  Hematology   Anesthesia Other Findings   Reproductive/Obstetrics                             Anesthesia Physical Anesthesia Plan  ASA: II  Anesthesia Plan: Spinal   Post-op Pain Management:  Regional for Post-op pain   Induction:   PONV Risk Score and Plan:   Airway Management Planned: Natural Airway and Nasal Cannula  Additional Equipment:   Intra-op Plan:   Post-operative Plan:   Informed Consent: I have reviewed the patients History and Physical, chart, labs and discussed the procedure including the risks, benefits and alternatives for the proposed anesthesia with the patient or authorized representative who has indicated his/her understanding and acceptance.     Plan Discussed with: CRNA and Surgeon  Anesthesia Plan Comments:         Anesthesia Quick Evaluation

## 2017-10-29 NOTE — Anesthesia Postprocedure Evaluation (Signed)
Anesthesia Post Note  Patient: Jasmine Richard  Procedure(s) Performed: LEFT TOTAL KNEE ARTHROPLASTY (Left Knee) RIGHT KNEE CORTISONE INJECTION (Right Knee)     Patient location during evaluation: PACU Anesthesia Type: Spinal Level of consciousness: awake and sedated Pain management: pain level controlled Vital Signs Assessment: post-procedure vital signs reviewed and stable Respiratory status: spontaneous breathing Cardiovascular status: stable Postop Assessment: no headache, no backache, spinal receding, patient able to bend at knees and no apparent nausea or vomiting Anesthetic complications: no    Last Vitals:  Vitals:   10/29/17 1015 10/29/17 1030  BP: 104/66 105/68  Pulse: 62 60  Resp: 12 12  Temp: (!) 36.4 C 36.4 C  SpO2: 100% 100%    Last Pain:  Vitals:   10/29/17 1030  TempSrc:   PainSc: 0-No pain   Pain Goal: Patients Stated Pain Goal: 5 (10/29/17 0555)  LLE Motor Response: Purposeful movement (10/29/17 1030) LLE Sensation: Full sensation (10/29/17 1030) RLE Motor Response: Purposeful movement (10/29/17 1030) RLE Sensation: Full sensation (10/29/17 1030) L Sensory Level: S1-Sole of foot, small toes (10/29/17 1030) R Sensory Level: S1-Sole of foot, small toes (10/29/17 1030)  Nedda Gains JR,JOHN [name]

## 2017-10-30 DIAGNOSIS — M170 Bilateral primary osteoarthritis of knee: Secondary | ICD-10-CM | POA: Diagnosis not present

## 2017-10-30 LAB — BASIC METABOLIC PANEL
ANION GAP: 4 — AB (ref 5–15)
BUN: 9 mg/dL (ref 6–20)
CHLORIDE: 108 mmol/L (ref 101–111)
CO2: 27 mmol/L (ref 22–32)
Calcium: 8.6 mg/dL — ABNORMAL LOW (ref 8.9–10.3)
Creatinine, Ser: 0.47 mg/dL (ref 0.44–1.00)
GFR calc non Af Amer: >60 mL/min (ref >60)
Glucose, Bld: 117 mg/dL — ABNORMAL HIGH (ref 65–99)
POTASSIUM: 3.9 mmol/L (ref 3.5–5.1)
SODIUM: 139 mmol/L (ref 135–145)

## 2017-10-30 LAB — CBC
HCT: 32.5 % — ABNORMAL LOW (ref 36.0–46.0)
HEMOGLOBIN: 11.3 g/dL — AB (ref 12.0–15.0)
MCH: 34.3 pg — ABNORMAL HIGH (ref 26.0–34.0)
MCHC: 34.8 g/dL (ref 30.0–36.0)
MCV: 98.8 fL (ref 78.0–100.0)
Platelets: 140 10*3/uL — ABNORMAL LOW (ref 150–400)
RBC: 3.29 MIL/uL — AB (ref 3.87–5.11)
RDW: 14.2 % (ref 11.5–15.5)
WBC: 6.7 10*3/uL (ref 4.0–10.5)

## 2017-10-30 NOTE — Evaluation (Signed)
Occupational Therapy Evaluation Patient Details Name: Jasmine Richard MRN: [id number] DOB: September 28, 1956 Today's Date: 10/30/2017    History of Present Illness 61 yo female s/p L TKA and R knee injection 10/29/17.    Clinical Impression   Pt ist a min A level with LB ADLs and sup with ADL mobility. Pt will have assist from family/friends prn and has all necessary DME at home. All education completed and no further acute OT is indicated at this time    Follow Up Recommendations  DC plan and follow up therapy as arranged by surgeon;Supervision - Intermittent    Equipment Recommendations  Other (comment)(reacher)    Recommendations for Other Services       Precautions / Restrictions Precautions Precautions: Knee Precaution Comments: ecuated no pillow under knee Restrictions Weight Bearing Restrictions: No LLE Weight Bearing: Weight bearing as tolerated      Mobility Bed Mobility Overal bed mobility: Needs Assistance Bed Mobility: Supine to Sit;Sit to Supine     Supine to sit: Supervision Sit to supine: Supervision   General bed mobility comments: for safety  Transfers Overall transfer level: Needs assistance Equipment used: Rolling walker (2 wheeled) Transfers: Sit to/from Stand Sit to Stand: Supervision         General transfer comment: VCs safety, hand/LE placement    Balance Overall balance assessment: Needs assistance Sitting-balance support: No upper extremity supported;Feet supported;Feet unsupported Sitting balance-Leahy Scale: Good     Standing balance support: Single extremity supported;Bilateral upper extremity supported;During functional activity Standing balance-Leahy Scale: Fair                             ADL either performed or assessed with clinical judgement   ADL Overall ADL's : Needs assistance/impaired Eating/Feeding: Independent;Sitting   Grooming: Wash/dry hands;Wash/dry face;Standing;Supervision/safety;Set up   Upper  Body Bathing: Set up;Sitting   Lower Body Bathing: Minimal assistance   Upper Body Dressing : Set up;Sitting   Lower Body Dressing: Minimal assistance   Toilet Transfer: Supervision/safety;Ambulation;RW;Comfort height toilet;Grab bars   Toileting- Clothing Manipulation and Hygiene: Supervision/safety;Sit to/from Nurse, children's Details (indicate cue type and reason): pt has tub bench at home, able to verbalize proper technique for use Functional mobility during ADLs: Rolling walker;Cueing for safety;Supervision/safety       Vision Baseline Vision/History: Wears glasses Wears Glasses: Reading only Patient Visual Report: No change from baseline       Perception     Praxis      Pertinent Vitals/Pain Pain Assessment: 0-10 Pain Score: 5  Pain Location: L knee Pain Descriptors / Indicators: Sore;Aching Pain Intervention(s): Monitored during session;Premedicated before session;Repositioned;Ice applied     Hand Dominance Right   Extremity/Trunk Assessment Upper Extremity Assessment Upper Extremity Assessment: Overall WFL for tasks assessed   Lower Extremity Assessment Lower Extremity Assessment: Defer to PT evaluation   Cervical / Trunk Assessment Cervical / Trunk Assessment: Normal   Communication Communication Communication: No difficulties   Cognition Arousal/Alertness: Awake/alert Behavior During Therapy: WFL for tasks assessed/performed Overall Cognitive Status: Within Functional Limits for tasks assessed                                     General Comments   pt pleasant and cooperative    Exercises    Shoulder Instructions      Home Living Family/patient expects to be discharged  to:: Private residence Living Arrangements: Alone Available Help at Discharge: Family;Friend(s) Type of Home: Other(Comment)(townhouse) Home Access: Stairs to enter Entrance Stairs-Number of Steps: 1   Home Layout: One level;Able to live on  main level with bedroom/bathroom     Bathroom Shower/Tub: Chief Strategy Officer: Standard     Home Equipment: Tub bench;Walker - 2 wheels;Walker - 4 wheels;Cane - single point;Bedside commode;Crutches          Prior Functioning/Environment Level of Independence: Independent                 OT Problem List: Decreased activity tolerance;Impaired balance (sitting and/or standing);Pain      OT Treatment/Interventions:      OT Goals(Current goals can be found in the care plan section) Acute Rehab OT Goals Patient Stated Goal: regain PLOF and independence OT Goal Formulation: With patient  OT Frequency:     Barriers to D/C:    no barriers       Co-evaluation              AM-PAC PT "6 Clicks" Daily Activity     Outcome Measure Help from another person eating meals?: None Help from another person taking care of personal grooming?: A Little Help from another person toileting, which includes using toliet, bedpan, or urinal?: A Little Help from another person bathing (including washing, rinsing, drying)?: A Little Help from another person to put on and taking off regular upper body clothing?: None Help from another person to put on and taking off regular lower body clothing?: A Little 6 Click Score: 20   End of Session Equipment Utilized During Treatment: Rolling walker;Other (comment)(3 in 1)  Activity Tolerance: Patient tolerated treatment well Patient left: in bed;with call bell/phone within reach  OT Visit Diagnosis: Other abnormalities of gait and mobility (R26.89);Unsteadiness on feet (R26.81);Pain                Time: 1140-1207 OT Time Calculation (min): 27 min Charges:  OT General Charges $OT Visit: 1 Visit OT Evaluation $OT Eval Low Complexity: 1 Low OT Treatments $Therapeutic Activity: 8-22 mins G-Codes: OT G-codes **NOT FOR INPATIENT CLASS** Functional Assessment Tool Used: AM-PAC 6 Clicks Daily Activity Functional Limitation: Other  OT primary Other OT Primary Current Status (U1324): At least 20 percent but less than 40 percent impaired, limited or restricted Other OT Primary Goal Status (M0102): At least 20 percent but less than 40 percent impaired, limited or restricted Other OT Primary Discharge Status [phone]): At least 20 percent but less than 40 percent impaired, limited or restricted     Galen Manila 10/30/2017, 1:23 PM

## 2017-10-30 NOTE — Progress Notes (Signed)
Physical Therapy Treatment Patient Details Name: Jasmine Richard MRN: [id number] DOB: [birth year] Today's Date: 10/30/2017    History of Present Illness 61 yo female s/p L TKA and R knee injection 10/29/17.     PT Comments    Progressing well with mobility. Will plan to have a 2nd session prior to d/c later today.    Follow Up Recommendations  DC plan and follow up therapy as arranged by surgeon     Equipment Recommendations       Recommendations for Other Services OT consult     Precautions / Restrictions Precautions Precautions: Knee Restrictions Weight Bearing Restrictions: No LLE Weight Bearing: Weight bearing as tolerated    Mobility  Bed Mobility Overal bed mobility: Needs Assistance Bed Mobility: Supine to Sit;Sit to Supine     Supine to sit: Supervision Sit to supine: Supervision   General bed mobility comments: for safety  Transfers Overall transfer level: Needs assistance Equipment used: Rolling walker (2 wheeled) Transfers: Sit to/from Stand Sit to Stand: Supervision         General transfer comment: VCs safety, hand/LE placement  Ambulation/Gait Ambulation/Gait assistance: Min guard Ambulation Distance (Feet): 125 Feet Assistive device: Rolling walker (2 wheeled) Gait Pattern/deviations: Step-to pattern;Step-through pattern;Decreased stride length     General Gait Details: close guard for safety.    Stairs            Wheelchair Mobility    Modified Rankin (Stroke Patients Only)       Balance                                            Cognition Arousal/Alertness: Awake/alert Behavior During Therapy: WFL for tasks assessed/performed Overall Cognitive Status: Within Functional Limits for tasks assessed                                        Exercises Total Joint Exercises Ankle Circles/Pumps: AROM;10 reps;Supine Quad Sets: AROM;Both;10 reps;Supine Short Arc Quad: AROM;Left;10  reps;Seated Straight Leg Raises: AROM;Left;10 reps;Supine Knee Flexion: AAROM;Left;10 reps;Seated Goniometric ROM: ~5-75 degrees    General Comments        Pertinent Vitals/Pain Pain Assessment: 0-10 Pain Score: 8  Pain Location: L knee Pain Descriptors / Indicators: Sore;Aching Pain Intervention(s): Monitored during session;Repositioned;Ice applied    Home Living                      Prior Function            PT Goals (current goals can now be found in the care plan section) Progress towards PT goals: Progressing toward goals    Frequency    7X/week      PT Plan Current plan remains appropriate    Co-evaluation              AM-PAC PT "6 Clicks" Daily Activity  Outcome Measure  Difficulty turning over in bed (including adjusting bedclothes, sheets and blankets)?: A Little Difficulty moving from lying on back to sitting on the side of the bed? : A Little Difficulty sitting down on and standing up from a chair with arms (e.g., wheelchair, bedside commode, etc,.)?: A Little Help needed moving to and from a bed to chair (including a wheelchair)?: A Little Help needed walking in hospital  room?: A Little Help needed climbing 3-5 steps with a railing? : A Little 6 Click Score: 18    End of Session Equipment Utilized During Treatment: Gait belt Activity Tolerance: Patient tolerated treatment well Patient left: in bed;with call bell/phone within reach   PT Visit Diagnosis: Pain;Muscle weakness (generalized) (M62.81);Difficulty in walking, not elsewhere classified (R26.2) Pain - Right/Left: Left Pain - part of body: Knee     Time: 1000-1025 PT Time Calculation (min) (ACUTE ONLY): 25 min  Charges:  $Gait Training: 8-22 mins $Therapeutic Exercise: 8-22 mins                    G Codes:          Rebeca Alert, MPT Pager: [phone]

## 2017-10-30 NOTE — Progress Notes (Signed)
Patient ID: Jasmine Richard, female   DOB: 1956-10-29, 61 y.o.   MRN: [id number] Subjective: 1 Day Post-Op Procedure(s) (LRB): LEFT TOTAL KNEE ARTHROPLASTY (Left) RIGHT KNEE CORTISONE INJECTION (Right)    Patient reports pain as mild to moderate.  Relatively good night, no events  Objective:   VITALS:   Vitals:   10/30/17 0231 10/30/17 0557  BP: (!) 94/51 111/63  Pulse: 75 (!) 59  Resp: 16 17  Temp: 99 F (37.2 C) 98.4 F (36.9 C)  SpO2: 93% 93%    Neurovascular intact Incision: dressing C/D/I  LABS Recent Labs    10/30/17 0540  HGB 11.3*  HCT 32.5*  WBC 6.7  PLT 140*    Recent Labs    10/30/17 0540  NA 139  K 3.9  BUN 9  CREATININE 0.47  GLUCOSE 117*    No results for input(s): LABPT, INR in the last 72 hours.   Assessment/Plan: 1 Day Post-Op Procedure(s) (LRB): LEFT TOTAL KNEE ARTHROPLASTY (Left) RIGHT KNEE CORTISONE INJECTION (Right)   Advance diet Up with therapy   Home today after PT RTC in 2 weeks Will check on outpt PT versus HHPT prior to discharge

## 2017-10-30 NOTE — Progress Notes (Signed)
Physical Therapy Treatment Patient Details Name: Jasmine Richard MRN: [id number] DOB: 12-Jul-1956 Today's Date: 10/30/2017    History of Present Illness 61 yo female s/p L TKA and R knee injection 10/29/17.     PT Comments    2nd session to practice gait training and stair training. Issued HEP for pt to perform 2x/day until she begins either HHPT or OPPT. All education completed. Okay to d/c from PT standpoint-RN aware.    Follow Up Recommendations  DC plan and follow up therapy as arranged by surgeon     Equipment Recommendations  None recommended by PT    Recommendations for Other Services OT consult     Precautions / Restrictions Precautions Precautions: Knee Precaution Comments: ecuated no pillow under knee Restrictions Weight Bearing Restrictions: No LLE Weight Bearing: Weight bearing as tolerated    Mobility  Bed Mobility Overal bed mobility: Needs Assistance Bed Mobility: Supine to Sit;Sit to Supine     Supine to sit: Supervision Sit to supine: Supervision   General bed mobility comments: for safety  Transfers Overall transfer level: Needs assistance Equipment used: Rolling walker (2 wheeled) Transfers: Sit to/from Stand Sit to Stand: Supervision         General transfer comment: VCs safety, hand/LE placement  Ambulation/Gait Ambulation/Gait assistance: Min guard Ambulation Distance (Feet): 125 Feet Assistive device: Rolling walker (2 wheeled) Gait Pattern/deviations: Step-to pattern;Step-through pattern;Decreased stride length     General Gait Details: close guard for safety.    Stairs Stairs: Yes Min guard Assist Stair Management: One rail Right;Step to pattern;Forwards Number of Stairs: 5 General stair comments: up and over portable steps x 2. VCs safety, sequence. close guard for safety.   Wheelchair Mobility    Modified Rankin (Stroke Patients Only)       Balance Overall balance assessment: Needs assistance Sitting-balance  support: No upper extremity supported;Feet supported;Feet unsupported Sitting balance-Leahy Scale: Good     Standing balance support: Single extremity supported;Bilateral upper extremity supported;During functional activity Standing balance-Leahy Scale: Fair                              Cognition Arousal/Alertness: Awake/alert Behavior During Therapy: WFL for tasks assessed/performed Overall Cognitive Status: Within Functional Limits for tasks assessed                                        Exercises Total Joint Exercises Ankle Circles/Pumps: AROM;10 reps;Supine Quad Sets: AROM;Both;10 reps;Supine Short Arc Quad: AROM;Left;10 reps;Seated Straight Leg Raises: AROM;Left;10 reps;Supine Knee Flexion: AAROM;Left;10 reps;Seated Goniometric ROM: ~5-75 degrees    General Comments        Pertinent Vitals/Pain Pain Assessment: 0-10 Pain Score: 5  Pain Location: L knee Pain Descriptors / Indicators: Sore;Aching Pain Intervention(s): Monitored during session;Repositioned;Ice applied    Home Living Family/patient expects to be discharged to:: Private residence Living Arrangements: Alone Available Help at Discharge: Family;Friend(s) Type of Home: Other(Comment)(townhouse) Home Access: Stairs to enter   Home Layout: One level;Able to live on main level with bedroom/bathroom Home Equipment: Tub bench;Walker - 2 wheels;Walker - 4 wheels;Cane - single point;Bedside commode;Crutches      Prior Function Level of Independence: Independent          PT Goals (current goals can now be found in the care plan section) Acute Rehab PT Goals Patient Stated Goal: regain PLOF and independence Progress  towards PT goals: Progressing toward goals    Frequency    7X/week      PT Plan Current plan remains appropriate    Co-evaluation              AM-PAC PT "6 Clicks" Daily Activity  Outcome Measure  Difficulty turning over in bed (including  adjusting bedclothes, sheets and blankets)?: None Difficulty moving from lying on back to sitting on the side of the bed? : None Difficulty sitting down on and standing up from a chair with arms (e.g., wheelchair, bedside commode, etc,.)?: None Help needed moving to and from a bed to chair (including a wheelchair)?: A Little Help needed walking in hospital room?: A Little Help needed climbing 3-5 steps with a railing? : A Little 6 Click Score: 21    End of Session Equipment Utilized During Treatment: Gait belt Activity Tolerance: Patient tolerated treatment well Patient left: in bed;with call bell/phone within reach   PT Visit Diagnosis: Pain;Muscle weakness (generalized) (M62.81);Difficulty in walking, not elsewhere classified (R26.2) Pain - Right/Left: Left Pain - part of body: Knee     Time: 1245-8099 PT Time Calculation (min) (ACUTE ONLY): 12 min  Charges:  $Gait Training: 8-22 mins $Therapeutic Exercise: 8-22 mins                    G Codes:          Rebeca Alert, MPT Pager: [phone]

## 2017-10-31 ENCOUNTER — Encounter: Payer: Self-pay | Admitting: Obstetrics & Gynecology

## 2017-10-31 ENCOUNTER — Other Ambulatory Visit (HOSPITAL_COMMUNITY)

## 2017-11-01 ENCOUNTER — Other Ambulatory Visit (HOSPITAL_COMMUNITY)

## 2017-11-05 ENCOUNTER — Other Ambulatory Visit (HOSPITAL_COMMUNITY)

## 2017-11-07 ENCOUNTER — Other Ambulatory Visit (HOSPITAL_COMMUNITY)

## 2017-11-08 ENCOUNTER — Other Ambulatory Visit (HOSPITAL_COMMUNITY)

## 2017-11-08 NOTE — Discharge Summary (Signed)
Physician Discharge Summary  Patient ID: Jasmine Richard MRN: [id number] DOB/AGE: September 16, 1956 61 y.o.  Admit date: 10/29/2017 Discharge date: 10/30/2017   Procedures:  Procedure(s) (LRB): LEFT TOTAL KNEE ARTHROPLASTY (Left) RIGHT KNEE CORTISONE INJECTION (Right)  Attending Physician:  Dr. Durene Romans   Admission Diagnoses:    Left knee primary OA / pain  Discharge Diagnoses:  Principal Problem:   S/P left TKA Active Problems:   S/P total knee replacement  Past Medical History:  Diagnosis Date  . ADD (attention deficit disorder)   . Anxiety   . Depression   . Diverticulosis   . Family history of adverse reaction to anesthesia    DAD FEELS LIKE SPIRALING IN A TUNNEL  . GERD (gastroesophageal reflux disease)   . H/O ETOH abuse    recovered  . Headache    migraine  . Heart murmur    stable  . IDA (iron deficiency anemia)    borderline  . Insomnia   . Osteoarthritis    Psoriatic and osteo  . Psoriatic arthritis (HCC)    Dr. Zenovia Jordan  . Urinary incontinence     HPI:    Jasmine Richard, 61 y.o. female, has a history of pain and functional disability in the left knee due to arthritis and has failed non-surgical conservative treatments for greater than 12 weeks to include NSAID's and/or analgesics, corticosteriod injections, use of assistive devices and activity modification.  Onset of symptoms was gradual, starting 1+ years ago with gradually worsening course since that time. The patient noted prior procedures on the knee to include  removal of hardware on the left knee on 08/20/2017.  Patient currently rates pain in the left knee(s) at 8 out of 10 with activity. Patient has night pain, worsening of pain with activity and weight bearing, pain that interferes with activities of daily living, pain with passive range of motion, crepitus and joint swelling.  Patient has evidence of periarticular osteophytes and joint space narrowing by imaging studies. There is no active  infection.   Risks, benefits and expectations were discussed with the patient.  Risks including but not limited to the risk of anesthesia, blood clots, nerve damage, blood vessel damage, failure of the prosthesis, infection and up to and including death.  Patient understand the risks, benefits and expectations and wishes to proceed with surgery.  PCP: Merlene Laughter, MD   Discharged Condition: good  Hospital Course:  Patient underwent the above stated procedure on 10/29/2017. Patient tolerated the procedure well and brought to the recovery room in good condition and subsequently to the floor.  POD #1 BP: 111/63 ; Pulse: 59 ; Temp: 98.4 F (36.9 C) ; Resp: 17 Patient reports pain as mild to moderate.  Relatively good night, no events. Neurovascular intact and incision: dressing C/D/I.   LABS  Basename    HGB     11.3  HCT     32.5    Discharge Exam: General appearance: alert, cooperative and no distress Extremities: Homans sign is negative, no sign of DVT, no edema, redness or tenderness in the calves or thighs and no ulcers, gangrene or trophic changes  Disposition: Home with follow up in 2 weeks   Follow-up Information    Durene Romans, MD. Schedule an appointment as soon as possible for a visit in 2 week(s).   Specialty:  Orthopedic Surgery Contact information: [address] [place] [address] [phone]           Discharge Instructions  Call MD / Call 911   Complete by:  As directed    If you experience chest pain or shortness of breath, CALL 911 and be transported to the hospital emergency room.  If you develope a fever above 101 F, pus (white drainage) or increased drainage or redness at the wound, or calf pain, call your surgeon's office.   Constipation Prevention   Complete by:  As directed    Drink plenty of fluids.  Prune juice may be helpful.  You may use a stool softener, such as Colace (over the counter) 100 mg twice a day.  Use MiraLax  (over the counter) for constipation as needed.   Diet - low sodium heart healthy   Complete by:  As directed    Driving restrictions   Complete by:  As directed    No driving for first 2-4 weeks depending on narcotic use, or until further directed from office   Increase activity slowly as tolerated   Complete by:  As directed       Allergies as of 10/30/2017   No Known Allergies     Medication List    STOP taking these medications   meloxicam 15 MG tablet Commonly known as:  MOBIC   methotrexate 2.5 MG tablet Commonly known as:  RHEUMATREX     TAKE these medications   aspirin 81 MG chewable tablet Commonly known as:  ASPIRIN CHILDRENS Chew 1 tablet (81 mg total) by mouth 2 (two) times daily.   Biotin 5000 MCG Caps Take 5,000 mcg by mouth daily.   CALCIUM 1200 PO Take 1 tablet by mouth daily.   cetirizine 10 MG tablet Commonly known as:  ZYRTEC Take 10 mg by mouth at bedtime.   cyanocobalamin 1000 MCG/ML injection Commonly known as:  (VITAMIN B-12) Inject 1 mL (1,000 mcg total) into the muscle every 30 (thirty) days.   docusate sodium 100 MG capsule Commonly known as:  COLACE Take 1 capsule (100 mg total) by mouth 2 (two) times daily.   escitalopram 20 MG tablet Commonly known as:  LEXAPRO Take 1 tablet (20 mg total) by mouth daily.   ferrous sulfate 325 (65 FE) MG tablet Take 325 mg by mouth daily. What changed:  Another medication with the same name was added. Make sure you understand how and when to take each.   ferrous sulfate 325 (65 FE) MG tablet Commonly known as:  FERROUSUL Take 1 tablet (325 mg total) by mouth 3 (three) times daily with meals. What changed:  You were already taking a medication with the same name, and this prescription was added. Make sure you understand how and when to take each.   folic acid 1 MG tablet Commonly known as:  FOLVITE Take 1 mg by mouth 2 (two) times daily.   HYDROcodone-acetaminophen 7.5-325 MG tablet Commonly  known as:  NORCO Take 1-2 tablets by mouth every 4 (four) hours as needed for moderate pain or severe pain.   hydrocortisone cream 1 % Apply 1 application topically daily as needed for itching (psoriasis).   loperamide 2 MG capsule Commonly known as:  IMODIUM Take 2-4 mg by mouth See admin instructions. Take 4mg  at onset of diarrhea then 2mg  every 2 hours as needed for diarrhea. Do not exceed 16mg  in 24hrs.   methocarbamol 500 MG tablet Commonly known as:  ROBAXIN Take 1 tablet (500 mg total) by mouth every 6 (six) hours as needed for muscle spasms.   multivitamin with minerals tablet Take 0.5  tablets by mouth 2 (two) times daily.   naltrexone 50 MG tablet Commonly known as:  DEPADE Take 50 mg by mouth at bedtime.   OMEGA 3 PO Take 1 capsule by mouth daily.   omeprazole 20 MG capsule Commonly known as:  PRILOSEC Take 20 mg by mouth 2 (two) times daily before a meal.   oxybutynin 5 MG 24 hr tablet Commonly known as:  DITROPAN-XL Take 5 mg by mouth at bedtime.   polyethylene glycol packet Commonly known as:  MIRALAX / GLYCOLAX Take 17 g by mouth 2 (two) times daily.   Potassium & Magnesium Aspartat 250-250 MG Caps Take 2 tablets by mouth 2 (two) times daily.   pregabalin 150 MG capsule Commonly known as:  LYRICA Take 1 capsule (150 mg total) by mouth 3 (three) times daily.   PROBIOTIC DAILY PO Take 1 capsule by mouth daily.   SUMAtriptan 100 MG tablet Commonly known as:  IMITREX May repeat in 2 hours if headache persists or recurs. What changed:    how much to take  how to take this  when to take this  reasons to take this  additional instructions   traZODone 100 MG tablet Commonly known as:  DESYREL Take 1-1 1/2 tablet HS What changed:    how much to take  how to take this  when to take this  additional instructions   valACYclovir 500 MG tablet Commonly known as:  VALTREX Take 2,000 mg by mouth See admin instructions. Take 2000 mg at first  sign of fever blisters, then take 2000 mg 12 hours later   vitamin C 500 MG tablet Commonly known as:  ASCORBIC ACID Take 500 mg by mouth 2 (two) times daily.   Zinc 50 MG Tabs Take 50 mg by mouth daily.        Signed: Anastasio Auerbach. Remington Highbaugh   PA-C  11/08/2017, 5:41 PM

## 2017-11-12 ENCOUNTER — Other Ambulatory Visit (HOSPITAL_COMMUNITY): Attending: Psychiatry

## 2017-11-14 ENCOUNTER — Other Ambulatory Visit (HOSPITAL_COMMUNITY)

## 2017-11-15 ENCOUNTER — Telehealth (HOSPITAL_COMMUNITY): Payer: Self-pay | Admitting: Psychology

## 2017-11-15 ENCOUNTER — Other Ambulatory Visit (HOSPITAL_COMMUNITY)

## 2017-11-19 ENCOUNTER — Other Ambulatory Visit (HOSPITAL_COMMUNITY)

## 2017-11-21 ENCOUNTER — Other Ambulatory Visit (HOSPITAL_COMMUNITY)

## 2017-11-22 ENCOUNTER — Other Ambulatory Visit (HOSPITAL_COMMUNITY)

## 2017-11-26 ENCOUNTER — Other Ambulatory Visit (HOSPITAL_COMMUNITY)

## 2017-11-28 ENCOUNTER — Encounter (HOSPITAL_COMMUNITY): Payer: Self-pay | Admitting: Medical

## 2017-11-28 ENCOUNTER — Telehealth (HOSPITAL_COMMUNITY): Payer: Self-pay | Admitting: Psychology

## 2017-11-28 ENCOUNTER — Other Ambulatory Visit (HOSPITAL_COMMUNITY): Attending: Psychiatry | Admitting: Medical

## 2017-11-28 DIAGNOSIS — Z9884 Bariatric surgery status: Secondary | ICD-10-CM

## 2017-11-28 DIAGNOSIS — G894 Chronic pain syndrome: Secondary | ICD-10-CM | POA: Diagnosis not present

## 2017-11-28 DIAGNOSIS — Z79899 Other long term (current) drug therapy: Secondary | ICD-10-CM | POA: Diagnosis not present

## 2017-11-28 DIAGNOSIS — M0579 Rheumatoid arthritis with rheumatoid factor of multiple sites without organ or systems involvement: Secondary | ICD-10-CM

## 2017-11-28 DIAGNOSIS — Z96652 Presence of left artificial knee joint: Secondary | ICD-10-CM | POA: Diagnosis not present

## 2017-11-28 DIAGNOSIS — F419 Anxiety disorder, unspecified: Secondary | ICD-10-CM

## 2017-11-28 DIAGNOSIS — M1712 Unilateral primary osteoarthritis, left knee: Secondary | ICD-10-CM | POA: Diagnosis not present

## 2017-11-28 DIAGNOSIS — F4321 Adjustment disorder with depressed mood: Secondary | ICD-10-CM | POA: Insufficient documentation

## 2017-11-28 DIAGNOSIS — F4310 Post-traumatic stress disorder, unspecified: Secondary | ICD-10-CM

## 2017-11-28 DIAGNOSIS — F1020 Alcohol dependence, uncomplicated: Secondary | ICD-10-CM | POA: Diagnosis present

## 2017-11-28 DIAGNOSIS — T7432XS Child psychological abuse, confirmed, sequela: Secondary | ICD-10-CM

## 2017-11-28 MED ORDER — PREGABALIN 150 MG PO CAPS: 150.0000 mg | ORAL_CAPSULE | Freq: Three times a day (TID) | ORAL | 2 refills | Status: DC

## 2017-11-28 NOTE — Progress Notes (Signed)
Patient ID: KANG ISHIDA, female   DOB: 31-Dec-1955, 62 y.o.   MRN: [id number] Pt seen after leave of absence from [facility] for LT TKR 10/24/2017:  Physician Discharge Summary  Patient ID: HARMAN FERRIN MRN: [id number] DOB/AGE: December 25, 1955 62 y.o.  Admit date: 10/29/2017 Discharge date: 10/30/2017   Procedures:  Procedure(s) (LRB): LEFT TOTAL KNEE ARTHROPLASTY (Left) RIGHT KNEE CORTISONE INJECTION (Right)  Attending Physician:  Dr. Durene Romans   Admission Diagnoses:   Frankey Poot Ottis Stain  Discharge Diagnoses:  Principal Problem:   S/P left TKA Active Problems:   S/P total knee replacement  Pt reports she is glad to be back.She has some pain from pulling rectus after quick motion to save [place] from hitting face on concrete.She is not taking any opiates at this time-using Meloxicam and ice. She repoprts the holidays were better than she expected -they have been a great stres previously.She admits she "does not like being a widow" but she has come to terms with the loss of her husband. She did not abuse opiates.She remained abstinent from alcohol-=thinking/being triggered only at times of anxiety. Her anxiety she reports is better. Her Lyrica would cost her $900 at commercial pharmacy but at [facility] provides coverage.She requires a refill today.

## 2017-11-29 ENCOUNTER — Other Ambulatory Visit (HOSPITAL_COMMUNITY): Admitting: Psychology

## 2017-11-29 DIAGNOSIS — F1020 Alcohol dependence, uncomplicated: Secondary | ICD-10-CM

## 2017-11-30 DIAGNOSIS — M25562 Pain in left knee: Secondary | ICD-10-CM | POA: Insufficient documentation

## 2017-12-02 ENCOUNTER — Encounter (HOSPITAL_COMMUNITY): Payer: Self-pay | Admitting: Psychology
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
## 2017-12-03 ENCOUNTER — Other Ambulatory Visit (HOSPITAL_COMMUNITY): Admitting: Psychology

## 2017-12-03 ENCOUNTER — Other Ambulatory Visit (HOSPITAL_COMMUNITY): Payer: Self-pay

## 2017-12-03 DIAGNOSIS — F1020 Alcohol dependence, uncomplicated: Secondary | ICD-10-CM | POA: Diagnosis not present

## 2017-12-03 DIAGNOSIS — F4310 Post-traumatic stress disorder, unspecified: Secondary | ICD-10-CM

## 2017-12-03 DIAGNOSIS — F419 Anxiety disorder, unspecified: Secondary | ICD-10-CM

## 2017-12-03 MED ORDER — PREGABALIN 150 MG PO CAPS: 150.0000 mg | ORAL_CAPSULE | Freq: Three times a day (TID) | ORAL | 2 refills | Status: DC

## 2017-12-03 MED ORDER — PREGABALIN 150 MG PO CAPS: 150.0000 mg | ORAL_CAPSULE | Freq: Three times a day (TID) | ORAL | 0 refills | Status: DC

## 2017-12-05 ENCOUNTER — Encounter (HOSPITAL_COMMUNITY): Payer: Self-pay | Admitting: Medical

## 2017-12-05 ENCOUNTER — Other Ambulatory Visit (HOSPITAL_BASED_OUTPATIENT_CLINIC_OR_DEPARTMENT_OTHER): Admitting: Psychology

## 2017-12-05 ENCOUNTER — Encounter (HOSPITAL_COMMUNITY): Payer: Self-pay | Admitting: Psychology

## 2017-12-05 DIAGNOSIS — Z96652 Presence of left artificial knee joint: Secondary | ICD-10-CM | POA: Diagnosis not present

## 2017-12-05 DIAGNOSIS — T7432XS Child psychological abuse, confirmed, sequela: Secondary | ICD-10-CM

## 2017-12-05 DIAGNOSIS — Z9884 Bariatric surgery status: Secondary | ICD-10-CM | POA: Diagnosis not present

## 2017-12-05 DIAGNOSIS — G894 Chronic pain syndrome: Secondary | ICD-10-CM | POA: Diagnosis not present

## 2017-12-05 DIAGNOSIS — F1020 Alcohol dependence, uncomplicated: Secondary | ICD-10-CM

## 2017-12-05 DIAGNOSIS — F4310 Post-traumatic stress disorder, unspecified: Secondary | ICD-10-CM

## 2017-12-05 DIAGNOSIS — F4321 Adjustment disorder with depressed mood: Secondary | ICD-10-CM | POA: Diagnosis not present

## 2017-12-05 DIAGNOSIS — F4381 Prolonged grief disorder: Secondary | ICD-10-CM

## 2017-12-05 DIAGNOSIS — M0579 Rheumatoid arthritis with rheumatoid factor of multiple sites without organ or systems involvement: Secondary | ICD-10-CM | POA: Diagnosis not present
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
## 2017-12-05 NOTE — Progress Notes (Signed)
    Daily Group Progress Note  Program: CD-IOP   12/05/2017 Jasmine Richard [id number]  Diagnosis:  Alcohol use disorder, severe, dependence ([place])  Chronic anxiety  Post traumatic stress disorder (PTSD)   Sobriety Date: 10/8  Group Time: 1-2:30  Participation Level: Active  Behavioral Response: Appropriate and Sharing  Type of Therapy: Process Group  Interventions: CBT  Topic:  Pts were active and engaged in process session in which pts shared about their weeks and utilization of coping skills. Pts were directed to discuss their tx goals. Pts shared about topics of recovery, 12 steps, and sobriety.       Group Time: 2:30-4  Participation Level: Active  Behavioral Response: Appropriate and Sharing  Type of Therapy: Psycho-education Group  Interventions: Other: Chair Yoga  Topic: Pts were active and engaged in psychoeducation session in which a guest counselor led a 1 hr therapeutic "chair yoga" session w/ instruction for how to implement practice into daily routines.    Summary:  Pt was active and engaged in session. She reported she attended 2 AA meetings over the weekend. She reported she was feeling happy and "staying on track w/ her personal goals". Pt stated she went to a new women's meeting and met some new people in recovery and exchanged numbers. Pt expressed some bitterness towards her daughter-in-law since "she has not forgiven me yet". Counselor asked if pt had expressed her feelings to daughter, to which pt replied "no". Pt discussed her frustration that the car seat for her grandson is not yet installed in her car bc daughter-in-law does not "feel it's time yet" (pt interprets this to mean she is not stable enough in recovery). Counselor helped group provide feedback for how to proceed w/ car seat situation. Pt participated actively in yoga despite having recently undergone knee replacement surgery.   UDS collected: No Results: negative  AA/NA  attended?: YesSaturday and Sunday  Sponsor?: Yes   Jasmine Richard, LPCA LCASA 12/05/2017 9:46 AM

## 2017-12-06 ENCOUNTER — Other Ambulatory Visit (HOSPITAL_COMMUNITY): Payer: Self-pay

## 2017-12-06 ENCOUNTER — Other Ambulatory Visit (HOSPITAL_COMMUNITY): Admitting: Psychology

## 2017-12-06 DIAGNOSIS — F1020 Alcohol dependence, uncomplicated: Secondary | ICD-10-CM

## 2017-12-06 NOTE — Progress Notes (Signed)
Called [facility] in response to their request to speak to provider about Lrica Rx-they were needing Dr Remus Blake NPI for pt's Lyrica RX.NPI was provided
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
## 2017-12-10 ENCOUNTER — Other Ambulatory Visit (HOSPITAL_COMMUNITY)

## 2017-12-11 ENCOUNTER — Encounter (HOSPITAL_COMMUNITY): Payer: Self-pay | Admitting: Psychology
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
## 2017-12-12 ENCOUNTER — Other Ambulatory Visit (HOSPITAL_COMMUNITY)

## 2017-12-13 ENCOUNTER — Other Ambulatory Visit (HOSPITAL_COMMUNITY)

## 2017-12-19 ENCOUNTER — Other Ambulatory Visit (HOSPITAL_COMMUNITY): Payer: Self-pay | Admitting: Medical

## 2017-12-19 ENCOUNTER — Ambulatory Visit (INDEPENDENT_AMBULATORY_CARE_PROVIDER_SITE_OTHER): Admitting: Licensed Clinical Social Worker

## 2017-12-19 DIAGNOSIS — F4321 Adjustment disorder with depressed mood: Secondary | ICD-10-CM | POA: Diagnosis not present

## 2017-12-19 DIAGNOSIS — F1020 Alcohol dependence, uncomplicated: Secondary | ICD-10-CM | POA: Diagnosis not present

## 2017-12-24 ENCOUNTER — Encounter (HOSPITAL_COMMUNITY): Payer: Self-pay | Admitting: Licensed Clinical Social Worker

## 2017-12-24 NOTE — Progress Notes (Signed)
  Weekly Group Progress Note  Program: OUTPATIENT SKILLS GROUP  Group Time: 5:30-6:30pm  Participation Level: Active  Behavioral Response: Appropriate and Sharing  Type of Therapy:  Psycho-education Group  Skills discussed: Challenging cognitive "myths", DBT   Summary of Progress: Pt was active, engaged, and upbeat for her first group. She spoke openly of her recent AA attendance and vocalized support of other group members. Pt denies any concerns at the time.  Summary of Group:  Pts were active and engaged for group discussion of individual challenges and tx goals. One new pt was present following her successful d/c from CD-IOP. Pts filled out a DBT handout on "challenging myths" and learning the language of disputing irrational beliefs.   Margo Common, LPCA, LCASA

## 2017-12-25 ENCOUNTER — Ambulatory Visit (HOSPITAL_COMMUNITY): Payer: Self-pay | Admitting: Licensed Clinical Social Worker

## 2017-12-26 ENCOUNTER — Ambulatory Visit (HOSPITAL_COMMUNITY): Payer: Self-pay | Admitting: Licensed Clinical Social Worker

## 2018-01-01 ENCOUNTER — Encounter: Payer: Self-pay | Admitting: Obstetrics & Gynecology

## 2018-01-01 ENCOUNTER — Ambulatory Visit (HOSPITAL_COMMUNITY): Payer: Self-pay | Admitting: Licensed Clinical Social Worker

## 2018-01-08 ENCOUNTER — Ambulatory Visit (HOSPITAL_COMMUNITY): Payer: Self-pay | Admitting: Licensed Clinical Social Worker

## 2018-01-15 ENCOUNTER — Ambulatory Visit (HOSPITAL_COMMUNITY): Payer: Self-pay | Admitting: Licensed Clinical Social Worker

## 2018-01-22 ENCOUNTER — Ambulatory Visit (HOSPITAL_COMMUNITY): Payer: Self-pay | Admitting: Licensed Clinical Social Worker

## 2018-01-25 DEATH — deceased

## 2018-01-29 ENCOUNTER — Ambulatory Visit (HOSPITAL_COMMUNITY): Payer: Self-pay | Admitting: Licensed Clinical Social Worker

## 2018-02-05 ENCOUNTER — Ambulatory Visit (HOSPITAL_COMMUNITY): Payer: Self-pay | Admitting: Licensed Clinical Social Worker

## 2018-02-12 ENCOUNTER — Ambulatory Visit (HOSPITAL_COMMUNITY): Payer: Self-pay | Admitting: Licensed Clinical Social Worker

## 2018-02-19 ENCOUNTER — Ambulatory Visit (HOSPITAL_COMMUNITY): Payer: Self-pay | Admitting: Licensed Clinical Social Worker

## 2018-02-26 ENCOUNTER — Ambulatory Visit (HOSPITAL_COMMUNITY): Payer: Self-pay | Admitting: Licensed Clinical Social Worker

## 2018-03-05 ENCOUNTER — Ambulatory Visit (HOSPITAL_COMMUNITY): Payer: Self-pay | Admitting: Licensed Clinical Social Worker

## 2018-03-12 ENCOUNTER — Ambulatory Visit (HOSPITAL_COMMUNITY): Payer: Self-pay | Admitting: Licensed Clinical Social Worker

## 2018-03-19 ENCOUNTER — Ambulatory Visit (HOSPITAL_COMMUNITY): Payer: Self-pay | Admitting: Licensed Clinical Social Worker

## 2018-03-26 ENCOUNTER — Ambulatory Visit (HOSPITAL_COMMUNITY): Payer: Self-pay | Admitting: Licensed Clinical Social Worker

## 2018-04-02 ENCOUNTER — Ambulatory Visit (HOSPITAL_COMMUNITY): Payer: Self-pay | Admitting: Licensed Clinical Social Worker

## 2018-09-10 ENCOUNTER — Telehealth: Payer: Self-pay | Admitting: *Deleted

## 2018-09-10 NOTE — Telephone Encounter (Signed)
Patient in 04 recall for 03/2018. Due for [facility].  [employer]. Patient had appt in May but canceled. Has no future appt scheduled.   Called patient. Number disconnected.   Routed to [facility] for recall letter.

## 2018-10-07 NOTE — Telephone Encounter (Signed)
Recalls completed. Encounter closed.  

## 2018-10-07 NOTE — Telephone Encounter (Signed)
Attempt to call patient. Female answers and states we have the wrong number for "[name]."  Last office note from 05-24-16 annual exam with Shirlyn Goltz FNP indicates family stressors with adult children.    Last ROI is from 2017.  Proceed with [facility] recall letter?

## 2018-10-07 NOTE — Telephone Encounter (Signed)
Mrs. Kunkler died 31-Dec-2017.  Ok to removal from all recall.  Cancel any appts.  Please make appropriate notation on chart as well.  Obituary printed.  Will also CC to Starla Curl.
